# Patient Record
Sex: Female | Born: 1935
Health system: Southern US, Community
[De-identification: ages and names within clinical notes are randomized; demographics above are authoritative.]

## PROBLEM LIST (undated history)

## (undated) DIAGNOSIS — M353 Polymyalgia rheumatica: Secondary | ICD-10-CM

## (undated) DIAGNOSIS — IMO0001 Reserved for inherently not codable concepts without codable children: Secondary | ICD-10-CM

## (undated) DIAGNOSIS — I480 Paroxysmal atrial fibrillation: Secondary | ICD-10-CM

## (undated) DIAGNOSIS — I2510 Atherosclerotic heart disease of native coronary artery without angina pectoris: Secondary | ICD-10-CM

## (undated) DIAGNOSIS — D696 Thrombocytopenia, unspecified: Secondary | ICD-10-CM

## (undated) DIAGNOSIS — M4800 Spinal stenosis, site unspecified: Secondary | ICD-10-CM

## (undated) DIAGNOSIS — I10 Essential (primary) hypertension: Secondary | ICD-10-CM

## (undated) DIAGNOSIS — N952 Postmenopausal atrophic vaginitis: Secondary | ICD-10-CM

## (undated) DIAGNOSIS — M1990 Unspecified osteoarthritis, unspecified site: Secondary | ICD-10-CM

## (undated) DIAGNOSIS — I739 Peripheral vascular disease, unspecified: Secondary | ICD-10-CM

## (undated) DIAGNOSIS — K581 Irritable bowel syndrome with constipation: Secondary | ICD-10-CM

## (undated) DIAGNOSIS — F439 Reaction to severe stress, unspecified: Secondary | ICD-10-CM

## (undated) DIAGNOSIS — R413 Other amnesia: Secondary | ICD-10-CM

## (undated) DIAGNOSIS — E785 Hyperlipidemia, unspecified: Secondary | ICD-10-CM

## (undated) DIAGNOSIS — K219 Gastro-esophageal reflux disease without esophagitis: Secondary | ICD-10-CM

## (undated) DIAGNOSIS — M8580 Other specified disorders of bone density and structure, unspecified site: Secondary | ICD-10-CM

## (undated) DIAGNOSIS — I779 Disorder of arteries and arterioles, unspecified: Secondary | ICD-10-CM

## (undated) DIAGNOSIS — N840 Polyp of corpus uteri: Secondary | ICD-10-CM

## (undated) DIAGNOSIS — R002 Palpitations: Secondary | ICD-10-CM

## (undated) DIAGNOSIS — F419 Anxiety disorder, unspecified: Secondary | ICD-10-CM

## (undated) HISTORY — PX: BREAST SURGERY: SHX581

## (undated) HISTORY — DX: Polyp of corpus uteri: N84.0

## (undated) HISTORY — DX: Essential (primary) hypertension: I10

## (undated) HISTORY — DX: Unspecified osteoarthritis, unspecified site: M19.90

## (undated) HISTORY — DX: Postmenopausal atrophic vaginitis: N95.2

## (undated) HISTORY — PX: TONSILLECTOMY: SUR1361

## (undated) HISTORY — DX: Anxiety disorder, unspecified: F41.9

## (undated) HISTORY — DX: Palpitations: R00.2

## (undated) HISTORY — DX: Hyperlipidemia, unspecified: E78.5

## (undated) HISTORY — DX: Other specified disorders of bone density and structure, unspecified site: M85.80

## (undated) HISTORY — DX: Reserved for inherently not codable concepts without codable children: IMO0001

## (undated) HISTORY — DX: Gastro-esophageal reflux disease without esophagitis: K21.9

## (undated) HISTORY — DX: Atherosclerotic heart disease of native coronary artery without angina pectoris: I25.10

## (undated) HISTORY — DX: Spinal stenosis, site unspecified: M48.00

## (undated) HISTORY — PX: DILATION AND CURETTAGE OF UTERUS: SHX78

## (undated) HISTORY — DX: Thrombocytopenia, unspecified: D69.6

## (undated) HISTORY — DX: Polymyalgia rheumatica: M35.3

## (undated) HISTORY — DX: Paroxysmal atrial fibrillation: I48.0

## (undated) HISTORY — DX: Reaction to severe stress, unspecified: F43.9

## (undated) HISTORY — DX: Irritable bowel syndrome with constipation: K58.1

## (undated) HISTORY — DX: Peripheral vascular disease, unspecified: I73.9

## (undated) HISTORY — DX: Disorder of arteries and arterioles, unspecified: I77.9

---

## 1998-01-11 ENCOUNTER — Other Ambulatory Visit: Admission: RE | Admit: 1998-01-11 | Discharge: 1998-01-11 | Payer: Self-pay | Admitting: Obstetrics and Gynecology

## 1998-02-17 ENCOUNTER — Other Ambulatory Visit: Admission: RE | Admit: 1998-02-17 | Discharge: 1998-02-17 | Payer: Self-pay | Admitting: Obstetrics and Gynecology

## 1998-02-18 ENCOUNTER — Other Ambulatory Visit: Admission: RE | Admit: 1998-02-18 | Discharge: 1998-02-18 | Payer: Self-pay | Admitting: Obstetrics and Gynecology

## 1998-03-13 HISTORY — PX: EXCISION MORTON'S NEUROMA: SHX5013

## 1998-04-19 ENCOUNTER — Encounter: Admission: RE | Admit: 1998-04-19 | Discharge: 1998-04-19 | Payer: Self-pay | Admitting: Sports Medicine

## 1998-05-07 ENCOUNTER — Ambulatory Visit (HOSPITAL_COMMUNITY): Admission: RE | Admit: 1998-05-07 | Discharge: 1998-05-07 | Payer: Self-pay | Admitting: Obstetrics & Gynecology

## 1998-05-13 ENCOUNTER — Other Ambulatory Visit: Admission: RE | Admit: 1998-05-13 | Discharge: 1998-05-13 | Payer: Self-pay | Admitting: Obstetrics and Gynecology

## 1998-05-21 ENCOUNTER — Encounter: Admission: RE | Admit: 1998-05-21 | Discharge: 1998-05-21 | Payer: Self-pay | Admitting: Sports Medicine

## 1998-06-04 ENCOUNTER — Encounter: Admission: RE | Admit: 1998-06-04 | Discharge: 1998-06-04 | Payer: Self-pay | Admitting: Sports Medicine

## 1998-07-05 ENCOUNTER — Other Ambulatory Visit: Admission: RE | Admit: 1998-07-05 | Discharge: 1998-07-05 | Payer: Self-pay | Admitting: Orthopedic Surgery

## 1998-07-19 ENCOUNTER — Ambulatory Visit (HOSPITAL_COMMUNITY): Admission: RE | Admit: 1998-07-19 | Discharge: 1998-07-19 | Payer: Self-pay | Admitting: *Deleted

## 1998-08-03 ENCOUNTER — Encounter: Admission: RE | Admit: 1998-08-03 | Discharge: 1998-08-03 | Payer: Self-pay | Admitting: Sports Medicine

## 1998-09-29 ENCOUNTER — Ambulatory Visit (HOSPITAL_COMMUNITY): Admission: RE | Admit: 1998-09-29 | Discharge: 1998-09-29 | Payer: Self-pay | Admitting: Sports Medicine

## 1998-09-29 ENCOUNTER — Encounter: Payer: Self-pay | Admitting: Sports Medicine

## 1998-10-28 ENCOUNTER — Encounter: Payer: Self-pay | Admitting: Emergency Medicine

## 1998-10-28 ENCOUNTER — Emergency Department (HOSPITAL_COMMUNITY): Admission: EM | Admit: 1998-10-28 | Discharge: 1998-10-28 | Payer: Self-pay | Admitting: Emergency Medicine

## 1999-01-08 ENCOUNTER — Emergency Department (HOSPITAL_COMMUNITY): Admission: EM | Admit: 1999-01-08 | Discharge: 1999-01-08 | Payer: Self-pay | Admitting: Internal Medicine

## 1999-01-20 ENCOUNTER — Other Ambulatory Visit: Admission: RE | Admit: 1999-01-20 | Discharge: 1999-01-20 | Payer: Self-pay | Admitting: Obstetrics and Gynecology

## 1999-04-12 ENCOUNTER — Encounter: Admission: RE | Admit: 1999-04-12 | Discharge: 1999-04-12 | Payer: Self-pay | Admitting: Family Medicine

## 1999-09-20 ENCOUNTER — Ambulatory Visit (HOSPITAL_COMMUNITY): Admission: RE | Admit: 1999-09-20 | Discharge: 1999-09-20 | Payer: Self-pay | Admitting: Orthopedic Surgery

## 1999-09-20 ENCOUNTER — Encounter: Payer: Self-pay | Admitting: Orthopedic Surgery

## 1999-10-12 ENCOUNTER — Ambulatory Visit (HOSPITAL_COMMUNITY): Admission: RE | Admit: 1999-10-12 | Discharge: 1999-10-12 | Payer: Self-pay | Admitting: Orthopedic Surgery

## 1999-10-12 ENCOUNTER — Encounter: Payer: Self-pay | Admitting: Orthopedic Surgery

## 1999-10-26 ENCOUNTER — Encounter: Payer: Self-pay | Admitting: Orthopedic Surgery

## 1999-10-26 ENCOUNTER — Ambulatory Visit (HOSPITAL_COMMUNITY): Admission: RE | Admit: 1999-10-26 | Discharge: 1999-10-26 | Payer: Self-pay | Admitting: Orthopedic Surgery

## 2000-01-19 ENCOUNTER — Ambulatory Visit (HOSPITAL_COMMUNITY): Admission: RE | Admit: 2000-01-19 | Discharge: 2000-01-19 | Payer: Self-pay | Admitting: Neurosurgery

## 2000-01-26 ENCOUNTER — Other Ambulatory Visit: Admission: RE | Admit: 2000-01-26 | Discharge: 2000-01-26 | Payer: Self-pay | Admitting: Obstetrics and Gynecology

## 2000-04-13 ENCOUNTER — Encounter: Admission: RE | Admit: 2000-04-13 | Discharge: 2000-04-13 | Payer: Self-pay | Admitting: Family Medicine

## 2000-05-11 ENCOUNTER — Encounter: Admission: RE | Admit: 2000-05-11 | Discharge: 2000-05-11 | Payer: Self-pay | Admitting: Family Medicine

## 2000-05-11 ENCOUNTER — Encounter: Admission: RE | Admit: 2000-05-11 | Discharge: 2000-05-11 | Payer: Self-pay | Admitting: Sports Medicine

## 2000-06-29 ENCOUNTER — Encounter: Admission: RE | Admit: 2000-06-29 | Discharge: 2000-06-29 | Payer: Self-pay | Admitting: Sports Medicine

## 2000-08-10 ENCOUNTER — Encounter: Admission: RE | Admit: 2000-08-10 | Discharge: 2000-08-10 | Payer: Self-pay | Admitting: Sports Medicine

## 2000-10-09 ENCOUNTER — Encounter: Admission: RE | Admit: 2000-10-09 | Discharge: 2000-10-09 | Payer: Self-pay | Admitting: Family Medicine

## 2000-11-09 ENCOUNTER — Encounter: Admission: RE | Admit: 2000-11-09 | Discharge: 2000-11-09 | Payer: Self-pay | Admitting: Sports Medicine

## 2000-12-28 ENCOUNTER — Encounter: Admission: RE | Admit: 2000-12-28 | Discharge: 2000-12-28 | Payer: Self-pay | Admitting: Sports Medicine

## 2001-01-28 ENCOUNTER — Other Ambulatory Visit: Admission: RE | Admit: 2001-01-28 | Discharge: 2001-01-28 | Payer: Self-pay | Admitting: Obstetrics and Gynecology

## 2001-05-28 ENCOUNTER — Encounter: Admission: RE | Admit: 2001-05-28 | Discharge: 2001-05-28 | Payer: Self-pay | Admitting: Sports Medicine

## 2001-07-09 ENCOUNTER — Encounter: Admission: RE | Admit: 2001-07-09 | Discharge: 2001-07-09 | Payer: Self-pay | Admitting: Sports Medicine

## 2001-11-08 ENCOUNTER — Encounter: Admission: RE | Admit: 2001-11-08 | Discharge: 2001-11-08 | Payer: Self-pay | Admitting: Sports Medicine

## 2002-02-05 ENCOUNTER — Other Ambulatory Visit: Admission: RE | Admit: 2002-02-05 | Discharge: 2002-02-05 | Payer: Self-pay | Admitting: Obstetrics and Gynecology

## 2003-03-03 ENCOUNTER — Encounter: Admission: RE | Admit: 2003-03-03 | Discharge: 2003-03-03 | Payer: Self-pay | Admitting: Gastroenterology

## 2003-03-14 HISTORY — PX: HIP SURGERY: SHX245

## 2003-04-14 ENCOUNTER — Other Ambulatory Visit: Admission: RE | Admit: 2003-04-14 | Discharge: 2003-04-14 | Payer: Self-pay | Admitting: Obstetrics and Gynecology

## 2003-04-25 ENCOUNTER — Encounter: Admission: RE | Admit: 2003-04-25 | Discharge: 2003-04-25 | Payer: Self-pay | Admitting: Orthopedic Surgery

## 2003-06-17 ENCOUNTER — Observation Stay (HOSPITAL_COMMUNITY): Admission: RE | Admit: 2003-06-17 | Discharge: 2003-06-18 | Payer: Self-pay | Admitting: Orthopedic Surgery

## 2004-01-15 ENCOUNTER — Ambulatory Visit: Payer: Self-pay | Admitting: Cardiology

## 2004-01-26 ENCOUNTER — Ambulatory Visit: Payer: Self-pay | Admitting: Cardiology

## 2004-02-03 ENCOUNTER — Ambulatory Visit: Payer: Self-pay | Admitting: Cardiology

## 2004-02-12 ENCOUNTER — Ambulatory Visit: Payer: Self-pay | Admitting: Cardiology

## 2004-03-04 ENCOUNTER — Ambulatory Visit: Payer: Self-pay | Admitting: Cardiology

## 2004-03-08 ENCOUNTER — Ambulatory Visit: Payer: Self-pay | Admitting: Cardiology

## 2004-03-17 ENCOUNTER — Ambulatory Visit: Payer: Self-pay | Admitting: Internal Medicine

## 2004-03-28 ENCOUNTER — Ambulatory Visit: Payer: Self-pay | Admitting: *Deleted

## 2004-04-20 ENCOUNTER — Other Ambulatory Visit: Admission: RE | Admit: 2004-04-20 | Discharge: 2004-04-20 | Payer: Self-pay | Admitting: Obstetrics and Gynecology

## 2004-04-25 ENCOUNTER — Ambulatory Visit: Payer: Self-pay | Admitting: Cardiology

## 2004-05-05 ENCOUNTER — Ambulatory Visit: Payer: Self-pay | Admitting: Internal Medicine

## 2004-05-18 ENCOUNTER — Ambulatory Visit: Payer: Self-pay | Admitting: Cardiology

## 2004-06-01 ENCOUNTER — Ambulatory Visit: Payer: Self-pay | Admitting: Cardiology

## 2004-06-08 ENCOUNTER — Ambulatory Visit: Payer: Self-pay | Admitting: Cardiology

## 2004-06-09 ENCOUNTER — Ambulatory Visit: Payer: Self-pay | Admitting: Cardiology

## 2004-06-21 ENCOUNTER — Ambulatory Visit: Payer: Self-pay | Admitting: Cardiology

## 2004-06-29 ENCOUNTER — Ambulatory Visit: Payer: Self-pay | Admitting: Internal Medicine

## 2004-07-13 ENCOUNTER — Ambulatory Visit: Payer: Self-pay | Admitting: *Deleted

## 2004-07-28 ENCOUNTER — Ambulatory Visit: Payer: Self-pay | Admitting: Cardiology

## 2004-08-11 ENCOUNTER — Ambulatory Visit: Payer: Self-pay | Admitting: Internal Medicine

## 2004-08-24 ENCOUNTER — Ambulatory Visit: Payer: Self-pay | Admitting: *Deleted

## 2004-09-07 ENCOUNTER — Ambulatory Visit: Payer: Self-pay | Admitting: Cardiovascular Disease

## 2004-09-16 ENCOUNTER — Ambulatory Visit: Payer: Self-pay | Admitting: Cardiology

## 2004-10-03 ENCOUNTER — Ambulatory Visit: Payer: Self-pay | Admitting: Internal Medicine

## 2004-10-15 ENCOUNTER — Emergency Department (HOSPITAL_COMMUNITY): Admission: EM | Admit: 2004-10-15 | Discharge: 2004-10-15 | Payer: Self-pay | Admitting: Emergency Medicine

## 2004-10-24 ENCOUNTER — Ambulatory Visit: Payer: Self-pay | Admitting: Internal Medicine

## 2004-11-16 ENCOUNTER — Ambulatory Visit: Payer: Self-pay | Admitting: Cardiology

## 2004-11-29 ENCOUNTER — Encounter: Admission: RE | Admit: 2004-11-29 | Discharge: 2004-12-26 | Payer: Self-pay | Admitting: *Deleted

## 2004-11-30 ENCOUNTER — Ambulatory Visit: Payer: Self-pay | Admitting: Cardiology

## 2004-12-21 ENCOUNTER — Ambulatory Visit: Payer: Self-pay | Admitting: Cardiology

## 2004-12-28 ENCOUNTER — Ambulatory Visit: Payer: Self-pay | Admitting: Cardiovascular Disease

## 2005-01-12 ENCOUNTER — Ambulatory Visit: Payer: Self-pay | Admitting: Cardiology

## 2005-02-07 ENCOUNTER — Ambulatory Visit (HOSPITAL_COMMUNITY): Admission: RE | Admit: 2005-02-07 | Discharge: 2005-02-07 | Payer: Self-pay | Admitting: Obstetrics and Gynecology

## 2005-02-07 ENCOUNTER — Ambulatory Visit (HOSPITAL_BASED_OUTPATIENT_CLINIC_OR_DEPARTMENT_OTHER): Admission: RE | Admit: 2005-02-07 | Discharge: 2005-02-07 | Payer: Self-pay | Admitting: Obstetrics and Gynecology

## 2005-02-07 ENCOUNTER — Encounter (INDEPENDENT_AMBULATORY_CARE_PROVIDER_SITE_OTHER): Payer: Self-pay | Admitting: *Deleted

## 2005-02-07 HISTORY — PX: HYSTEROSCOPY: SHX211

## 2005-02-14 ENCOUNTER — Ambulatory Visit: Payer: Self-pay | Admitting: Cardiovascular Disease

## 2005-02-21 ENCOUNTER — Ambulatory Visit: Payer: Self-pay | Admitting: Cardiology

## 2005-03-07 ENCOUNTER — Ambulatory Visit: Payer: Self-pay | Admitting: Internal Medicine

## 2005-03-21 ENCOUNTER — Ambulatory Visit: Payer: Self-pay | Admitting: Cardiology

## 2005-04-04 ENCOUNTER — Ambulatory Visit: Payer: Self-pay | Admitting: Cardiology

## 2005-04-18 ENCOUNTER — Ambulatory Visit: Payer: Self-pay | Admitting: Internal Medicine

## 2005-05-02 ENCOUNTER — Ambulatory Visit: Payer: Self-pay | Admitting: Cardiology

## 2005-05-16 ENCOUNTER — Ambulatory Visit: Payer: Self-pay | Admitting: Cardiology

## 2005-06-06 ENCOUNTER — Ambulatory Visit: Payer: Self-pay | Admitting: Cardiology

## 2005-06-27 ENCOUNTER — Ambulatory Visit: Payer: Self-pay | Admitting: Cardiology

## 2005-07-18 ENCOUNTER — Ambulatory Visit: Payer: Self-pay | Admitting: Cardiology

## 2005-08-01 ENCOUNTER — Ambulatory Visit: Payer: Self-pay | Admitting: *Deleted

## 2005-08-22 ENCOUNTER — Ambulatory Visit: Payer: Self-pay | Admitting: *Deleted

## 2005-09-05 ENCOUNTER — Ambulatory Visit: Payer: Self-pay | Admitting: Cardiology

## 2005-09-22 ENCOUNTER — Ambulatory Visit: Payer: Self-pay | Admitting: Internal Medicine

## 2005-10-20 ENCOUNTER — Ambulatory Visit: Payer: Self-pay | Admitting: Internal Medicine

## 2005-11-17 ENCOUNTER — Ambulatory Visit: Payer: Self-pay | Admitting: Cardiology

## 2005-11-27 ENCOUNTER — Ambulatory Visit: Payer: Self-pay | Admitting: Cardiovascular Disease

## 2005-12-12 ENCOUNTER — Ambulatory Visit: Payer: Self-pay | Admitting: Cardiovascular Disease

## 2005-12-14 ENCOUNTER — Ambulatory Visit: Payer: Self-pay | Admitting: Cardiology

## 2005-12-22 ENCOUNTER — Ambulatory Visit: Payer: Self-pay | Admitting: Cardiology

## 2006-01-05 ENCOUNTER — Ambulatory Visit: Payer: Self-pay | Admitting: Internal Medicine

## 2006-01-19 ENCOUNTER — Ambulatory Visit: Payer: Self-pay | Admitting: Cardiology

## 2006-02-07 ENCOUNTER — Ambulatory Visit: Payer: Self-pay | Admitting: Cardiovascular Disease

## 2006-03-08 ENCOUNTER — Ambulatory Visit: Payer: Self-pay | Admitting: Internal Medicine

## 2006-03-22 ENCOUNTER — Ambulatory Visit: Payer: Self-pay | Admitting: Cardiology

## 2006-04-10 ENCOUNTER — Ambulatory Visit: Payer: Self-pay | Admitting: Cardiology

## 2006-04-30 ENCOUNTER — Other Ambulatory Visit: Admission: RE | Admit: 2006-04-30 | Discharge: 2006-04-30 | Payer: Self-pay | Admitting: Obstetrics and Gynecology

## 2006-05-08 ENCOUNTER — Ambulatory Visit: Payer: Self-pay | Admitting: Internal Medicine

## 2006-05-17 ENCOUNTER — Ambulatory Visit: Payer: Self-pay | Admitting: Cardiology

## 2006-05-31 ENCOUNTER — Ambulatory Visit: Payer: Self-pay | Admitting: Cardiology

## 2006-06-15 ENCOUNTER — Ambulatory Visit: Payer: Self-pay | Admitting: Cardiology

## 2006-07-12 ENCOUNTER — Ambulatory Visit: Payer: Self-pay | Admitting: Internal Medicine

## 2006-08-09 ENCOUNTER — Ambulatory Visit: Payer: Self-pay | Admitting: Cardiology

## 2006-08-22 ENCOUNTER — Ambulatory Visit: Payer: Self-pay | Admitting: Cardiology

## 2006-09-06 ENCOUNTER — Ambulatory Visit: Payer: Self-pay | Admitting: Cardiology

## 2006-09-20 ENCOUNTER — Ambulatory Visit: Payer: Self-pay | Admitting: Internal Medicine

## 2006-09-23 ENCOUNTER — Emergency Department (HOSPITAL_COMMUNITY): Admission: EM | Admit: 2006-09-23 | Discharge: 2006-09-23 | Payer: Self-pay | Admitting: Emergency Medicine

## 2006-10-02 ENCOUNTER — Telehealth: Payer: Self-pay | Admitting: *Deleted

## 2006-10-03 ENCOUNTER — Encounter: Payer: Self-pay | Admitting: *Deleted

## 2006-10-03 ENCOUNTER — Encounter (INDEPENDENT_AMBULATORY_CARE_PROVIDER_SITE_OTHER): Payer: Self-pay | Admitting: *Deleted

## 2006-10-16 ENCOUNTER — Ambulatory Visit: Payer: Self-pay | Admitting: Cardiology

## 2006-10-16 LAB — CONVERTED CEMR LAB
INR: 6.4 (ref 0.9–2.0)
Prothrombin Time: 33.1 s (ref 10.0–14.0)

## 2006-10-23 ENCOUNTER — Ambulatory Visit: Payer: Self-pay | Admitting: Cardiology

## 2006-11-05 ENCOUNTER — Ambulatory Visit: Payer: Self-pay | Admitting: Cardiology

## 2006-11-25 ENCOUNTER — Emergency Department (HOSPITAL_COMMUNITY): Admission: EM | Admit: 2006-11-25 | Discharge: 2006-11-25 | Payer: Self-pay | Admitting: Emergency Medicine

## 2006-11-26 ENCOUNTER — Ambulatory Visit: Payer: Self-pay | Admitting: Internal Medicine

## 2006-11-28 ENCOUNTER — Inpatient Hospital Stay (HOSPITAL_COMMUNITY): Admission: AD | Admit: 2006-11-28 | Discharge: 2006-12-01 | Payer: Self-pay | Admitting: Cardiology

## 2006-11-28 ENCOUNTER — Ambulatory Visit: Payer: Self-pay | Admitting: Internal Medicine

## 2006-11-28 ENCOUNTER — Ambulatory Visit: Payer: Self-pay | Admitting: Cardiology

## 2006-11-29 ENCOUNTER — Encounter: Payer: Self-pay | Admitting: Cardiology

## 2006-12-05 ENCOUNTER — Ambulatory Visit: Payer: Self-pay | Admitting: Internal Medicine

## 2006-12-19 ENCOUNTER — Ambulatory Visit: Payer: Self-pay | Admitting: Internal Medicine

## 2006-12-27 ENCOUNTER — Ambulatory Visit (HOSPITAL_COMMUNITY): Admission: RE | Admit: 2006-12-27 | Discharge: 2006-12-27 | Payer: Self-pay | Admitting: Neurosurgery

## 2007-01-03 ENCOUNTER — Ambulatory Visit: Payer: Self-pay | Admitting: Cardiovascular Disease

## 2007-01-11 ENCOUNTER — Ambulatory Visit: Payer: Self-pay | Admitting: Cardiology

## 2007-02-01 ENCOUNTER — Ambulatory Visit: Payer: Self-pay | Admitting: Internal Medicine

## 2007-03-01 ENCOUNTER — Ambulatory Visit: Payer: Self-pay | Admitting: Internal Medicine

## 2007-03-14 HISTORY — PX: OTHER SURGICAL HISTORY: SHX169

## 2007-03-22 ENCOUNTER — Ambulatory Visit: Payer: Self-pay | Admitting: Cardiology

## 2007-04-03 ENCOUNTER — Encounter: Admission: RE | Admit: 2007-04-03 | Discharge: 2007-07-02 | Payer: Self-pay | Admitting: Family Medicine

## 2007-04-19 ENCOUNTER — Ambulatory Visit: Payer: Self-pay | Admitting: Internal Medicine

## 2007-05-09 ENCOUNTER — Ambulatory Visit: Payer: Self-pay | Admitting: Cardiology

## 2007-05-12 HISTORY — PX: LAMINECTOMY: SHX219

## 2007-05-16 ENCOUNTER — Ambulatory Visit: Payer: Self-pay | Admitting: Cardiovascular Disease

## 2007-05-29 ENCOUNTER — Inpatient Hospital Stay (HOSPITAL_COMMUNITY): Admission: RE | Admit: 2007-05-29 | Discharge: 2007-05-30 | Payer: Self-pay | Admitting: Neurosurgery

## 2007-06-01 ENCOUNTER — Emergency Department (HOSPITAL_COMMUNITY): Admission: EM | Admit: 2007-06-01 | Discharge: 2007-06-01 | Payer: Self-pay | Admitting: Emergency Medicine

## 2007-06-17 ENCOUNTER — Ambulatory Visit: Payer: Self-pay | Admitting: Internal Medicine

## 2007-06-27 ENCOUNTER — Ambulatory Visit: Payer: Self-pay | Admitting: Cardiology

## 2007-07-16 ENCOUNTER — Ambulatory Visit: Payer: Self-pay | Admitting: Cardiovascular Disease

## 2007-07-30 ENCOUNTER — Ambulatory Visit: Payer: Self-pay | Admitting: Internal Medicine

## 2007-08-13 ENCOUNTER — Encounter (INDEPENDENT_AMBULATORY_CARE_PROVIDER_SITE_OTHER): Payer: Self-pay | Admitting: Gastroenterology

## 2007-08-13 ENCOUNTER — Ambulatory Visit (HOSPITAL_COMMUNITY): Admission: RE | Admit: 2007-08-13 | Discharge: 2007-08-13 | Payer: Self-pay | Admitting: Gastroenterology

## 2007-08-20 ENCOUNTER — Ambulatory Visit: Payer: Self-pay | Admitting: Cardiovascular Disease

## 2007-08-30 ENCOUNTER — Ambulatory Visit: Payer: Self-pay | Admitting: Internal Medicine

## 2007-09-20 ENCOUNTER — Ambulatory Visit: Payer: Self-pay | Admitting: Cardiology

## 2007-10-18 ENCOUNTER — Ambulatory Visit: Payer: Self-pay | Admitting: Cardiology

## 2007-11-15 ENCOUNTER — Ambulatory Visit: Payer: Self-pay | Admitting: Cardiovascular Disease

## 2007-12-03 ENCOUNTER — Encounter: Admission: RE | Admit: 2007-12-03 | Discharge: 2008-01-21 | Payer: Self-pay | Admitting: Neurosurgery

## 2007-12-06 ENCOUNTER — Ambulatory Visit: Payer: Self-pay | Admitting: Cardiology

## 2007-12-30 ENCOUNTER — Ambulatory Visit: Payer: Self-pay | Admitting: Cardiovascular Disease

## 2008-01-24 ENCOUNTER — Encounter: Admission: RE | Admit: 2008-01-24 | Discharge: 2008-01-24 | Payer: Self-pay | Admitting: Gastroenterology

## 2008-01-27 ENCOUNTER — Ambulatory Visit: Payer: Self-pay | Admitting: Cardiology

## 2008-02-24 ENCOUNTER — Ambulatory Visit: Payer: Self-pay | Admitting: Internal Medicine

## 2008-03-23 ENCOUNTER — Ambulatory Visit: Payer: Self-pay | Admitting: Internal Medicine

## 2008-04-02 ENCOUNTER — Ambulatory Visit: Payer: Self-pay | Admitting: Cardiology

## 2008-04-08 ENCOUNTER — Ambulatory Visit: Payer: Self-pay

## 2008-04-18 ENCOUNTER — Ambulatory Visit: Payer: Self-pay | Admitting: Cardiovascular Disease

## 2008-04-18 ENCOUNTER — Observation Stay (HOSPITAL_COMMUNITY): Admission: EM | Admit: 2008-04-18 | Discharge: 2008-04-19 | Payer: Self-pay | Admitting: Emergency Medicine

## 2008-04-27 ENCOUNTER — Ambulatory Visit: Payer: Self-pay | Admitting: Cardiology

## 2008-04-28 ENCOUNTER — Ambulatory Visit: Payer: Self-pay | Admitting: Cardiology

## 2008-05-19 ENCOUNTER — Other Ambulatory Visit: Admission: RE | Admit: 2008-05-19 | Discharge: 2008-05-19 | Payer: Self-pay | Admitting: Obstetrics and Gynecology

## 2008-05-19 ENCOUNTER — Encounter: Payer: Self-pay | Admitting: Obstetrics and Gynecology

## 2008-05-19 ENCOUNTER — Ambulatory Visit: Payer: Self-pay | Admitting: Obstetrics and Gynecology

## 2008-05-25 ENCOUNTER — Ambulatory Visit: Payer: Self-pay | Admitting: Cardiovascular Disease

## 2008-06-15 ENCOUNTER — Ambulatory Visit: Payer: Self-pay | Admitting: Cardiology

## 2008-06-23 ENCOUNTER — Encounter: Payer: Self-pay | Admitting: Cardiology

## 2008-06-23 ENCOUNTER — Ambulatory Visit: Payer: Self-pay | Admitting: Cardiology

## 2008-07-13 ENCOUNTER — Ambulatory Visit: Payer: Self-pay | Admitting: Internal Medicine

## 2008-08-02 IMAGING — CR DG CHEST 2V
2 series · 2 of 2 positions shown · non-contrast
Comparison: none

CLINICAL DATA: Unstable angina.  Palpitations.
 CHEST - 2 VIEW:

[w chest pa]
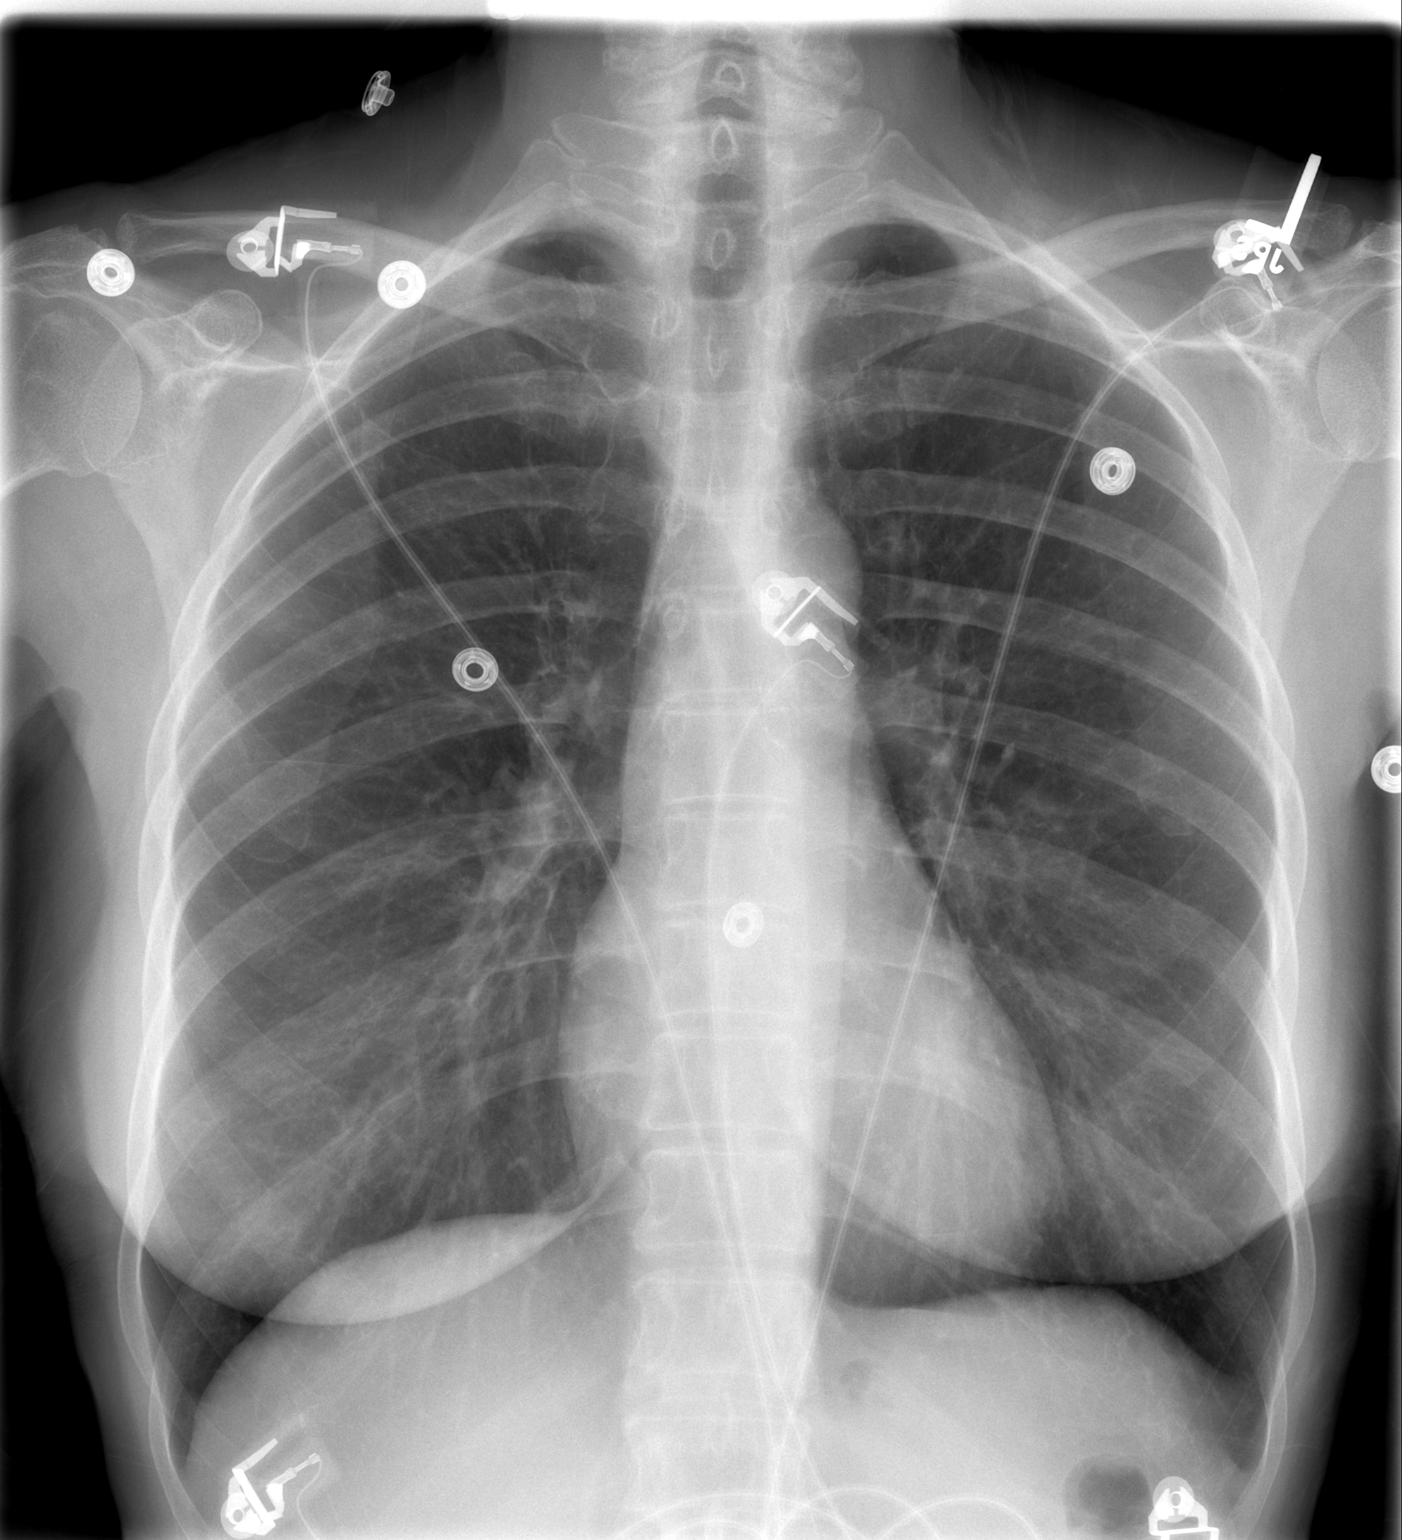

[w chest lat]
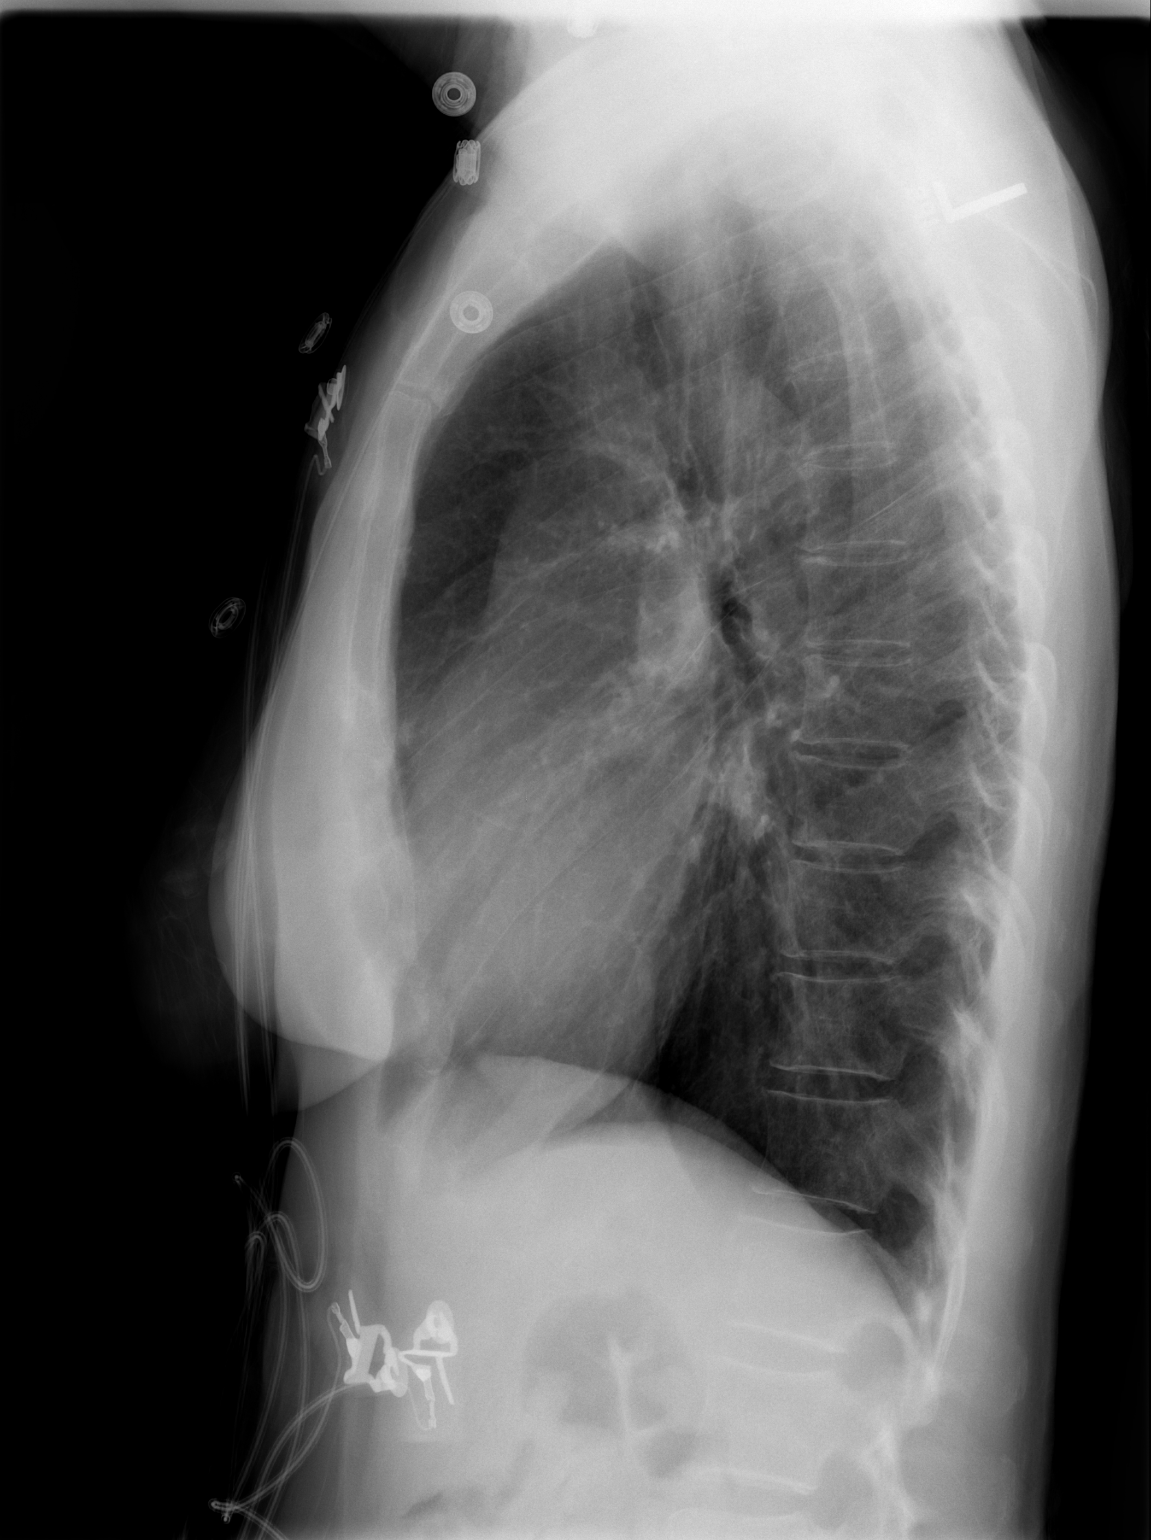

[2 of 2 positions shown; findings below may reference images not displayed]

FINDINGS: The heart size and mediastinal contours are within normal limits.  Both lungs are clear.  The visualized skeletal structures are unremarkable.
IMPRESSION: No active cardiopulmonary disease.

## 2008-08-11 ENCOUNTER — Encounter: Payer: Self-pay | Admitting: *Deleted

## 2008-08-11 ENCOUNTER — Ambulatory Visit: Payer: Self-pay | Admitting: Cardiovascular Disease

## 2008-08-11 LAB — CONVERTED CEMR LAB
POC INR: 1.5
Protime: 14.9

## 2008-08-19 ENCOUNTER — Ambulatory Visit: Payer: Self-pay | Admitting: Cardiology

## 2008-08-25 ENCOUNTER — Ambulatory Visit: Payer: Self-pay | Admitting: Internal Medicine

## 2008-08-25 ENCOUNTER — Encounter (INDEPENDENT_AMBULATORY_CARE_PROVIDER_SITE_OTHER): Payer: Self-pay | Admitting: Cardiology

## 2008-08-25 LAB — CONVERTED CEMR LAB
POC INR: 2.6
Protime: 19.5

## 2008-09-16 ENCOUNTER — Encounter: Payer: Self-pay | Admitting: *Deleted

## 2008-09-22 ENCOUNTER — Ambulatory Visit: Payer: Self-pay | Admitting: Cardiovascular Disease

## 2008-09-22 ENCOUNTER — Encounter (INDEPENDENT_AMBULATORY_CARE_PROVIDER_SITE_OTHER): Payer: Self-pay | Admitting: Cardiology

## 2008-09-22 LAB — CONVERTED CEMR LAB
POC INR: 1.2
Prothrombin Time: 13.6 s

## 2008-10-02 ENCOUNTER — Ambulatory Visit: Payer: Self-pay | Admitting: Internal Medicine

## 2008-10-02 LAB — CONVERTED CEMR LAB
POC INR: 3.4
Prothrombin Time: 22.3 s

## 2008-10-16 ENCOUNTER — Ambulatory Visit: Payer: Self-pay | Admitting: Cardiology

## 2008-10-16 LAB — CONVERTED CEMR LAB
POC INR: 2.4
Prothrombin Time: 19 s

## 2008-11-06 ENCOUNTER — Ambulatory Visit: Payer: Self-pay | Admitting: Internal Medicine

## 2008-11-06 LAB — CONVERTED CEMR LAB: POC INR: 2.5

## 2008-12-04 ENCOUNTER — Ambulatory Visit: Payer: Self-pay | Admitting: Internal Medicine

## 2008-12-04 LAB — CONVERTED CEMR LAB: POC INR: 2.8

## 2008-12-10 ENCOUNTER — Telehealth: Payer: Self-pay | Admitting: Internal Medicine

## 2009-01-04 ENCOUNTER — Ambulatory Visit: Payer: Self-pay | Admitting: Cardiology

## 2009-01-04 LAB — CONVERTED CEMR LAB: POC INR: 3.6

## 2009-01-25 ENCOUNTER — Ambulatory Visit: Payer: Self-pay | Admitting: Internal Medicine

## 2009-01-25 LAB — CONVERTED CEMR LAB: POC INR: 2.2

## 2009-02-17 ENCOUNTER — Ambulatory Visit: Payer: Self-pay | Admitting: Obstetrics and Gynecology

## 2009-02-22 ENCOUNTER — Ambulatory Visit: Payer: Self-pay | Admitting: Cardiology

## 2009-02-22 LAB — CONVERTED CEMR LAB: POC INR: 2

## 2009-03-22 ENCOUNTER — Ambulatory Visit: Payer: Self-pay | Admitting: Cardiology

## 2009-03-22 LAB — CONVERTED CEMR LAB: POC INR: 2.5

## 2009-04-19 ENCOUNTER — Ambulatory Visit: Payer: Self-pay | Admitting: Cardiology

## 2009-04-19 LAB — CONVERTED CEMR LAB: POC INR: 4.1

## 2009-05-03 ENCOUNTER — Ambulatory Visit: Payer: Self-pay | Admitting: Cardiovascular Disease

## 2009-05-03 ENCOUNTER — Encounter (INDEPENDENT_AMBULATORY_CARE_PROVIDER_SITE_OTHER): Payer: Self-pay | Admitting: Cardiology

## 2009-05-03 LAB — CONVERTED CEMR LAB: POC INR: 3.4

## 2009-05-20 ENCOUNTER — Ambulatory Visit: Payer: Self-pay | Admitting: Obstetrics and Gynecology

## 2009-05-31 ENCOUNTER — Ambulatory Visit: Payer: Self-pay | Admitting: Internal Medicine

## 2009-05-31 LAB — CONVERTED CEMR LAB: POC INR: 3.7

## 2009-06-21 ENCOUNTER — Ambulatory Visit: Payer: Self-pay | Admitting: Internal Medicine

## 2009-06-21 LAB — CONVERTED CEMR LAB: POC INR: 1.5

## 2009-07-05 ENCOUNTER — Ambulatory Visit: Payer: Self-pay | Admitting: Cardiology

## 2009-07-05 LAB — CONVERTED CEMR LAB: POC INR: 3

## 2009-07-26 ENCOUNTER — Ambulatory Visit: Payer: Self-pay | Admitting: Internal Medicine

## 2009-07-26 LAB — CONVERTED CEMR LAB: POC INR: 3.1

## 2009-08-17 ENCOUNTER — Ambulatory Visit: Payer: Self-pay | Admitting: Cardiology

## 2009-08-17 LAB — CONVERTED CEMR LAB: POC INR: 2

## 2009-08-25 ENCOUNTER — Encounter: Payer: Self-pay | Admitting: Cardiology

## 2009-08-26 ENCOUNTER — Ambulatory Visit: Payer: Self-pay | Admitting: Cardiology

## 2009-08-30 ENCOUNTER — Telehealth: Payer: Self-pay | Admitting: Cardiology

## 2009-08-30 LAB — CONVERTED CEMR LAB
Cholesterol: 212 mg/dL — ABNORMAL HIGH (ref 0–200)
Direct LDL: 119.7 mg/dL
HDL: 59 mg/dL (ref >39.00)
Total CHOL/HDL Ratio: 4
Triglycerides: 55 mg/dL (ref 0.0–149.0)
VLDL: 11 mg/dL (ref 0.0–40.0)

## 2009-08-31 ENCOUNTER — Ambulatory Visit: Payer: Self-pay | Admitting: Internal Medicine

## 2009-08-31 LAB — CONVERTED CEMR LAB: POC INR: 3.1

## 2009-09-17 ENCOUNTER — Encounter: Payer: Self-pay | Admitting: Cardiology

## 2009-09-17 ENCOUNTER — Ambulatory Visit: Payer: Self-pay

## 2009-09-21 ENCOUNTER — Ambulatory Visit: Payer: Self-pay | Admitting: Cardiology

## 2009-09-21 LAB — CONVERTED CEMR LAB: POC INR: 2.6

## 2009-10-19 ENCOUNTER — Ambulatory Visit: Payer: Self-pay | Admitting: Cardiology

## 2009-10-19 LAB — CONVERTED CEMR LAB: POC INR: 4.2

## 2009-10-29 ENCOUNTER — Ambulatory Visit: Payer: Self-pay | Admitting: Cardiology

## 2009-10-29 LAB — CONVERTED CEMR LAB: POC INR: 3.1

## 2009-11-16 ENCOUNTER — Ambulatory Visit: Payer: Self-pay | Admitting: Internal Medicine

## 2009-11-16 LAB — CONVERTED CEMR LAB: POC INR: 3.7

## 2009-11-23 ENCOUNTER — Ambulatory Visit: Payer: Self-pay | Admitting: Cardiology

## 2009-11-25 ENCOUNTER — Encounter: Payer: Self-pay | Admitting: Cardiology

## 2009-11-25 LAB — CONVERTED CEMR LAB
ALT: 17 units/L (ref 0–35)
AST: 22 units/L (ref 0–37)
Bilirubin, Direct: 0.2 mg/dL (ref 0.0–0.3)
LDL Cholesterol: 80 mg/dL (ref 0–99)
Total Bilirubin: 0.9 mg/dL (ref 0.3–1.2)
Total CHOL/HDL Ratio: 3
Triglycerides: 50 mg/dL (ref 0.0–149.0)

## 2009-12-15 ENCOUNTER — Ambulatory Visit: Payer: Self-pay | Admitting: Cardiovascular Disease

## 2010-01-04 ENCOUNTER — Ambulatory Visit: Payer: Self-pay | Admitting: Cardiovascular Disease

## 2010-01-04 LAB — CONVERTED CEMR LAB: POC INR: 3.3

## 2010-01-25 ENCOUNTER — Ambulatory Visit: Payer: Self-pay | Admitting: Cardiovascular Disease

## 2010-01-25 LAB — CONVERTED CEMR LAB: POC INR: 2.1

## 2010-02-21 ENCOUNTER — Ambulatory Visit: Payer: Self-pay | Admitting: Cardiology

## 2010-03-21 ENCOUNTER — Ambulatory Visit: Admission: RE | Admit: 2010-03-21 | Discharge: 2010-03-21 | Payer: Self-pay | Source: Home / Self Care

## 2010-03-21 LAB — CONVERTED CEMR LAB: POC INR: 2.7

## 2010-04-03 ENCOUNTER — Encounter: Payer: Self-pay | Admitting: Gastroenterology

## 2010-04-12 NOTE — Progress Notes (Signed)
Summary: returning call  Phone Note Call from Patient Call back at Home Phone [phone]   Caller: Patient Reason for Call: Talk to Nurse Summary of Call: returning call Initial call taken by: Migdalia Dk,  August 30, 2009 3:01 PM  Follow-up for Phone Call        pt aware of results and new rx for simvastatin called in, will recheck in 8 weeks, when pt receives reminder letter she will call to sch labs Meredith Staggers, RN  August 30, 2009 4:56 PM     New/Updated Medications: SIMVASTATIN 20 MG TABS (SIMVASTATIN) Take one tablet by mouth daily at bedtime Prescriptions: SIMVASTATIN 20 MG TABS (SIMVASTATIN) Take one tablet by mouth daily at bedtime  #30 x 6   Entered by:   Meredith Staggers, RN   Authorized by:   Talitha Givens, MD, [facility]   Signed by:   Meredith Staggers, RN on 08/30/2009   Method used:   Electronically to        [facility]. [phone]* (retail)       [address]       [place], [place]  [address]       Ph: [phone]       Fax: [phone]   RxID:   [id number]

## 2010-04-12 NOTE — Medication Information (Signed)
Summary: [name]  Anticoagulant Therapy  Managed by: Weston Brass, PharmD Referring MD: Willa Rough MD PCP: DR Salome Holmes MD: Shirlee Latch MD, Freida Busman Indication 1: Atrial Fibrillation (ICD-427.31) Lab Used: [facility] Site: [facility] INR POC 3.1 INR RANGE 2 - 3  Dietary changes: yes       Details: Pt has been eating more cabbage and spinach this week   Health status changes: no    Bleeding/hemorrhagic complications: no    Recent/future hospitalizations: no    Any changes in medication regimen? no    Recent/future dental: no  Any missed doses?: no       Is patient compliant with meds? yes       Allergies: No Known Drug Allergies  Anticoagulation Management History:      The patient is taking warfarin and comes in today for a routine follow up visit.  Positive risk factors for bleeding include an age of 75 years or older.  The bleeding index is 'intermediate risk'.  Positive CHADS2 values include History of HTN.  Negative CHADS2 values include Age > 75 years old.  The start date was 01/23/2003.  Her last INR was 6.4 RATIO.  Anticoagulation responsible provider: Shirlee Latch MD, [place].  INR POC: 3.1.  Cuvette Lot#: [id number].  Exp: 12/2010.    Anticoagulation Management Assessment/Plan:      The patient's current anticoagulation dose is Warfarin sodium 5 mg tabs: Take as directed by coumadin clinic..  The target INR is 2 - 3.  The next INR is due 11/16/2009.  Anticoagulation instructions were given to patient.  Results were reviewed/authorized by Weston Brass, PharmD.  She was notified by Gweneth Fritter, PharmD Candidate.         Prior Anticoagulation Instructions: INR 4.2  Skip today's dose.  Tomorrow, take 1 tablet (5mg ).  Then resume normal schedule of taking 1.5 tablets (7.5mg ) daily except take 1 tablet (5mg ) on Monday.  Recheck in 2 weeks.     Current Anticoagulation Instructions: INR 3.1  Today, August 19th, take 1 tablet (5mg ).  Then resume normal schedule of taking  1.5 tablets (7.5mg ) every day except take 1 tablet (5mg ) on Mondays. Recheck in 2 weeks.

## 2010-04-12 NOTE — Medication Information (Signed)
Summary: rov/ewj  Anticoagulant Therapy  Managed by: Weston Brass, PharmD Referring MD: Willa Rough MD PCP: Carlota Raspberry MD Supervising MD: Jens Som MD, Arlys John Indication 1: Atrial Fibrillation (ICD-427.31) Lab Used: [facility] Site: [facility] INR POC 3.0 INR RANGE 2 - 3  Dietary changes: yes       Details: did not eat as many greens  Health status changes: no    Bleeding/hemorrhagic complications: no    Recent/future hospitalizations: no    Any changes in medication regimen? no    Recent/future dental: no  Any missed doses?: no       Is patient compliant with meds? yes       Allergies: No Known Drug Allergies  Anticoagulation Management History:      Positive risk factors for bleeding include an age of 14 years or older.  The bleeding index is 'intermediate risk'.  Positive CHADS2 values include History of HTN.  Negative CHADS2 values include Age > 50 years old.  The start date was 01/23/2003.  Her last INR was 6.4 RATIO.  Anticoagulation responsible provider: Jens Som MD, Arlys John.  INR POC: 3.0.  Exp: 07/2010.    Anticoagulation Management Assessment/Plan:      The patient's current anticoagulation dose is Warfarin sodium 5 mg tabs: Take as directed by coumadin clinic..  The target INR is 2 - 3.  The next INR is due 07/26/2009.  Anticoagulation instructions were given to patient.  Results were reviewed/authorized by Weston Brass, PharmD.  She was notified by Weston Brass PharmD.         Prior Anticoagulation Instructions: INR 1.5  Start taking 1.5 tablets daily except 1 tablet on Thursdays.  Recheck in 2 weeks.    Current Anticoagulation Instructions: INR 3.0  Continue same dose of 1 1/2 tablets every day except 1 tablet on Thursday.  Try to eat 2-3 servings of green vegetables a week.

## 2010-04-12 NOTE — Letter (Signed)
Summary: Lipid Reminder  [facility], Main Office  [address]   [place], [place] [address]   Phone: [phone]  Fax: [phone]        October 19, 2009 MRN: [id number]    [name] [address] [place], [place]  [address]    Dear [name],  Our records indicate it is time to check your cholesterol, due to your recent medication change.  Please call our office to schedule an appt for labwork.  Please remember it is a fasting lab.     Sincerely,  Meredith Staggers, RN Willa Rough, MD This letter has been electronically signed by your physician.

## 2010-04-12 NOTE — Medication Information (Signed)
Summary: rov/tm  Anticoagulant Therapy  Managed by: Weston Brass, PharmD Referring MD: Willa Rough MD PCP: DR Salome Holmes MD: Clifton James MD, Cristal Deer Indication 1: Atrial Fibrillation (ICD-427.31) Lab Used: [facility] Site: [facility] INR POC 1.5 INR RANGE 2 - 3  Dietary changes: yes       Details: on vacation for past week; diet slightly different  Health status changes: no    Bleeding/hemorrhagic complications: no    Recent/future hospitalizations: no    Any changes in medication regimen? no    Recent/future dental: no  Any missed doses?: no       Is patient compliant with meds? yes      Comments: Requested pt return in 2 weeks.  Will be out of town again until the 25th.   Allergies: No Known Drug Allergies  Anticoagulation Management History:      The patient is taking warfarin and comes in today for a routine follow up visit.  Positive risk factors for bleeding include an age of 75 years or older.  The bleeding index is 'intermediate risk'.  Positive CHADS2 values include History of HTN.  Negative CHADS2 values include Age > 56 years old.  The start date was 01/23/2003.  Her last INR was 6.4 RATIO.  Anticoagulation responsible provider: Clifton James MD, Cristal Deer.  INR POC: 1.5.  Exp: 01/2011.    Anticoagulation Management Assessment/Plan:      The patient's current anticoagulation dose is Warfarin sodium 5 mg tabs: Take as directed by coumadin clinic..  The target INR is 2 - 3.  The next INR is due 01/04/2010.  Anticoagulation instructions were given to patient.  Results were reviewed/authorized by Weston Brass, PharmD.  She was notified by Weston Brass PharmD.         Prior Anticoagulation Instructions: INR 2.6 Continue 7.5mg s daily except 5mg s on Mondays and Wednesdays.  Recheck in 2 weeks.   Current Anticoagulation Instructions: INR 1.5  Take 2 tablets today then increase dose to 1 1/2 tablets every day except 1 tablet on Monday.  Recheck INR in 2  weeks.

## 2010-04-12 NOTE — Medication Information (Signed)
Summary: rov/sp  Anticoagulant Therapy  Managed by: Bethena Midget, RN, BSN Referring MD: Willa Rough MD PCP: Carlota Raspberry MD Supervising MD: Eden Emms MD, Theron Arista Indication 1: Atrial Fibrillation (ICD-427.31) Lab Used: [facility] Site: [facility] INR POC 2.0 INR RANGE 2 - 3  Dietary changes: no    Health status changes: no    Bleeding/hemorrhagic complications: no    Recent/future hospitalizations: no    Any changes in medication regimen? no    Recent/future dental: no  Any missed doses?: no       Is patient compliant with meds? yes       Allergies: No Known Drug Allergies  Anticoagulation Management History:      The patient is taking warfarin and comes in today for a routine follow up visit.  Positive risk factors for bleeding include an age of 75 years or older.  The bleeding index is 'intermediate risk'.  Positive CHADS2 values include History of HTN.  Negative CHADS2 values include Age > 65 years old.  The start date was 01/23/2003.  Her last INR was 6.4 RATIO.  Anticoagulation responsible provider: Eden Emms MD, Theron Arista.  INR POC: 2.0.  Cuvette Lot#: [id number].  Exp: 10/2010.    Anticoagulation Management Assessment/Plan:      The patient's current anticoagulation dose is Warfarin sodium 5 mg tabs: Take as directed by coumadin clinic..  The target INR is 2 - 3.  The next INR is due 08/31/2009.  Anticoagulation instructions were given to patient.  Results were reviewed/authorized by Bethena Midget, RN, BSN.  She was notified by Bethena Midget, RN, BSN.         Prior Anticoagulation Instructions: INR 3.1  Decrease dose to 1 1/2 tablets everyday except 1 tablet on Monday and Thursday.   Current Anticoagulation Instructions: INR 2.0 Change dose to 7.5mg s daily except 5mg s on Mondays. Recheck in 2-3 weeks.

## 2010-04-12 NOTE — Medication Information (Signed)
Summary: rov/sp  Anticoagulant Therapy  Managed by: Cloyde Reams, RN, BSN Referring MD: Willa Rough MD PCP: DR Salome Holmes MD: Eden Emms MD, Theron Arista Indication 1: Atrial Fibrillation (ICD-427.31) Lab Used: [facility] Site: [facility] INR POC 3.3 INR RANGE 2 - 3  Dietary changes: yes       Details: Diet has varied, while pt has been on vacation.   Health status changes: no    Bleeding/hemorrhagic complications: no    Recent/future hospitalizations: no    Any changes in medication regimen? no    Recent/future dental: no  Any missed doses?: no       Is patient compliant with meds? yes       Allergies: No Known Drug Allergies  Anticoagulation Management History:      The patient is taking warfarin and comes in today for a routine follow up visit.  Positive risk factors for bleeding include an age of 75 years or older.  The bleeding index is 'intermediate risk'.  Positive CHADS2 values include History of HTN.  Negative CHADS2 values include Age > 58 years old.  The start date was 01/23/2003.  Her last INR was 6.4 RATIO.  Anticoagulation responsible provider: Eden Emms MD, Theron Arista.  INR POC: 3.3.  Cuvette Lot#: [id number].  Exp: 02/2011.    Anticoagulation Management Assessment/Plan:      The patient's current anticoagulation dose is Warfarin sodium 5 mg tabs: Take as directed by coumadin clinic..  The target INR is 2 - 3.  The next INR is due 01/25/2010.  Anticoagulation instructions were given to patient.  Results were reviewed/authorized by Cloyde Reams, RN, BSN.  She was notified by Cloyde Reams RN.         Prior Anticoagulation Instructions: INR 1.5  Take 2 tablets today then increase dose to 1 1/2 tablets every day except 1 tablet on Monday.  Recheck INR in 2 weeks.   Current Anticoagulation Instructions: INR 3.3  Take 1/2 tablet today, then start taking 1.5 tablets daily except 1 tablet on Mondays and Fridays.  Recheck in 3 weeks.

## 2010-04-12 NOTE — Medication Information (Signed)
Summary: rov/jk  Anticoagulant Therapy  Managed by: Weston Brass, PharmD Referring MD: Willa Rough MD PCP: DR Salome Holmes MD: Gala Romney MD,Daniel Indication 1: Atrial Fibrillation (ICD-427.31) Lab Used: [facility] Site: [facility] INR POC 3.7 INR RANGE 2 - 3  Dietary changes: yes       Details: added more greens  Health status changes: no    Bleeding/hemorrhagic complications: no    Recent/future hospitalizations: no    Any changes in medication regimen? no    Recent/future dental: no  Any missed doses?: no       Is patient compliant with meds? yes       Allergies: No Known Drug Allergies  Anticoagulation Management History:      Positive risk factors for bleeding include an age of 81 years or older.  The bleeding index is 'intermediate risk'.  Positive CHADS2 values include History of HTN.  Negative CHADS2 values include Age > 58 years old.  The start date was 01/23/2003.  Her last INR was 6.4 RATIO.  Anticoagulation responsible provider: Bensimhon MD,Daniel.  INR POC: 3.7.  Cuvette Lot#: [id number].  Exp: 12/2010.    Anticoagulation Management Assessment/Plan:      The patient's current anticoagulation dose is Warfarin sodium 5 mg tabs: Take as directed by coumadin clinic..  The target INR is 2 - 3.  The next INR is due 11/23/2009.  Anticoagulation instructions were given to patient.  Results were reviewed/authorized by Weston Brass, PharmD.  She was notified by Lyna Poser PharmD.         Prior Anticoagulation Instructions: INR 3.1  Today, August 19th, take 1 tablet (5mg ).  Then resume normal schedule of taking 1.5 tablets (7.5mg ) every day except take 1 tablet (5mg ) on Mondays. Recheck in 2 weeks.    Current Anticoagulation Instructions: INR 3.7 Hold dose today. Take 5 mg on wednesdays and mondays. Take 1.5  tablets (7.5 mg) all other days. Come see [place] next week.

## 2010-04-12 NOTE — Medication Information (Signed)
Summary: rov/sp  Anticoagulant Therapy  Managed by: Weston Brass, PharmD Referring MD: Willa Rough MD PCP: DR Salome Holmes MD: Myrtis Ser MD, Tinnie Gens Indication 1: Atrial Fibrillation (ICD-427.31) Lab Used: [facility] Site: [facility] INR POC 4.2 INR RANGE 2 - 3  Dietary changes: no    Health status changes: no    Bleeding/hemorrhagic complications: no    Recent/future hospitalizations: no    Any changes in medication regimen? no    Recent/future dental: no  Any missed doses?: no       Is patient compliant with meds? yes       Allergies: No Known Drug Allergies  Anticoagulation Management History:      The patient is taking warfarin and comes in today for a routine follow up visit.  Positive risk factors for bleeding include an age of 75 years or older.  The bleeding index is 'intermediate risk'.  Positive CHADS2 values include History of HTN.  Negative CHADS2 values include Age > 32 years old.  The start date was 01/23/2003.  Her last INR was 6.4 RATIO.  Anticoagulation responsible provider: Myrtis Ser MD, Tinnie Gens.  INR POC: 4.2.  Cuvette Lot#: [id number].  Exp: 12/2010.    Anticoagulation Management Assessment/Plan:      The patient's current anticoagulation dose is Warfarin sodium 5 mg tabs: Take as directed by coumadin clinic..  The target INR is 2 - 3.  The next INR is due 10/29/2009.  Anticoagulation instructions were given to patient.  Results were reviewed/authorized by Weston Brass, PharmD.  She was notified by Gweneth Fritter, PharmD Candidate.         Prior Anticoagulation Instructions: INR 2.6  Continue same dose of 1 1/2 tablets every day except 1 tablet on Monday.  Recheck in  4 weeks.   Current Anticoagulation Instructions: INR 4.2  Skip today's dose.  Tomorrow, take 1 tablet (5mg ).  Then resume normal schedule of taking 1.5 tablets (7.5mg ) daily except take 1 tablet (5mg ) on Monday.  Recheck in 2 weeks.

## 2010-04-12 NOTE — Medication Information (Signed)
Summary: rov/ewj  Anticoagulant Therapy  Managed by: Cloyde Reams, RN, BSN Referring MD: Willa Rough MD PCP: Carlota Raspberry MD Supervising MD: Tenny Craw MD, Gunnar Fusi Indication 1: Atrial Fibrillation (ICD-427.31) Lab Used: [facility] Site: [facility] INR POC 1.5 INR RANGE 2 - 3  Dietary changes: no    Health status changes: no    Bleeding/hemorrhagic complications: no    Recent/future hospitalizations: no    Any changes in medication regimen? no    Recent/future dental: no  Any missed doses?: no       Is patient compliant with meds? yes       Allergies (verified): No Known Drug Allergies  Anticoagulation Management History:      The patient is taking warfarin and comes in today for a routine follow up visit.  Positive risk factors for bleeding include an age of 75 years or older.  The bleeding index is 'intermediate risk'.  Positive CHADS2 values include History of HTN.  Negative CHADS2 values include Age > 75 years old.  The start date was 01/23/2003.  Her last INR was 6.4 RATIO.  Anticoagulation responsible provider: Tenny Craw MD, Gunnar Fusi.  INR POC: 1.5.  Cuvette Lot#: [id number].  Exp: 07/2010.    Anticoagulation Management Assessment/Plan:      The patient's current anticoagulation dose is Warfarin sodium 5 mg tabs: Take as directed by coumadin clinic..  The target INR is 2 - 3.  The next INR is due 07/05/2009.  Anticoagulation instructions were given to patient.  Results were reviewed/authorized by Cloyde Reams, RN, BSN.  She was notified by Cloyde Reams RN.         Prior Anticoagulation Instructions: INR 3.7  Skip today's dosage of coumadin then start taking 1.5 tablets daily except 1 tablet on Mondays, Thursdays and Saturdays.  Recheck in 3 weeks.    Current Anticoagulation Instructions: INR 1.5  Start taking 1.5 tablets daily except 1 tablet on Thursdays.  Recheck in 2 weeks.

## 2010-04-12 NOTE — Medication Information (Signed)
Summary: rov/ewj  Anticoagulant Therapy  Managed by: Weston Brass, PharmD Referring MD: Willa Rough MD PCP: DR Pete Glatter Supervising MD: Eden Emms MD, Theron Arista Indication 1: Atrial Fibrillation (ICD-427.31) Lab Used: [facility] Site: [facility] INR POC 2.1 INR RANGE 2 - 3  Dietary changes: no    Health status changes: no    Bleeding/hemorrhagic complications: no    Recent/future hospitalizations: no    Any changes in medication regimen? no    Recent/future dental: no  Any missed doses?: no       Is patient compliant with meds? yes       Allergies: No Known Drug Allergies  Anticoagulation Management History:      Positive risk factors for bleeding include an age of 75 years or older.  The bleeding index is 'intermediate risk'.  Positive CHADS2 values include History of HTN.  Negative CHADS2 values include Age > 47 years old.  The start date was 01/23/2003.  Her last INR was 6.4 RATIO.  Anticoagulation responsible provider: Eden Emms MD, Theron Arista.  INR POC: 2.1.  Exp: 02/2011.    Anticoagulation Management Assessment/Plan:      The patient's current anticoagulation dose is Warfarin sodium 5 mg tabs: Take as directed by coumadin clinic..  The target INR is 2 - 3.  The next INR is due 02/14/2010.  Anticoagulation instructions were given to patient.  Results were reviewed/authorized by Weston Brass, PharmD.  She was notified by Hoy Register, PharmD Candidate.         Prior Anticoagulation Instructions: INR 3.3  Take 1/2 tablet today, then start taking 1.5 tablets daily except 1 tablet on Mondays and Fridays.  Recheck in 3 weeks.    Current Anticoagulation Instructions: INR 2.1 Continue previous dose of 1.5 tablet everyday except 1 tablet on Monday and Friday Recheck INR in 3 weeks

## 2010-04-12 NOTE — Medication Information (Signed)
Summary: rov/mw  Anticoagulant Therapy  Managed by: Bethena Midget, RN, BSN Referring MD: Willa Rough MD PCP: DR Salome Holmes MD: Shirlee Latch MD, Janavia Rottman Indication 1: Atrial Fibrillation (ICD-427.31) Lab Used: [facility] Site: [facility] INR POC 2.6 INR RANGE 2 - 3  Dietary changes: no    Health status changes: no    Bleeding/hemorrhagic complications: no    Recent/future hospitalizations: no    Any changes in medication regimen? no    Recent/future dental: no  Any missed doses?: no       Is patient compliant with meds? yes      Comments: Pt doesn't want to check INR before she leaves time on 12/06/09, offeredd 12/03/09 but wants to wait until she returns.   Allergies: No Known Drug Allergies  Anticoagulation Management History:      The patient is taking warfarin and comes in today for a routine follow up visit.  Positive risk factors for bleeding include an age of 75 years or older.  The bleeding index is 'intermediate risk'.  Positive CHADS2 values include History of HTN.  Negative CHADS2 values include Age > 46 years old.  The start date was 01/23/2003.  Her last INR was 6.4 RATIO.  Anticoagulation responsible provider: Shirlee Latch MD, Desteni Piscopo.  INR POC: 2.6.  Cuvette Lot#: [id number].  Exp: 01/2011.    Anticoagulation Management Assessment/Plan:      The patient's current anticoagulation dose is Warfarin sodium 5 mg tabs: Take as directed by coumadin clinic..  The target INR is 2 - 3.  The next INR is due 12/15/2009.  Anticoagulation instructions were given to patient.  Results were reviewed/authorized by Bethena Midget, RN, BSN.  She was notified by Bethena Midget, RN, BSN.         Prior Anticoagulation Instructions: INR 3.7 Hold dose today. Take 5 mg on wednesdays and mondays. Take 1.5  tablets (7.5 mg) all other days. Come see [place] next week.  Current Anticoagulation Instructions: INR 2.6 Continue 7.5mg s daily except 5mg s on Mondays and Wednesdays.  Recheck in 2 weeks.

## 2010-04-12 NOTE — Assessment & Plan Note (Signed)
Summary: f1y   Visit Type:  Follow-up Primary Provider:  DR Pete Glatter  CC:  atrial fibrillation.  History of Present Illness: The patient has not had any chest pain.  She had an episode of atrial fibrillation in the past.  She is on Coumadin.  She has today if there was any possibility this could be stopped.  With careful discussion she tells me that she does have rare palpitations from time to time.  She may in fact have bursts of atrial fibrillation.  She also tells me that there is a very strong family history of strokes in many family members.  Current Medications (verified): 1)  Warfarin Sodium 5 Mg Tabs (Warfarin Sodium) .... Take As Directed By Coumadin Clinic. 2)  Metoprolol Tartrate 25 Mg Tabs (Metoprolol Tartrate) .... Take One Half  By Mouth Twice A Day 3)  Fish Oil   Oil (Fish Oil) .[name].. 1 Tab Twice Daily 4)  Ocuvite Preservision  Tabs (Multiple Vitamins-Minerals) .... Once Daily 5)  Vitamin D 2 125mg  .... 1 Tab Q Weekly 6)  Calcium Antacid 500 Mg Chew (Calcium Carbonate Antacid) .... Take 2 Tabs Daily 7)  Prilosec Otc 20 Mg Tbec (Omeprazole Magnesium) .[name].. 1 Tab Bid . 8)  Atelvia 35 Mg Tbec (Risedronate Sodium) .[name].. 1 Tab Q Weekly 9)  Estrace 1 Mg Tabs (Estradiol) .... As Needed  Allergies (verified): No Known Drug Allergies  Past History:  Past Medical History: Last updated: 08/25/2009 SPINAL STENOSIS (ICD-724.00) ANXIETY (ICD-300.00) COUMADIN THERAPY (ICD-V58.61) GERD (ICD-530.81) OSTEOPOROSIS (ICD-733.00) ATRIAL FIBRILLATION (ICD-427.31) HYPERTENSION, UNSPECIFIED (ICD-401.9) HYPERLIPIDEMIA-MIXED (ICD-272.4) CAD  cath.. 2008...Marland KitchenMarland Kitchen 40% first diagonal. /  nuclear...03/2008...normal EF 65%...ECHO..11/2006..flat closure MV, trivial MR    Review of Systems       Patient denies fever, chills, headache, sweats, rash, change in vision, change in hearing, chest pain, cough, nausea vomiting, urinary symptoms.  She does mention that she has significant GERD.All of the  systems are reviewed and are negative.  Vital Signs:  Patient profile:   77 year old female Height:      62 inches Weight:      104 pounds BMI:     19.09 Pulse rate:   49 / minute BP sitting:   153 / 68  (left arm) Cuff size:   regular  Vitals Entered By: Burnett Kanaris, CNA (August 26, 2009 9:41 AM)  Physical Exam  General:  patient is stable in general. Head:  head is atraumatic. Eyes:  no xanthelasma. Neck:  question of a soft right carotid bruit. Chest Wall:  no chest wall tenderness. Lungs:  lungs are clear.  Respiratory effort is nonlabored. Heart:  cardiac exam reveals S1 and S2.  No clicks or significant murmurs. Abdomen:  abdomen is soft. Msk:  no musculoskeletal deformities. Extremities:  no peripheral edema. Skin:  no skin rashes. Psych:  patient is oriented to person time and place.  Affect is normal.   Impression & Recommendations:  Problem # 1:  COUMADIN THERAPY (ICD-V58.61) After careful discussion the patient and I both agree that we should continue Coumadin.  If we were to consider stopping it I would at least want a 24-hour Holter.  However because she had some mild palpitations and because she continues on Coumadin we do not need a Holter at this time.  Problem # 2:  GERD (ICD-530.81) I explained to the patient that I did not think that her Coumadin or metoprolol was affecting her GERD.  Problem # 3:  ATRIAL FIBRILLATION (ICD-427.31) EKG is  done today and reviewed by me.  There is sinus rhythm.  No change in therapy.  Problem # 4:  HYPERTENSION, UNSPECIFIED (ICD-401.9) Systolic blood pressure is mildly elevated today.  I am not inclined to change her meds as she does seem somewhat anxious this morning.  Her blood pressure can be followed up with her primary doctor.  Problem # 5:  HYPERLIPIDEMIA-MIXED (ICD-272.4)  Fasting lipid profile will be done today.  She's not on medications. With mild coronary disease in the past, we should strongly consider putting  her on a statin.  I will await her labs first.  Orders: TLB-Lipid Panel (80061-LIPID)  Problem # 6:  CAD, UNSPECIFIED SITE (ICD-414.00)  Coronary disease is stable.  EKG done today and reviewed by me.  No significant change.  No further workup needed.  Orders: EKG w/ Interpretation (93000) TLB-Lipid Panel ([id number]-LIPID)  Problem # 7:  * CAROTID BRUIT There is a soft right carotid bruit today.  Patient has strong family history of strokes.  Carotid Doppler will be obtained.  Other Orders: Carotid Duplex (Carotid Duplex)  Patient Instructions: 1)  Lab today 2)  Your physician has requested that you have a carotid duplex. This test is an ultrasound of the carotid arteries in your neck. It looks at blood flow through these arteries that supply the brain with blood. Allow one hour for this exam. There are no restrictions or special instructions. 3)  Your physician wants you to follow-up in:  1 year.  You will receive a reminder letter in the mail two months in advance. If you don't receive a letter, please call our office to schedule the follow-up appointment.

## 2010-04-12 NOTE — Medication Information (Signed)
Summary: rov/ewj  Anticoagulant Therapy  Managed by: Shelby Dubin, PharmD, BCPS, CPP Referring MD: Willa Rough MD PCP: Carlota Raspberry MD Supervising MD: Jens Som MD, Arlys John Indication 1: Atrial Fibrillation (ICD-427.31) Lab Used: [facility] Site: [facility] INR POC 3.4 INR RANGE 2 - 3  Dietary changes: no    Health status changes: no    Bleeding/hemorrhagic complications: no    Recent/future hospitalizations: no    Any changes in medication regimen? no    Recent/future dental: no  Any missed doses?: no       Is patient compliant with meds? yes       Allergies (verified): No Known Drug Allergies  Anticoagulation Management History:      The patient is taking warfarin and comes in today for a routine follow up visit.  Positive risk factors for bleeding include an age of 75 years or older.  The bleeding index is 'intermediate risk'.  Positive CHADS2 values include History of HTN.  Negative CHADS2 values include Age > 75 years old.  The start date was 01/23/2003.  Her last INR was 6.4 RATIO.  Anticoagulation responsible provider: Jens Som MD, Arlys John.  INR POC: 3.4.  Cuvette Lot#: [id number].  Exp: 06/2010.    Anticoagulation Management Assessment/Plan:      The patient's current anticoagulation dose is Warfarin sodium 5 mg tabs: Take as directed by coumadin clinic..  The target INR is 2 - 3.  The next INR is due 05/31/2009.  Anticoagulation instructions were given to patient.  Results were reviewed/authorized by Shelby Dubin, PharmD, BCPS, CPP.  She was notified by Shelby Dubin PharmD, BCPS, CPP.         Prior Anticoagulation Instructions: INR 4.1  Skip today's dosage of coumadin, then resume same dosage 1.5 tablets daily except 1 tablet on Thursdays.  Recheck in 2 weeks.    Current Anticoagulation Instructions: INR 3.4  Skip 1 day then change to 1 tab each Monday and Thursday and 1.5 tabs on all other days.  Recheck in 4 weeks.

## 2010-04-12 NOTE — Medication Information (Signed)
Summary: rov/sp  Anticoagulant Therapy  Managed by: Weston Brass, PharmD Referring MD: Willa Rough MD PCP: Carlota Raspberry MD Supervising MD: Tenny Craw MD, Gunnar Fusi Indication 1: Atrial Fibrillation (ICD-427.31) Lab Used: [facility]  Site: [facility] INR POC 3.1 INR RANGE 2 - 3  Dietary changes: no    Health status changes: no    Bleeding/hemorrhagic complications: no    Recent/future hospitalizations: no    Any changes in medication regimen? no    Recent/future dental: no  Any missed doses?: no       Is patient compliant with meds? yes       Allergies: No Known Drug Allergies  Anticoagulation Management History:      The patient is taking warfarin and comes in today for a routine follow up visit.  Positive risk factors for bleeding include an age of 75 years or older.  The bleeding index is 'intermediate risk'.  Positive CHADS2 values include History of HTN.  Negative CHADS2 values include Age > 24 years old.  The start date was 01/23/2003.  Her last INR was 6.4 RATIO.  Anticoagulation responsible provider: Tenny Craw MD, Gunnar Fusi.  INR POC: 3.1.  Cuvette Lot#: [id number].  Exp: 10/2010.    Anticoagulation Management Assessment/Plan:      The patient's current anticoagulation dose is Warfarin sodium 5 mg tabs: Take as directed by coumadin clinic..  The target INR is 2 - 3.  The next INR is due 08/16/2009.  Anticoagulation instructions were given to patient.  Results were reviewed/authorized by Weston Brass, PharmD.  She was notified by Weston Brass PharmD.         Prior Anticoagulation Instructions: INR 3.0  Continue same dose of 1 1/2 tablets every day except 1 tablet on Thursday.  Try to eat 2-3 servings of green vegetables a week.   Current Anticoagulation Instructions: INR 3.1  Decrease dose to 1 1/2 tablets everyday except 1 tablet on Monday and Thursday.

## 2010-04-12 NOTE — Miscellaneous (Signed)
  Clinical Lists Changes  Observations: Added new observation of PAST MED HX: SPINAL STENOSIS (ICD-724.00) ANXIETY (ICD-300.00) COUMADIN THERAPY (ICD-V58.61) GERD (ICD-530.81) OSTEOPOROSIS (ICD-733.00) ATRIAL FIBRILLATION (ICD-427.31) HYPERTENSION, UNSPECIFIED (ICD-401.9) HYPERLIPIDEMIA-MIXED (ICD-272.4) CAD  cath.. 2008...Marland KitchenMarland Kitchen 40% first diagonal. /  nuclear...03/2008...normal EF 65%...ECHO..11/2006..flat closure MV, trivial MR   (08/25/2009 7:04) Added new observation of PRIMARY MD: Carlota Raspberry MD (08/25/2009 7:04)       Past History:  Past Medical History: SPINAL STENOSIS (ICD-724.00) ANXIETY (ICD-300.00) COUMADIN THERAPY (ICD-V58.61) GERD (ICD-530.81) OSTEOPOROSIS (ICD-733.00) ATRIAL FIBRILLATION (ICD-427.31) HYPERTENSION, UNSPECIFIED (ICD-401.9) HYPERLIPIDEMIA-MIXED (ICD-272.4) CAD  cath.. 2008...Marland KitchenMarland Kitchen 40% first diagonal. /  nuclear...03/2008...normal EF 65%...ECHO..11/2006..flat closure MV, trivial MR

## 2010-04-12 NOTE — Medication Information (Signed)
Summary: rov/tm  Anticoagulant Therapy  Managed by: Bethena Midget, RN, BSN Referring MD: Willa Rough MD PCP: Carlota Raspberry MD Supervising MD: Jens Som MD, Arlys John Indication 1: Atrial Fibrillation (ICD-427.31) Lab Used: [facility] Site: [facility] INR POC 2.5 INR RANGE 2 - 3  Dietary changes: no    Health status changes: no    Bleeding/hemorrhagic complications: no    Recent/future hospitalizations: no    Any changes in medication regimen? no    Recent/future dental: no  Any missed doses?: no       Is patient compliant with meds? yes       Allergies: No Known Drug Allergies  Anticoagulation Management History:      The patient is taking warfarin and comes in today for a routine follow up visit.  Positive risk factors for bleeding include an age of 75 years or older.  The bleeding index is 'intermediate risk'.  Positive CHADS2 values include History of HTN.  Negative CHADS2 values include Age > 87 years old.  The start date was 01/23/2003.  Her last INR was 6.4 RATIO.  Anticoagulation responsible provider: Jens Som MD, Arlys John.  INR POC: 2.5.  Cuvette Lot#: [id number].  Exp: 04/2010.    Anticoagulation Management Assessment/Plan:      The patient's current anticoagulation dose is Warfarin sodium 5 mg tabs: Take as directed by coumadin clinic..  The target INR is 2 - 3.  The next INR is due 04/19/2009.  Anticoagulation instructions were given to patient.  Results were reviewed/authorized by Bethena Midget, RN, BSN.  She was notified by Bethena Midget, RN, BSN.         Prior Anticoagulation Instructions: INR 2.0 Continue 7.5mg s daily except 5mg s on Thursdays. Recheck in 4 weeks.   Current Anticoagulation Instructions: INR 2.5 Continue 7.5mg s daily except 5mg s on Thursdays. Recheck in 4 weeks.

## 2010-04-12 NOTE — Medication Information (Signed)
Summary: rov/sp  Anticoagulant Therapy  Managed by: Weston Brass, PharmD Referring MD: Willa Rough MD PCP: DR Salome Holmes MD: Myrtis Ser MD, Tinnie Gens Indication 1: Atrial Fibrillation (ICD-427.31) Lab Used: [facility] Site: [facility] INR POC 2.6 INR RANGE 2 - 3  Dietary changes: no    Health status changes: no    Bleeding/hemorrhagic complications: no    Recent/future hospitalizations: no    Any changes in medication regimen? yes       Details: started zocor 20mg  about 3 days ago  Recent/future dental: no  Any missed doses?: no       Is patient compliant with meds? yes       Allergies: No Known Drug Allergies  Anticoagulation Management History:      The patient is taking warfarin and comes in today for a routine follow up visit.  Positive risk factors for bleeding include an age of 75 years or older.  The bleeding index is 'intermediate risk'.  Positive CHADS2 values include History of HTN.  Negative CHADS2 values include Age > 75 years old.  The start date was 01/23/2003.  Her last INR was 6.4 RATIO.  Anticoagulation responsible provider: Myrtis Ser MD, Tinnie Gens.  INR POC: 2.6.  Cuvette Lot#: [id number].  Exp: 11/2010.    Anticoagulation Management Assessment/Plan:      The patient's current anticoagulation dose is Warfarin sodium 5 mg tabs: Take as directed by coumadin clinic..  The target INR is 2 - 3.  The next INR is due 10/19/2009.  Anticoagulation instructions were given to patient.  Results were reviewed/authorized by Weston Brass, PharmD.  She was notified by Weston Brass PharmD.         Prior Anticoagulation Instructions: INR 3.1  Take 5mg  today then resume same dose of 7.5mg  every day except 5mg  on Monday.   Current Anticoagulation Instructions: INR 2.6  Continue same dose of 1 1/2 tablets every day except 1 tablet on Monday.  Recheck in  4 weeks.

## 2010-04-12 NOTE — Medication Information (Signed)
Summary: rov/tm  Anticoagulant Therapy  Managed by: Cloyde Reams, RN, BSN Referring MD: Willa Rough MD PCP: Carlota Raspberry MD Supervising MD: Jens Som MD, Arlys John Indication 1: Atrial Fibrillation (ICD-427.31) Lab Used: [facility] Site: [facility] INR POC 4.1 INR RANGE 2 - 3  Dietary changes: no    Health status changes: no    Bleeding/hemorrhagic complications: no    Recent/future hospitalizations: no    Any changes in medication regimen? no    Recent/future dental: no  Any missed doses?: no       Is patient compliant with meds? yes      Comments: Pt has been out of town, diet varied.    Allergies (verified): No Known Drug Allergies  Anticoagulation Management History:      The patient is taking warfarin and comes in today for a routine follow up visit.  Positive risk factors for bleeding include an age of 75 years or older.  The bleeding index is 'intermediate risk'.  Positive CHADS2 values include History of HTN.  Negative CHADS2 values include Age > 32 years old.  The start date was 01/23/2003.  Her last INR was 6.4 RATIO.  Anticoagulation responsible provider: Jens Som MD, Arlys John.  INR POC: 4.1.  Cuvette Lot#: [id number].  Exp: 06/2010.    Anticoagulation Management Assessment/Plan:      The patient's current anticoagulation dose is Warfarin sodium 5 mg tabs: Take as directed by coumadin clinic..  The target INR is 2 - 3.  The next INR is due 05/03/2009.  Anticoagulation instructions were given to patient.  Results were reviewed/authorized by Cloyde Reams, RN, BSN.  She was notified by Cloyde Reams RN.         Prior Anticoagulation Instructions: INR 2.5 Continue 7.5mg s daily except 5mg s on Thursdays. Recheck in 4 weeks.  Current Anticoagulation Instructions: INR 4.1  Skip today's dosage of coumadin, then resume same dosage 1.5 tablets daily except 1 tablet on Thursdays.  Recheck in 2 weeks.

## 2010-04-12 NOTE — Letter (Signed)
Summary: [facility] - Lipid  [facility], Main Office  [address]   [place], [place] [address]   Phone: [phone]  Fax: [phone]     November 25, 2009 MRN: [id number]   LAUREEN FREDERIC [address] [place], [place]  [address]   Dear Ms. MCNICHOLAS,  We have reviewed your cholesterol results.  They are as follows:     Total Cholesterol:    139 (Desirable: less than 200)       HDL  Cholesterol:     49.30  (Desirable: greater than 40 for men and 50 for women)       LDL Cholesterol:       80  (Desirable: less than 100 for low risk and less than 70 for moderate to high risk)       Triglycerides:       50.0  (Desirable: less than 150)  Our recommendations include:  Looks good, continue current medications   Call our office at the number listed above if you have any questions.  Lowering your LDL cholesterol is important, but it is only one of a large number of "risk factors" that may indicate that you are at risk for heart disease, stroke or other complications of hardening of the arteries.  Other risk factors include:   A.  Cigarette Smoking* B.  High Blood Pressure* C.  Obesity* D.   Low HDL Cholesterol (see yours above)* E.   Diabetes Mellitus (higher risk if your is uncontrolled) F.  Family history of premature heart disease G.  Previous history of stroke or cardiovascular disease    *These are risk factors YOU HAVE CONTROL OVER.  For more information, visit .  There is now evidence that lowering the TOTAL CHOLESTEROL AND LDL CHOLESTEROL can reduce the risk of heart disease.  The American Heart Association recommends the following guidelines for the treatment of elevated cholesterol:  1.  If there is now current heart disease and less than two risk factors, TOTAL CHOLESTEROL should be less than 200 and LDL CHOLESTEROL should be less than 100. 2.  If there is current heart disease or two or more risk factors, TOTAL CHOLESTEROL should be less than  200 and LDL CHOLESTEROL should be less than 70.  A diet low in cholesterol, saturated fat, and calories is the cornerstone of treatment for elevated cholesterol.  Cessation of smoking and exercise are also important in the management of elevated cholesterol and preventing vascular disease.  Studies have shown that 30 to 60 minutes of physical activity most days can help lower blood pressure, lower cholesterol, and keep your weight at a healthy level.  Drug therapy is used when cholesterol levels do not respond to therapeutic lifestyle changes (smoking cessation, diet, and exercise) and remains unacceptably high.  If medication is started, it is important to have you levels checked periodically to evaluate the need for further treatment options.  Thank you,    [employer] Team

## 2010-04-12 NOTE — Letter (Signed)
Summary: Handout Printed  Printed Handout:  - Coumadin Instructions-w/out Meds 

## 2010-04-12 NOTE — Medication Information (Signed)
Summary: rov/tm  Anticoagulant Therapy  Managed by: Weston Brass, PharmD Referring MD: Willa Rough MD PCP: DR Pete Glatter Supervising MD: Gala Romney MD, Reuel Boom Indication 1: Atrial Fibrillation (ICD-427.31) Lab Used: [facility] Site: [facility] INR POC 3.1 INR RANGE 2 - 3  Dietary changes: no    Health status changes: no    Bleeding/hemorrhagic complications: no    Recent/future hospitalizations: no    Any changes in medication regimen? no    Recent/future dental: no  Any missed doses?: no       Is patient compliant with meds? yes       Allergies: No Known Drug Allergies  Anticoagulation Management History:      The patient is taking warfarin and comes in today for a routine follow up visit.  Positive risk factors for bleeding include an age of 75 years or older.  The bleeding index is 'intermediate risk'.  Positive CHADS2 values include History of HTN.  Negative CHADS2 values include Age > 75 years old.  The start date was 01/23/2003.  Her last INR was 6.4 RATIO.  Anticoagulation responsible provider: Bensimhon MD, Reuel Boom.  INR POC: 3.1.  Cuvette Lot#: [id number].  Exp: 11/2010.    Anticoagulation Management Assessment/Plan:      The patient's current anticoagulation dose is Warfarin sodium 5 mg tabs: Take as directed by coumadin clinic..  The target INR is 2 - 3.  The next INR is due 09/21/2009.  Anticoagulation instructions were given to patient.  Results were reviewed/authorized by Weston Brass, PharmD.  She was notified by Weston Brass PharmD.         Prior Anticoagulation Instructions: INR 2.0 Change dose to 7.5mg s daily except 5mg s on Mondays. Recheck in 2-3 weeks.   Current Anticoagulation Instructions: INR 3.1  Take 5mg  today then resume same dose of 7.5mg  every day except 5mg  on Monday.

## 2010-04-12 NOTE — Medication Information (Signed)
Summary: [name]  Anticoagulant Therapy  Managed by: Cloyde Reams, RN, BSN Referring MD: Willa Rough MD PCP: Carlota Raspberry MD Supervising MD: Tenny Craw MD, Gunnar Fusi Indication 1: Atrial Fibrillation (ICD-427.31) Lab Used: [facility] Site: [facility] INR POC 3.7 INR RANGE 2 - 3  Dietary changes: no    Health status changes: no    Bleeding/hemorrhagic complications: no    Recent/future hospitalizations: no    Any changes in medication regimen? yes       Details: Resumed Actonel once weekly.  Recent/future dental: no  Any missed doses?: no       Is patient compliant with meds? yes       Current Medications (verified): 1)  Warfarin Sodium 5 Mg Tabs (Warfarin Sodium) .... Take As Directed By Coumadin Clinic. 2)  Metoprolol Tartrate 25 Mg Tabs (Metoprolol Tartrate) .... Take One Half  By Mouth Twice A Day 3)  Fish Oil   Oil (Fish Oil) .[name].. 1 Tab Twice Daily 4)  Ocuvite Preservision  Tabs (Multiple Vitamins-Minerals) .... Once Daily 5)  Vitamin D (Ergocalciferol) 50000 Unit Caps (Ergocalciferol) .... 2 Times Per Month 6)  Calcium Antacid 500 Mg Chew (Calcium Carbonate Antacid) .... Take 2 Tabs Daily 7)  Actonel 150 Mg Tabs (Risedronate Sodium) .[name].. 1 Per Month 8)  Prilosec Otc 20 Mg Tbec (Omeprazole Magnesium) .... Take One Tablet By Mouth Once Daily. 9)  Actonel 35 Mg Tabs (Risedronate Sodium) .... Once Weekly  Allergies (verified): No Known Drug Allergies  Anticoagulation Management History:      The patient is taking warfarin and comes in today for a routine follow up visit.  Positive risk factors for bleeding include an age of 75 years or older.  The bleeding index is 'intermediate risk'.  Positive CHADS2 values include History of HTN.  Negative CHADS2 values include Age > 63 years old.  The start date was 01/23/2003.  Her last INR was 6.4 RATIO.  Anticoagulation responsible provider: Tenny Craw MD, Gunnar Fusi.  INR POC: 3.7.  Cuvette Lot#: [id number].  Exp: 07/2010.    Anticoagulation  Management Assessment/Plan:      The patient's current anticoagulation dose is Warfarin sodium 5 mg tabs: Take as directed by coumadin clinic..  The target INR is 2 - 3.  The next INR is due 06/21/2009.  Anticoagulation instructions were given to patient.  Results were reviewed/authorized by Cloyde Reams, RN, BSN.  She was notified by Cloyde Reams RN.         Prior Anticoagulation Instructions: INR 3.4  Skip 1 day then change to 1 tab each Monday and Thursday and 1.5 tabs on all other days.  Recheck in 4 weeks.   Current Anticoagulation Instructions: INR 3.7  Skip today's dosage of coumadin then start taking 1.5 tablets daily except 1 tablet on Mondays, Thursdays and Saturdays.  Recheck in 3 weeks.

## 2010-04-14 NOTE — Medication Information (Signed)
Summary: rov/nb  Anticoagulant Therapy  Managed by: Weston Brass, PharmD Referring MD: Willa Rough MD PCP: DR Pete Glatter Supervising MD: Jens Som MD, Arlys John Indication 1: Atrial Fibrillation (ICD-427.31) Lab Used: [facility] Site: [facility] INR POC 2.3 INR RANGE 2 - 3  Dietary changes: no    Health status changes: no    Bleeding/hemorrhagic complications: no    Recent/future hospitalizations: no    Any changes in medication regimen? no    Recent/future dental: no  Any missed doses?: no       Is patient compliant with meds? yes       Allergies: No Known Drug Allergies  Anticoagulation Management History:      The patient is taking warfarin and comes in today for a routine follow up visit.  Positive risk factors for bleeding include an age of 75 years or older.  The bleeding index is 'intermediate risk'.  Positive CHADS2 values include History of HTN.  Negative CHADS2 values include Age > 55 years old.  The start date was 01/23/2003.  Her last INR was 6.4 RATIO.  Anticoagulation responsible Latavious Bitter: Jens Som MD, Arlys John.  INR POC: 2.3.  Cuvette Lot#: [id number].  Exp: 04/2011.    Anticoagulation Management Assessment/Plan:      The patient's current anticoagulation dose is Warfarin sodium 5 mg tabs: Take as directed by coumadin clinic..  The target INR is 2 - 3.  The next INR is due 03/21/2010.  Anticoagulation instructions were given to patient.  Results were reviewed/authorized by Weston Brass, PharmD.  She was notified by Weston Brass PharmD.         Prior Anticoagulation Instructions: INR 2.1 Continue previous dose of 1.5 tablet everyday except 1 tablet on Monday and Friday Recheck INR in 3 weeks  Current Anticoagulation Instructions: INR 2.3  Continue same dose of 1 1/2 tablets every day except 1 tablet on Monday and Friday.  Recheck INR in 4 weeks.

## 2010-04-14 NOTE — Medication Information (Signed)
Summary: rov/sp  Anticoagulant Therapy  Managed by: Weston Brass, PharmD Referring MD: Willa Rough MD PCP: DR Pete Glatter Supervising MD: Jens Som MD, Arlys John Indication 1: Atrial Fibrillation (ICD-427.31) Lab Used: [facility] Site: [facility] INR POC 2.7 INR RANGE 2 - 3  Dietary changes: no    Health status changes: no    Bleeding/hemorrhagic complications: no    Recent/future hospitalizations: no    Any changes in medication regimen? no    Recent/future dental: no  Any missed doses?: no       Is patient compliant with meds? yes       Allergies: No Known Drug Allergies  Anticoagulation Management History:      The patient is taking warfarin and comes in today for a routine follow up visit.  Positive risk factors for bleeding include an age of 75 years or older.  The bleeding index is 'intermediate risk'.  Positive CHADS2 values include History of HTN.  Negative CHADS2 values include Age > 48 years old.  The start date was 01/23/2003.  Her last INR was 6.4 RATIO.  Anticoagulation responsible provider: Jens Som MD, Arlys John.  INR POC: 2.7.  Cuvette Lot#: [id number].  Exp: 04/2011.    Anticoagulation Management Assessment/Plan:      The patient's current anticoagulation dose is Warfarin sodium 5 mg tabs: Take as directed by coumadin clinic..  The target INR is 2 - 3.  The next INR is due 04/18/2010.  Anticoagulation instructions were given to patient.  Results were reviewed/authorized by Weston Brass, PharmD.  She was notified by Weston Brass PharmD.         Prior Anticoagulation Instructions: INR 2.3  Continue same dose of 1 1/2 tablets every day except 1 tablet on Monday and Friday.  Recheck INR in 4 weeks.   Current Anticoagulation Instructions: INR 2.7  Continue same dose of 1 1/2 tablets every day except 1 tablet on Monday and Friday.  Recheck INR in 4 weeks.

## 2010-04-18 ENCOUNTER — Encounter: Payer: Self-pay | Admitting: Cardiology

## 2010-04-18 ENCOUNTER — Encounter (INDEPENDENT_AMBULATORY_CARE_PROVIDER_SITE_OTHER): Payer: Medicare Other

## 2010-04-18 DIAGNOSIS — Z7901 Long term (current) use of anticoagulants: Secondary | ICD-10-CM

## 2010-04-18 DIAGNOSIS — I4891 Unspecified atrial fibrillation: Secondary | ICD-10-CM

## 2010-04-28 NOTE — Medication Information (Signed)
Summary: Coumadin Clinic  Anticoagulant Therapy  Managed by: Bethena Midget, RN, BSN Referring MD: Willa Rough MD PCP: DR Salome Holmes MD: Myrtis Ser MD, Tinnie Gens Indication 1: Atrial Fibrillation (ICD-427.31) Lab Used: [facility] Site: [facility] INR POC 3.4 INR RANGE 2 - 3  Dietary changes: yes       Details: Has had less green leafy  Health status changes: no    Bleeding/hemorrhagic complications: no    Recent/future hospitalizations: no    Any changes in medication regimen? yes       Details: Taking DGL Licorice Root Extract for past 4 days.   Recent/future dental: no  Any missed doses?: no       Is patient compliant with meds? yes       Allergies: No Known Drug Allergies  Anticoagulation Management History:      The patient is taking warfarin and comes in today for a routine follow up visit.  Positive risk factors for bleeding include an age of 75 years or older.  The bleeding index is 'intermediate risk'.  Positive CHADS2 values include History of HTN.  Negative CHADS2 values include Age > 56 years old.  The start date was 01/23/2003.  Her last INR was 6.4 RATIO.  Anticoagulation responsible provider: Myrtis Ser MD, Tinnie Gens.  INR POC: 3.4.  Cuvette Lot#: [id number].  Exp: 03/2011.    Anticoagulation Management Assessment/Plan:      The patient's current anticoagulation dose is Warfarin sodium 5 mg tabs: Take as directed by coumadin clinic..  The target INR is 2 - 3.  The next INR is due 05/02/2010.  Anticoagulation instructions were given to patient.  Results were reviewed/authorized by Bethena Midget, RN, BSN.  She was notified by Bethena Midget, RN, BSN.         Prior Anticoagulation Instructions: INR 2.7  Continue same dose of 1 1/2 tablets every day except 1 tablet on Monday and Friday.  Recheck INR in 4 weeks.   Current Anticoagulation Instructions: INR 3.4 Skip today's dose then resume 7.5mg s everyday except 5mg s on Mondays and Fridays. Recheck in 2 weeks.

## 2010-05-02 ENCOUNTER — Encounter: Payer: Self-pay | Admitting: Internal Medicine

## 2010-05-02 ENCOUNTER — Encounter (INDEPENDENT_AMBULATORY_CARE_PROVIDER_SITE_OTHER): Payer: Medicare Other

## 2010-05-02 DIAGNOSIS — Z7901 Long term (current) use of anticoagulants: Secondary | ICD-10-CM

## 2010-05-02 DIAGNOSIS — I4891 Unspecified atrial fibrillation: Secondary | ICD-10-CM

## 2010-05-10 NOTE — Medication Information (Signed)
Summary: rov/tm  Anticoagulant Therapy  Managed by: Weston Brass, PharmD Referring MD: Willa Rough MD PCP: DR Pete Glatter Supervising MD: Tenny Craw MD, Gunnar Fusi Indication 1: Atrial Fibrillation (ICD-427.31) Lab Used: [facility] Site: [facility] INR POC 1.6 INR RANGE 2 - 3  Dietary changes: no    Health status changes: no    Bleeding/hemorrhagic complications: no    Recent/future hospitalizations: no    Any changes in medication regimen? yes       Details: Stopped prilosec and started several supplements including ginger and DGL, a probiotic, and B-12 vitamin  Recent/future dental: no  Any missed doses?: no       Is patient compliant with meds? yes       Allergies: No Known Drug Allergies  Anticoagulation Management History:      The patient is taking warfarin and comes in today for a routine follow up visit.  Positive risk factors for bleeding include an age of 31 years or older.  The bleeding index is 'intermediate risk'.  Positive CHADS2 values include History of HTN.  Negative CHADS2 values include Age > 30 years old.  The start date was 01/23/2003.  Her last INR was 6.4 RATIO.  Anticoagulation responsible provider: Tenny Craw MD, Gunnar Fusi.  INR POC: 1.6.  Cuvette Lot#: [id number].  Exp: 03/2011.    Anticoagulation Management Assessment/Plan:      The patient's current anticoagulation dose is Warfarin sodium 5 mg tabs: Take as directed by coumadin clinic..  The target INR is 2 - 3.  The next INR is due 05/16/2010.  Anticoagulation instructions were given to patient.  Results were reviewed/authorized by Weston Brass, PharmD.  She was notified by Margot Chimes PharmD Candidate.         Prior Anticoagulation Instructions: INR 3.4 Skip today's dose then resume 7.5mg s everyday except 5mg s on Mondays and Fridays. Recheck in 2 weeks.   Current Anticoagulation Instructions: INR 1.6  Take 2 tablets today and then resume your normal dose of 1 and 1/2 tablets everyday except on Mondays and  Fridays when you only take 1 tablet.  Recheck INR in 2 weeks.

## 2010-05-13 ENCOUNTER — Encounter: Payer: Self-pay | Admitting: Cardiology

## 2010-05-13 DIAGNOSIS — I4891 Unspecified atrial fibrillation: Secondary | ICD-10-CM

## 2010-05-16 ENCOUNTER — Encounter: Payer: Self-pay | Admitting: Internal Medicine

## 2010-05-16 ENCOUNTER — Encounter (INDEPENDENT_AMBULATORY_CARE_PROVIDER_SITE_OTHER): Payer: Medicare Other

## 2010-05-16 DIAGNOSIS — Z7901 Long term (current) use of anticoagulants: Secondary | ICD-10-CM

## 2010-05-16 DIAGNOSIS — I4891 Unspecified atrial fibrillation: Secondary | ICD-10-CM

## 2010-05-16 LAB — CONVERTED CEMR LAB: POC INR: 2

## 2010-05-19 ENCOUNTER — Encounter: Payer: Self-pay | Admitting: Cardiology

## 2010-05-24 NOTE — Letter (Signed)
Summary: Lipid reminder  [facility], Main Office  [address]   [place], [place] [address]   Phone: [phone]  Fax: [phone]        May 19, 2010 MRN: [id number]    [name] [address] [place], [place]  [address]    Dear [name],  Our records indicate it is time to check your cholesterol.  Please call our office to schedule an appt for labwork.  Please remember it is a fasting lab.     Sincerely,  Meredith Staggers, RN Willa Rough, MD  This letter has been electronically signed by your physician.

## 2010-05-24 NOTE — Medication Information (Signed)
Summary: rov/cb  Anticoagulant Therapy  Managed by: Weston Brass, PharmD Referring MD: Willa Rough MD PCP: DR Pete Glatter Supervising MD: Tenny Craw MD, Gunnar Fusi Indication 1: Atrial Fibrillation (ICD-427.31) Lab Used: [facility] Site: [facility] INR POC 2.0 INR RANGE 2 - 3  Dietary changes: no    Health status changes: no    Bleeding/hemorrhagic complications: no    Recent/future hospitalizations: no    Any changes in medication regimen? yes       Details: on supplement  Recent/future dental: no  Any missed doses?: no       Is patient compliant with meds? yes       Allergies: No Known Drug Allergies  Anticoagulation Management History:      The patient is taking warfarin and comes in today for a routine follow up visit.  Positive risk factors for bleeding include an age of 75 years or older.  The bleeding index is 'intermediate risk'.  Positive CHADS2 values include History of HTN.  Negative CHADS2 values include Age > 17 years old.  The start date was 01/23/2003.  Her last INR was 6.4 RATIO.  Anticoagulation responsible provider: Tenny Craw MD, Gunnar Fusi.  INR POC: 2.0.  Cuvette Lot#: [id number].  Exp: 03/2011.    Anticoagulation Management Assessment/Plan:      The patient's current anticoagulation dose is Warfarin sodium 5 mg tabs: Take as directed by coumadin clinic..  The target INR is 2 - 3.  The next INR is due 06/14/2010.  Anticoagulation instructions were given to patient.  Results were reviewed/authorized by Weston Brass, PharmD.  She was notified by Weston Brass PharmD.         Prior Anticoagulation Instructions: INR 1.6  Take 2 tablets today and then resume your normal dose of 1 and 1/2 tablets everyday except on Mondays and Fridays when you only take 1 tablet.  Recheck INR in 2 weeks.    Current Anticoagulation Instructions: INR 2.0  Continue same dose of 1 1/2 tablets every day except 1 tablet on Monday and Friday.  Recheck INR in 4 weeks.

## 2010-06-07 ENCOUNTER — Other Ambulatory Visit (HOSPITAL_COMMUNITY)
Admission: RE | Admit: 2010-06-07 | Discharge: 2010-06-07 | Disposition: A | Discharge status: Home / Self Care | Payer: Medicare Other | Source: Ambulatory Visit | Attending: Obstetrics and Gynecology | Admitting: Obstetrics and Gynecology

## 2010-06-07 ENCOUNTER — Encounter (INDEPENDENT_AMBULATORY_CARE_PROVIDER_SITE_OTHER): Payer: Medicare Other | Admitting: Obstetrics and Gynecology

## 2010-06-07 ENCOUNTER — Other Ambulatory Visit: Payer: Self-pay | Admitting: Obstetrics and Gynecology

## 2010-06-07 DIAGNOSIS — Z124 Encounter for screening for malignant neoplasm of cervix: Secondary | ICD-10-CM | POA: Insufficient documentation

## 2010-06-07 DIAGNOSIS — E559 Vitamin D deficiency, unspecified: Secondary | ICD-10-CM

## 2010-06-07 DIAGNOSIS — R823 Hemoglobinuria: Secondary | ICD-10-CM

## 2010-06-07 DIAGNOSIS — M899 Disorder of bone, unspecified: Secondary | ICD-10-CM

## 2010-06-07 DIAGNOSIS — N952 Postmenopausal atrophic vaginitis: Secondary | ICD-10-CM

## 2010-06-07 DIAGNOSIS — N951 Menopausal and female climacteric states: Secondary | ICD-10-CM

## 2010-06-14 ENCOUNTER — Other Ambulatory Visit (INDEPENDENT_AMBULATORY_CARE_PROVIDER_SITE_OTHER): Payer: Medicare Other | Admitting: *Deleted

## 2010-06-14 ENCOUNTER — Ambulatory Visit (INDEPENDENT_AMBULATORY_CARE_PROVIDER_SITE_OTHER): Payer: Medicare Other | Admitting: *Deleted

## 2010-06-14 DIAGNOSIS — E7800 Pure hypercholesterolemia, unspecified: Secondary | ICD-10-CM

## 2010-06-14 DIAGNOSIS — I4891 Unspecified atrial fibrillation: Secondary | ICD-10-CM

## 2010-06-14 DIAGNOSIS — Z7901 Long term (current) use of anticoagulants: Secondary | ICD-10-CM

## 2010-06-14 NOTE — Patient Instructions (Signed)
Continue on same dosage 1.5 tablets daily except 1 tablet on Mondays and Fridays.  Recheck in 4 weeks.   

## 2010-06-15 LAB — LIPID PANEL
HDL: 60.3 mg/dL (ref >39.00)
LDL Cholesterol: 74 mg/dL (ref 0–99)
Total CHOL/HDL Ratio: 2
VLDL: 10.4 mg/dL (ref 0.0–40.0)

## 2010-06-16 ENCOUNTER — Encounter: Payer: Self-pay | Admitting: *Deleted

## 2010-06-28 LAB — DIFFERENTIAL
Basophils Relative: 1 % (ref 0–1)
Lymphs Abs: 1.5 10*3/uL (ref 0.7–4.0)
Monocytes Relative: 9 % (ref 3–12)
Neutro Abs: 4.9 10*3/uL (ref 1.7–7.7)
Neutrophils Relative %: 69 % (ref 43–77)

## 2010-06-28 LAB — CBC
HCT: 38.2 % (ref 36.0–46.0)
HCT: 42.5 % (ref 36.0–46.0)
Hemoglobin: 13 g/dL (ref 12.0–15.0)
Hemoglobin: 14.8 g/dL (ref 12.0–15.0)
MCHC: 34.1 g/dL (ref 30.0–36.0)
MCHC: 34.9 g/dL (ref 30.0–36.0)
MCV: 94.5 fL (ref 78.0–100.0)
MCV: 96.2 fL (ref 78.0–100.0)
RBC: 4.49 MIL/uL (ref 3.87–5.11)
RDW: 13 % (ref 11.5–15.5)
WBC: 5.4 10*3/uL (ref 4.0–10.5)

## 2010-06-28 LAB — BASIC METABOLIC PANEL
BUN: 14 mg/dL (ref 6–23)
CO2: 25 mEq/L (ref 19–32)
Calcium: 9.4 mg/dL (ref 8.4–10.5)
Chloride: 109 mEq/L (ref 96–112)
Creatinine, Ser: 0.54 mg/dL (ref 0.4–1.2)
GFR calc Af Amer: >60 mL/min (ref >60)

## 2010-06-28 LAB — CARDIAC PANEL(CRET KIN+CKTOT+MB+TROPI)
Relative Index: INVALID (ref 0.0–2.5)
Relative Index: INVALID (ref 0.0–2.5)
Troponin I: <0.01 ng/mL (ref 0.00–0.06)

## 2010-06-28 LAB — POCT I-STAT, CHEM 8
Calcium, Ion: 1.11 mmol/L — ABNORMAL LOW (ref 1.12–1.32)
Glucose, Bld: 135 mg/dL — ABNORMAL HIGH (ref 70–99)
HCT: 41 % (ref 36.0–46.0)
Hemoglobin: 13.9 g/dL (ref 12.0–15.0)
Potassium: 4.3 mEq/L (ref 3.5–5.1)
TCO2: 26 mmol/L (ref 0–100)

## 2010-06-28 LAB — COMPREHENSIVE METABOLIC PANEL
BUN: 16 mg/dL (ref 6–23)
CO2: 27 mEq/L (ref 19–32)
Calcium: 9 mg/dL (ref 8.4–10.5)
GFR calc non Af Amer: >60 mL/min (ref >60)
Glucose, Bld: 129 mg/dL — ABNORMAL HIGH (ref 70–99)
Total Protein: 6.2 g/dL (ref 6.0–8.3)

## 2010-06-28 LAB — TSH: TSH: 2.883 u[IU]/mL (ref 0.350–4.500)

## 2010-06-28 LAB — PROTIME-INR
Prothrombin Time: 31.3 seconds — ABNORMAL HIGH (ref 11.6–15.2)
Prothrombin Time: 35.1 seconds — ABNORMAL HIGH (ref 11.6–15.2)

## 2010-07-10 ENCOUNTER — Other Ambulatory Visit: Payer: Self-pay | Admitting: Cardiology

## 2010-07-12 ENCOUNTER — Ambulatory Visit (INDEPENDENT_AMBULATORY_CARE_PROVIDER_SITE_OTHER): Payer: Medicare Other | Admitting: *Deleted

## 2010-07-12 DIAGNOSIS — I4891 Unspecified atrial fibrillation: Secondary | ICD-10-CM

## 2010-07-26 NOTE — Assessment & Plan Note (Signed)
[facility]                            CARDIOLOGY OFFICE NOTE   NAME:Langbehn, TAFFY DELCONTE                     MRN:          [id number]  DATE:08/22/2006                            DOB:          [birth year]    PRIMARY CARE PHYSICIAN:  Brett Canales A. Cleta Alberts, M.D.   HISTORY OF PRESENT ILLNESS:  Ms. Theissen is a 77 year old lady with  paroxysmal atrial fibrillation which we manage with rate control and  anticoagulation. She tends to have approximately 1-minute episodes of  palpitations when she is tired or stressed. She always feels very  slightly light-headed with these. She has never lost consciousness or  fallen.   Over the past several months, she has noted some palpitations that are  distinct from these. These are quite worrisome to her. They do sound to  be isolated premature ventricular contractions based on history. She  tells me she is afraid there is something much worse.   CURRENT MEDICATIONS:  1. Coumadin followed by our Coumadin clinic.  2. Prilosec 20 mg daily.  3. Diltiazem CD 240 mg daily.  4. Calcium +D.  5. Fish oil.  6. Multivitamin.   PHYSICAL EXAMINATION:  GENERAL:  She is generally well-appearing in no  distress.  VITAL SIGNS:  Heart rate 70, blood pressure 146/64 and weight of 109  pounds. Blood pressure general runs in the 110 to 130 range at home.  NECK:  She has no jugular venous distention, thyromegaly, or  lymphadenopathy.  LUNGS:  Respiratory effort is normal. Clear to auscultation.  CARDIAC:  She has a nondisplaced point of maximal cardiac impulse. There  is a regular rate and rhythm without murmur, rub or gallop.  ABDOMEN:  The abdomen is soft, nondistended, nontender. There is  hepatosplenomegaly. Bowel sounds are normal.  EXTREMITIES:  Warm and without edema.   Electrocardiogram demonstrates normal sinus rhythm with nonspecific ST-T  abnormalities.   IMPRESSION/RECOMMENDATIONS:  1. Paroxysmal atrial fibrillation.  Continue rate control and      anticoagulation with current medications.  2. Hypertension. Nicely controlled based on home blood pressure      measurements.  3. Palpitations. Will check 48-hour Holter to provide her some      reassurance.     Salvadore Farber, MD  Electronically Signed    WED/MedQ  DD: 08/22/2006  DT: 08/22/2006  Job #: [id number]

## 2010-07-26 NOTE — Discharge Summary (Signed)
[name], [name]              ACCOUNT NO.:  [id number]   MEDICAL RECORD NO.:  [id number]          PATIENT TYPE:  OBV   LOCATION:  2036                         FACILITY:  [facility]   PHYSICIAN:  Jesse Sans. Wall, MD, FACCDATE OF BIRTH:  02/11/1934   DATE OF ADMISSION:  04/18/2008  DATE OF DISCHARGE:  04/19/2008                               DISCHARGE SUMMARY   PRIMARY CARE CARDIOLOGIST:  Luis Abed, MD, [facility]   PRINCIPAL DIAGNOSES:  1. Atypical chest pain.      a.     Negative serial cardiac markers.      b.     History of nonobstructive coronary artery disease, September       2008.   SECONDARY DIAGNOSES:  1. Paroxysmal atrial fibrillation.      a.     On chronic Coumadin.  2. Hypertension.  3. Normal left ventricular function.  4. Gastroesophageal reflux disease.   REASON FOR ADMISSION:  [name] is a 77 year old female, with  history of nonobstructive CAD with previous catheterization in September  2008, who presented to the emergency room with complaints chest pain and  palpitations.   HOSPITAL COURSE:  The patient was admitted for overnight observation,  and her symptoms were felt to be quite atypical.  Serial cardiac markers  were all within normal limits.  Her symptoms appeared to be most  suggestive of refractory reflux disease, for she is followed by Dr.  Danise Edge.  Of note, she is on twice daily proton pump inhibitor.   With respect to her history of PAF, there was no evidence of dysrhythmia  during this brief stay.  She is on chronic Coumadin, which was  therapeutic.   Medications were adjusted with initial addition of HCTZ 25 mg daily,  given presentation to the ER with a blood pressure of 196/80.  However,  her subsequent blood pressures were approximately 110 systolic.  She  also reports no significant history of hypertension.  A decision was  made to discontinue hydrochlorothiazide, and to continue monitoring this  closely as an outpatient.   Additionally, Dr. Daleen Squibb discontinued diltiazem, because of concern that  this could be exacerbating a probable esophageal spasm.  In its place,  she was started on low-dose metoprolol 12.5 mg b.i.d., felt to be more  helpful in this particular setting.   No further cardiac workup was recommended.  The patient was instructed  to follow up with Dr. Myrtis Ser in the next few weeks, as previously  scheduled.   DISCHARGE DISPOSITION:  Stable.   NOTABLE LABORATORY DATA:  Normal CBC, electrolytes, renal function, and  serial cardiac markers.  INR 3.2 on admission.  TSH 2.9.   DISCHARGE MEDICATIONS:  1. Lopressor 12.5 mg b.i.d. (new).  2. Coumadin, as previously directed.  3. Prilosec 40 mg b.i.d.  4. Fish oil, as previously directed.   INSTRUCTIONS:  1. The patient is to stop taking diltiazem.  2. Follow up with Dr. Willa Rough in the next few weeks, as      previously scheduled.   DISCHARGE ENCOUNTER:  Greater than 30 minutes.  Gene Serpe, PA-C      Thomas C. Daleen Squibb, MD, [facility]  Electronically Signed    GS/MEDQ  D:  04/19/2008  T:  04/20/2008  Job:  [id number]   cc:   Chales Salmon. Abigail Miyamoto, M.D.  Danise Edge, M.D.

## 2010-07-26 NOTE — Assessment & Plan Note (Signed)
[facility]                            CARDIOLOGY OFFICE NOTE   NAME:[name], [name]                     MRN:          [id number]  DATE:12/19/2006                            DOB:          Feb 24, 1934    This is a patient of Willa Rough, MD, [facility].   PRIMARY CARE:  Chales Salmon. Abigail Miyamoto, M.D.   This is a posthospital visit.   This is a very pleasant 77 year old white female patient who was  admitted to the hospital with chest pain into her jaw and underwent  cardiac catheterization.  This revealed nonobstructive coronary artery  disease with 40% first diagonal, otherwise no significant disease,  normal LV function.  Medical therapy recommended.  She did develop  bradycardia and hypotension with Imdur and Lopressor in the hospital,  and both were discontinued.  She has had a history of paroxysmal atrial  fibrillation and remains on Coumadin therapy.  She is due to have a  myelogram next week and was asked to be off Coumadin, which she has  arranged with Coumadin clinic.  She also asked about her LDL in the  hospital as 121, but her cholesterol was 189, triglycerides 48, HDL 57,  LDL 121.  At this time, she was referred to try diet alone and not start  another medication.  She is not having anymore chest pain.  She states  she thinks this all came from the Actonel she was on, and she has  stopped now.   CURRENT MEDICATIONS:  1. Warfarin as directed.  2. Prilosec 20 mg daily.  3. Diltiazem CD 240 mg daily.  4. Calcium plus D daily.  5. Fish oil daily.  6. PreserVision vitamin daily.  7. Extra vitamin D 2000 mg daily.   PHYSICAL EXAM:  This is a pleasant 77 year old white female in no acute  distress.  Blood pressure 130/54, pulse 70, weight 106.  NECK:  Without JVH, JR, bruit, or thyroid enlargement.  LUNGS:  Clear posterolateral.  HEART:  Regular rate and rhythm at 70 beats per minute.  Normal S1 and  S2.  No significant murmur, rub, bruit,  thrill, or heave noted.  ABDOMEN:  Soft without organomegaly, masses, lesions, or abnormal  tenderness.  Right __________ without hematoma or hemorrhage.  EXTREMITIES:  Without cyanosis, clubbing, or edema, and she has good  distal pulses.   IMPRESSION:  1. Nonobstructive coronary artery disease with normal left ventricular      function on cardiac catheterization.  908.  2. Paroxysmal atrial fibrillation.  3. Coumadin therapy.  4. Hypertension.  5. LDL of 122, other lipids normal.  We will try diet at this time.  6. Osteoporosis.  7. Guillain-Barre disease.  8. Anxiety.  9. Gastroesophageal reflux disease.   PLAN:  The patient would really like to try diet prior to starting any  lipid-lowering agents, which I think is reasonable given her  nonobstructive coronary artery disease and minimal risk factors.  She is  followed closely by the Coumadin clinic, and she will see Dr. Myrtis Ser back  in 6 months' time.  Jacolyn Reedy, PA-C  Electronically Signed      Doylene Canning. Ladona Ridgel, MD  Electronically Signed   ML/MedQ  DD: 12/19/2006  DT: 12/19/2006  Job #: [id number]   cc:   Chales Salmon. Abigail Miyamoto, M.D.

## 2010-07-26 NOTE — Assessment & Plan Note (Signed)
[facility]                            CARDIOLOGY OFFICE NOTE   NAME:[name], [name]                     MRN:          [id number]  DATE:                                      DOB:          1933/06/03    Ms. Tallent is seen today with the palpitations and chest discomfort.  I have followed her over time.  Catheterization in 2008 revealed a 40%  diagonal lesion.  I would like to ultimately have her on a statin.  Her  LDL has been of 124.  Her HDL is good and her triglycerides are good.   The patient has marked reflux.  Recently, she has had ongoing discomfort  that she feels may be from her reflux, but she is concerned that it  might be cardiac in origin.  Also, this morning, she had a brief burst  of palpitations.  She had a history of atrial fibrillation in the past.  This resolved.  She was quite concerned about all these findings and we  added her on the schedule to be seen.   ALLERGIES:  No known drug allergies.[name]   MEDICATIONS:  1. Coumadin.  2. Prilosec.  3. Diltiazem 240.  4. Calcium.  5. Fish oil.  6. Vitamins.   OTHER MEDICAL PROBLEMS:  See the list below.   REVIEW OF SYSTEMS:  The patient is not having any fevers or chills.  She  has no headaches.  She is not having any rashes.  Her review of systems  is negative.   PHYSICAL EXAMINATION:  VITAL SIGNS:  Her blood pressure is elevated  today 179/84 with a pulse of 92, and she is anxious as she came into the  office today.  GENERAL:  The patient is oriented to person, time, and place.  Affect is  normal.  HEENT:  No xanthelasma.  She has normal extraocular motion.  NECK:  There are no carotid bruits.  There is no jugular venous  distention.  LUNGS:  Clear.  Respiratory effort is not labored.  CARDIAC:  S1 and S2.  There are no clicks or significant murmurs.  ABDOMEN:  Soft.  She has no peripheral edema.   EKG reveals sinus rhythm.  She does have diffuse nonspecific ST-T wave  changes.  These have not changed over time.   PROBLEMS:  1. History of very mild nonobstructive coronary artery disease by      catheterization in September 2008 with a 40% first diagonal.  She      is having chest pain now.  I am not convinced that it is cardiac,      but we do need to be sure.  She will have an adenosine Myoview      scan.  She does have an abnormal EKG.  2. LDL of 124.  With mild coronary artery disease, she needs to be a      statin.  I have recommended this in the past and we will discuss      this with her again in her next visit.  3.  Paroxysmal atrial fibrillation.  She probably had a burst of atrial      fibrillation today.  I have chosen not to change her medicines.  4. Coumadin therapy for her atrial fibrillation.  5. Hypertension.  She needs followup blood pressure checks.  6. Osteoporosis.  7. History of some mild anxiety over time.  8. Gastroesophageal reflux disease.  Her gastroesophageal reflux      disease appears to be quite significant.  This may be her major      symptom.  9. History of spinal stenosis with surgery sometime I believe in early      2009.   I believe she is stable.  We do need an adenosine Myoview scan.  I have  encouraged her to go about usual activities, however.  I will see her  back for followup.     Luis Abed, MD, [facility]  Electronically Signed    JDK/MedQ  DD: 04/02/2008  DT: 04/03/2008  Job #: [id number]

## 2010-07-26 NOTE — Assessment & Plan Note (Signed)
[facility]                            CARDIOLOGY OFFICE NOTE   NAME:[name], [name]                     MRN:          [id number]  DATE:04/28/2008                            DOB:          1933/10/18    [name] is seen for cardiology followup.  I had seen her in the  office on April 02, 2008.  At that time, we decided to proceed with an  adenosine Myoview scan.  This was done on April 08, 2008.  This  revealed no significant ischemia.  On the evening of April 18, 2008,  the patient had palpitations and came to the hospital.  There was no MI.  She described 3 episodes of her heart rate up to 120 with a blood  pressure up to 200.  She is under significant stress.  In the hospital,  she had no MI.  Decision was made to let her go home.  She does have  significant GERD.  On this basis, her diltiazem was stopped and a beta  blocker was added.  The first day, she felt poorly, but now is  tolerating 12.5 mg of Lopressor b.i.d.   PAST MEDICAL HISTORY:   ALLERGIES:  No known drug allergies.   MEDICATIONS:  Lopressor 12.5 b.i.d., Coumadin, Prilosec, fish oil, and  eye drops.   OTHER MEDICAL PROBLEMS:  See the complete list on the note of April 02, 2008.   REVIEW OF SYSTEMS:  She is feeling well today.  She has no significant  problems.  She has no fevers or chills.  There is no headache.  She has  no skin rashes.  There is no GI or GU symptoms.  Her current may be  slightly better.  Otherwise, review of systems is negative.   PHYSICAL EXAMINATION:  VITAL SIGNS:  Blood pressure is 110/70.  Pulse is  64.  GENERAL:  The patient is oriented to person, time, and place.  Affect is  normal.  HEENT:  No xanthelasma.  She has normal extraocular motion.  NECK:  There are no carotid bruits.  There is no jugular venous  distention.  LUNGS:  Clear.  Respiratory effort is not labored.  CARDIAC:  An S1 with an S2.  There are no clicks or significant  murmurs.  ABDOMEN:  Soft.  There are no masses or bruits.  EXTREMITIES:  She has no peripheral edema.   Problems are listed on the note of April 02, 2008.  1. History of mild coronary disease.  Recent adenosine study revealed      no ischemia.  2. LDL of 124.  She needs followup and consideration of a statin.  3. Paroxysmal atrial fibrillation.  She had some symptoms at home, but      we did not document atrial fibrillation.  She is on Coumadin.  4. Coumadin.  5. Gastroesophageal reflux disease.   The patient's diltiazem has been stopped.  She is on low-dose beta-  blocker.  She is stable.  I will see her back in 8 weeks to be sure that  all of her symptoms are  stable.     Luis Abed, MD, [facility]  Electronically Signed    JDK/MedQ  DD: 04/28/2008  DT: 04/29/2008  Job #: [id number]   cc:   Chales Salmon. Abigail Miyamoto, M.D.

## 2010-07-26 NOTE — H&P (Signed)
NAMEHANAA, PAYES NO.:  [id number]   MEDICAL RECORD NO.:  [id number]          PATIENT TYPE:  OBV   LOCATION:  2036                         FACILITY:  [facility]   PHYSICIAN:  Christell Faith, MD   DATE OF BIRTH:  1933/11/10   DATE OF ADMISSION:  04/18/2008  DATE OF DISCHARGE:  04/19/2008                              HISTORY & PHYSICAL   PRIMARY CARDIOLOGIST:  Luis Abed, MD, [facility]   PRIMARY CARE PHYSICIAN:  Chales Salmon. Abigail Miyamoto, M.D   CHIEF COMPLAINT:  Chest pain and palpitations.   HISTORY OF PRESENT ILLNESS:  This is a 77 year old female with past  medical history of paroxysmal atrial fibrillation, hypertension, mild  coronary artery disease, GERD, and anxiety, who presents with chest pain  and palpitations.  Chest pain has been ongoing for several weeks and is  mentioned in the [facility] office note of April 02, 2008.  Pain discribed as a retrosternal pressure.  There were no precipitating  factors.  Nothing exacerbates the pain.  Pain is relieved by distraction  for example, if she thinks about or does something else for a while, the  pain will usually go away.  Pain is not associated with exertion,  nausea, shortness of breath, or diaphoresis.  She has had 3 episodes  today of palpitations during which she felt her heart pounding and found  her pulse to be above 120 beats per minute and her systolic blood  pressure to be 200.  She has also had a hiccup-like spasm which occurs  approximately every 10 seconds for 3-4 minutes at a time for the past 3  months which has been bothering her which she associates with an  increase in her dose of  Prilosec.  She endorses significant life stress  and feels that this too may be contributing to her symptoms.   PAST MEDICAL HISTORY:  1. Coronary artery disease, which is mild nonobstructive.  Per cath of      September 2008, there was a 40%  first diagonal lesion.  No other      occlusion.  She has also had  a recent stress test, which per the      patient was normal.  2. Hyperlipidemia.  LDL is 124, not on statins.  3. Paroxysmal atrial fibrillation, she is on Coumadin.  4. Hypertension.  5. Osteoporosis.  6. Anxiety.  7. GERD.  8. Spinal stenosis, status post decompressive laminectomy of the L4      through L5 in March 2009.   HOME MEDICATIONS:  1. Coumadin.  2. Prilosec 40 mg b.i.d.  3. Diltiazem 240 mg daily.  4. Calcium and vitamin D.  5. Fish oil.  6. Multivitamin.  7. PreserVision.   SOCIAL HISTORY:  Ms. Hankinson lives in [place] with her husband.  She  is a retired Visual merchandiser.  She is a never smoker.  She drinks  1-3 alcoholic beverages per month.  She uses no illicit drugs.  She  reports occasional exercise on the treadmill at approximately 1 mile per  hour.  She reports a healthy diet.  She takes no over-the-counter or  herbal medications other than those already  mentioned.   FAMILY MEDICAL HISTORY:  Noncontributory.   REVIEW OF SYSTEMS:  CONSTITUTIONAL:  Positive for a weight loss of 5-10  pounds over the last 3-4 months, which is unintentional.  HEENT:  There  is no headache.  No change in vision or hearing.  CARDIOPULMONARY:  There is no shortness of breath.  No dyspnea on exertion.  No paroxysmal  nocturnal dyspnea.  No edema.  No syncope or presyncope.  Positive only  for chest pain.  NEUROPSYCH:  Positive for anxiety.  GI:  Positive for  occasional nausea and hiccups.  All other systems negative.   PHYSICAL EXAMINATION:  VITAL SIGNS:  Temperature 97.8, pulse 90,  respiratory rate 18, blood pressure 196/80, and oxygen saturation 97% on  2 L nasal cannula.  GENERAL:  The patient is in no acute distress.  HEENT:  Head is normocephalic and atraumatic.  Pupils equal, round, and  reactive.  Extraocular motion intact.  Sclerae are clear.  Mucous  membranes are moist.  NECK:  Supple.  There is no bruit.  No JVD.  No lymphadenopathy.  CARDIOVASCULAR:   At this time, heart rate is regular with S1 and S2.  There are no murmurs, rubs, or gallops.  LUNGS:  Clear to auscultation bilaterally.  ABDOMEN:  Soft and nontender with normal bowel sounds.  EXTREMITIES:  There is no cyanosis, clubbing, or edema.  NEURO:  The patient is alert and oriented x4.  Cranial nerves are  intact.  Strength is 5/5 in all extremities.  Sensation is normal and  intact throughout.   EKG shows rate of 79 with a regular rhythm.  There are very small ST  depressions in V5, V6, and aVF.   LABORATORY DATA:  Initial point-of-care markers are negative.  INR is  3.2.   ASSESSMENT AND PLAN:  This is a 77 year old female with past medical  history of paroxysmal atrial fibrillation, hypertension, and mild  coronary artery disease, who presents with chest pain and palpitations.  1. Atrial fibrillation.  She is currently in sinus rhythm at a rate of      80-70.  We will continue with diltiazem and Coumadin.  2. Hypertension.  Systolic blood pressure currently 196.  We will      start hydrochlorothiazide 25 mg.  If this does not work well to      control her blood pressure, we will also start lisinopril.  3. Chest pain.  Suspect this is related to gastroesophageal reflux      disease/anxiety especially in light of her negative catheterization      in 2008 and recent stress test, which was reportedly negative.  At      this time, we will rule out for acute coronary syndrome with      cardiac enzymes and EKG.  We will repeat EKG in the morning      hopefully with decreased blood pressure, ST depression noted above      will resolve as this may actually be the result of coronary artery      vasospasm versus the effect of hypertension.  4. Gastroesophageal reflux disease.  We will continue proton pump      inhibitor.  5. Anxiety.  We will continue p.r.n. Xanax, which she takes at home.  6. Dictated by K. Clent Ridges, resident, attending will be Lovena Neighbours MD      Elby Showers,  MD  Electronically Signed  Christell Faith, MD  Electronically Signed    CW/MEDQ  D:  04/18/2008  T:  04/19/2008  Job:  [id number]

## 2010-07-26 NOTE — Op Note (Signed)
NAMEERNEST, [name]              ACCOUNT NO.:  [id number]   MEDICAL RECORD NO.:  [id number]          PATIENT TYPE:  OIB   LOCATION:  3535                         FACILITY:  [facility]   PHYSICIAN:  Cristi Loron, M.D.DATE OF BIRTH:  [birth year]   DATE OF PROCEDURE:  05/29/2007  DATE OF DISCHARGE:                               OPERATIVE REPORT   BRIEF HISTORY:  The patient is a 77 year old white female who has  suffered from hip and leg pain consistent with a neurogenic  claudication.  She failed medical management, was worked up with lumbar  MRI which demonstrates  spinal stenosis at L4-5.  I discussed the  various treatment options with the patient including surgery.  She has  weighed the risks, benefits, and alternatives of the surgery and would  like to proceed with a decompressive laminectomy.   PREOPERATIVE DIAGNOSES:  L4-5 disk degeneration, spinal stenosis, lumbar  radiculopathy/myelopathy, lumbago.   POSTOPERATIVE DIAGNOSES:  L4-5 disk degeneration, spinal stenosis,  lumbar radiculopathy/myelopathy, lumbago.   PROCEDURE:  L4 laminectomy with decompression of the bilateral L4 and L5  nerve roots using microdissection.   SURGEON:  Cristi Loron, M.D.   ASSISTANT:  Hewitt Shorts, M.D.   ANESTHESIA:  General endotracheal.   ESTIMATED BLOOD LOSS:  50 cc.   SPECIMENS:  None.   DRAINS:  None.   COMPLICATIONS:  None.   DESCRIPTION OF PROCEDURE:  The patient was brought to the operative room  by the anesthesia team.  General endotracheal anesthesia was induced.  The patient was turned to the prone position on the Wilson frame.  The  lumbosacral region was then prepared with Betadine scrub and Betadine  solution.  Sterile drapes were applied.  I then injected the area to be  incised with Marcaine with epinephrine solution.  I used a scalpel to  make a linear, midline incision over the L4-5 interspace.  I used  electrocautery to perform a bilateral  subperiosteal dissection exposing  spinous process lamina of L4.  We obtained intraoperative radiograph to  confirm our location and then inserted the [facility] retractor for  exposure.   We then brought the operating microscope into the field and under its  magnification, illumination, we completed the  microdissection/decompression.  We incised the L3-4, and L4-5 interspace  ligament with a scalpel.  Then used Leksell rongeur to remove the L4  lamina.  I then used high-speed drill to perform bilateral L4  laminotomies.  We completed the L4 laminectomy with a Kerrison punch and  removed L4-5 and L3-4 ligamentum flavum.  We then removed the excess of  ligament from the lateral recesses and performed foraminotomies about  the bilateral L4 and L5 nerve roots.  We then palpated along the ventral  surface, thecal sac, and inspected the L3-4 and L4-5 intervertebral  disk.  There were no significant herniations.  We palpated along the  exit routes of the L4-L5 nerve roots, and noted they were well  decompressed.  We then obtained hemostasis using bipolar electrocautery.  We irrigated the wound out with bacitracin solution.  We removed the  retractor and  then reapproximated the patient's thoracolumbar fascia  with interrupted #1 Vicryl suture and subcutaneous tissue with  interrupted 2-0 Vicryl suture and the skin with Steri-Strips and  Benzoin.  The wound was then coated with bacitracin ointment.  Sterile  dressings applied.  The drapes were removed.  The patient was  subsequently returned to supine position where she was extubated by the  anesthesia team and transported to postanesthesia care unit in stable  condition.  All sponge, instrument, and needle counts were correct in  this case.      Cristi Loron, M.D.  Electronically Signed     JDJ/MEDQ  D:  05/29/2007  T:  05/29/2007  Job:  [id number]

## 2010-07-26 NOTE — Discharge Summary (Signed)
[name], [name]              ACCOUNT NO.:  [id number]   MEDICAL RECORD NO.:  [id number]          PATIENT TYPE:  INP   LOCATION:  3731                         FACILITY:  [facility]   PHYSICIAN:  Gerrit Friends. Dietrich Pates, MD, FACCDATE OF BIRTH:  1933/05/16   DATE OF ADMISSION:  11/28/2006  DATE OF DISCHARGE:  12/01/2006                               DISCHARGE SUMMARY   CARDIOLOGIST:  Dr. Willa Rough.   PRIMARY CARE PHYSICIAN:  Chales Salmon. Abigail Miyamoto, M.D.   REASON FOR ADMISSION:  Palpitations and jaw pain.   DISCHARGE DIAGNOSES:  1. Nonobstructive coronary artery disease by catheterization.  2. Good left ventricular function.  3. Paroxysmal atrial fibrillation.  On Coumadin therapy.  4. CHADS-11 score is one.  5. Hypertension.  6. Minimally elevated lipids.  No medical therapy at this time other      than fish oil.  7. Osteoporosis.  8. Guillan-Barre disease.  9. Anxiety.  10.Gastroesophageal reflux disease.[name]   PROCEDURES PERFORMED THIS ADMISSION:  Cardiac catheterization by Dr.  Tonny Bollman on November 30, 2006.  Please see his dictated note for  complete details.  Briefly, the patient had a 40% lesion in the mid-LAD.  Otherwise her circumflex and RCA were both normal.  EF was normal.   ADMISSION HISTORY:  Please see the H&P for complete details.  Briefly,  Ms. Sibert is a 77 year old female patient with a history of  paroxysmal atrial fibrillation on chronic Coumadin.  She presented to  the office on the date of admission with complaints of squeezing throat  and jaw discomfort, followed by profound nausea.  This happened on a  couple of occasions and again in the office when she was seen.  Therefore she was admitted to [facility] for further evaluation  and treatment.   HOSPITAL COURSE:  The patient's Coumadin was held in anticipation of  cardiac catheterization.  She ruled out for myocardial infarction by  enzymes.  She did develop bradycardia and hypotension with  Imdur and  Lopressor, and these were both discontinued.  Her hypotension and  bradycardia improved off of these medications.   She did have an echocardiogram performed.  This revealed an EF of 65%.  There was flat closure of the mitral valve but no significant mitral  valve prolapse.  The patient's INR drifted down to 1.4 by November 29, 2006.  She was referred for cardiac catheterization by Dr. Tonny Bollman.  As noted above, she had nonobstructive coronary disease.  Therefore she was treated medically.  Her Coumadin was reinitiated.  She  was evaluated by Dr. Dietrich Pates on December 01, 2078, who felt she was  stable enough for discharge to home.  He did provide her with a  prescription for nitroglycerin p.r.n. chest pain.   LABORATORY DATA:  INR at discharge 1.2, sodium 140, potassium 3.5,  glucose 103, BUN 14, creatinine 0.63, total cholesterol 189,  triglycerides 48, HDL 57, LDL 122.  TSH 1.77.   CHEST X-RAY:  On admission --  No active cardiopulmonary disease   DISCHARGE MEDICATIONS:  1. Cartia XT 240 mg daily.  2. Prilosec  20 mg daily.  3. Calcium plus vitamin D daily.  4. Fish oil daily.  5. Multivitamin daily.  6. Coumadin as directed.  7. Cymbalta 20 mg daily.  8. Aspirin 81 mg daily.  9. Nitroglycerin p.r.n. chest pain.   DIET:  Low-fat, low-sodium diet.   ACTIVITY:  She is to increase activity slowly.  No heavy lifting or  sexual activity for one week.  No driving for 2 days.  She may shower.  She may walk up steps.   WOUND CARE:  She is to call our office for any groin pain, bleeding,  bruising or fever.   FOLLOW-UP:  Will be with Dr. Myrtis Ser in the next 2-4 weeks; our office will  contact an appointment.  Will need to decide on treatment of her lipids  when she is seen back in follow-up with Dr. Myrtis Ser.  At this point and  time she is only fish oil.  She does have nonobstructive coronary artery  disease as well as an LDL over 70.  We will make a decision  regarding  treatment of her lipids once she is seen back in follow-up with Dr.  Myrtis Ser.   She will follow up at the [facility] the next week, and the office  will contact for the appointment.   She should follow up with Dr. Abigail Miyamoto as directed.      Tereso Newcomer, PA-C      Gerrit Friends. Dietrich Pates, MD, [facility]  Electronically Signed    SW/MEDQ  D:  12/01/2006  T:  12/02/2006  Job:  [id number]   cc:   Chales Salmon. Abigail Miyamoto, M.D.

## 2010-07-26 NOTE — Assessment & Plan Note (Signed)
[facility]                            CARDIOLOGY OFFICE NOTE   NAME:MCKENZIEIfeoma, Vallin                     MRN:          [id number]  DATE:06/27/2007                            DOB:          Aug 20, 1933    Ms. Hayashi is doing well.  She is approximately 4 weeks after having  spinal stenosis surgery by Dr. Tressie Stalker.  She is recovering  slowly.  She did have some palpitations compatible with her atrial fib  at home approximately a week after her surgery.  She has been stable  since then.  She is on Coumadin for her atrial fib.   She is not having any chest pain, syncope or presyncope.   PAST MEDICAL HISTORY:   ALLERGIES:  No known drug allergies.   MEDICATIONS:  Coumadin, Prilosec, Diltiazem 240, calcium, fish oil,  vitamins.   OTHER MEDICAL PROBLEMS:  See the list below.   REVIEW OF SYSTEMS:  She is doing well.  Her review of systems is  negative.   PHYSICAL EXAM:  Blood pressure is 130/80 with a pulse of 62.  The patient is oriented to person, time and place.  Affect is normal.  HEENT:  Reveals no xanthelasma.  She has normal extraocular motion.  There are no carotid bruits.  There is no jugular venous distention.  Lungs are clear.  Respiratory effort is not labored.  CARDIAC:  Exam reveals S1-S2.  There are no clicks or significant  murmurs.  The abdomen is soft.  She has no peripheral edema.   EKG reveals sinus rhythm with mild decreased R waves in the anterior  precordial leads.   PROBLEMS:  1. Nonobstructive coronary disease by cath in September 2008 with a      40% first diagonal.  She is not on a statin yet and I will discuss      this with her.  2. Paroxysmal atrial fibrillation.  This is infrequent.  She is on      Coumadin.  3. Coumadin therapy.  4. Hypertension treated.  5. Low density lipids.  She prefers not to use but lipid lowering      meds, but a statin would be recommended and will remind her of      this.  6.  Osteoporosis.  7. History of some mild anxiety, that is stable.  8. Gastroesophageal reflux disease.  9. It is of note that on 1 of the notes, there is question in a      problem list of Guillain-Barre in the past.  There is no history of      Guillain-Barre.  10.History of spinal stenosis treated with surgery recently.   I will see her back in 1 year.     Luis Abed, MD, [facility]  Electronically Signed    JDK/MedQ  DD: 06/27/2007  DT: 06/27/2007  Job #: [id number]   cc:   [employer]

## 2010-07-26 NOTE — Cardiovascular Report (Signed)
[name], [name]              ACCOUNT NO.:  [id number]   MEDICAL RECORD NO.:  [id number]          PATIENT TYPE:  INP   LOCATION:  3731                         FACILITY:  [facility]   PHYSICIAN:  Veverly Fells. Excell Seltzer, MD  DATE OF BIRTH:  11-Oct-1933   DATE OF PROCEDURE:  DATE OF DISCHARGE:                            CARDIAC CATHETERIZATION   PROCEDURE:  Left heart catheterization, selective coronary angiography,  left ventricular angiography.   INDICATIONS:  Ms. Gethers is a 77 year old woman with paroxysmal atrial  fib.  She is anticoagulated with Coumadin.  She has had jaw pain and  chest discomfort and was referred for cardiac catheterization.   Risks and indications of the procedure were reviewed with the patient.  Informed consent was obtained.  The right groin was prepped, draped,  anesthetized with 1% lidocaine.  A 5-French sheath was placed in the  right femoral artery, using the modified Seldinger technique.  Coronary  angiography was performed using standard 5-French Judkins catheters.  Following selective coronary angiography, an angled pigtail catheter was  inserted in the left ventricle where pressures were recorded.  Left  ventriculogram was performed.  Pullback across the aortic valve was  done.  The patient tolerated the procedure well and had no immediate  complications.   FINDINGS:  Aortic pressure 144/56 with a mean of 92, left ventricular  pressure 144 over 11.   CORONARY ANGIOGRAPHY:  The left mainstem is angiographically normal.  It  bifurcates into the LAD and left circumflex.   The LAD is large-caliber vessel that courses down the anterior wall and  wraps around the LV apex.  There is a large first diagonal branch that  is nearly as big as the LAD.  Following the first diagonal, there is a  area of smooth non-obstructive stenosis in the range of 40%.  The area  is mildly hypodense, secondary to calcium.  The remaining portions of  the mid and distal LAD are  free of any significant angiographic  stenosis.  There is a small second and third diagonal branch that are  present and have no significant stenoses.   The left circumflex is medium sized.  It is widely patent.  There are no  luminal irregularities.  It courses down and supplies a left  posterolateral branch.   The right coronary artery is dominant.  It is a smooth vessel with no  angiographic stenosis.  There is a small PDA branch and two small  posterolateral branches present.   Left ventricular function is normal.  The LVEF is 65%.   ASSESSMENT:  1. Nonobstructive left anterior descending stenosis.  2. Normal left circumflex and right coronary artery.  3. Normal left ventricular function.   PLAN:  Recommend medical therapy for non-obstructive CAD.      Veverly Fells. Excell Seltzer, MD  Electronically Signed     MDC/MEDQ  D:  11/30/2006  T:  12/01/2006  Job:  [id number]   cc:   Luis Abed, MD, [facility]

## 2010-07-26 NOTE — H&P (Signed)
NAMEVANNIE, [name]              ACCOUNT NO.:  [id number]   MEDICAL RECORD NO.:  [id number]          PATIENT TYPE:  INP   LOCATION:  3731                         FACILITY:  [facility]   PHYSICIAN:  Doylene Canning. Ladona Ridgel, MD    DATE OF BIRTH:  [birth year]   DATE OF ADMISSION:  11/28/2006  DATE OF DISCHARGE:                              HISTORY & PHYSICAL   PRIMARY CARE PHYSICIAN:  Dr. Abigail Miyamoto.   CARDIOLOGIST:  She was previously followed by Dr. Randa Evens; she  has to be reestablished with Dr. Willa Rough.   CHIEF COMPLAINT:  Palpitations and jaw pain.   HISTORY OF PRESENT ILLNESS:  [name] is a 77 year old female  patient with a history of paroxysmal atrial fibrillation, on chronic  Coumadin, as well as a history of palpitations that has been thought to  be secondary to symptomatic PVCs in the past, who was previously  followed by Dr. Samule Ohm.  She recently has develop more palpitations.  This usually occurs with stress.  She had more back pain over the past  several months secondary to her lumbar degenerative disk disease.  She  had worsening palpitations over this past weekend and while at a church  picnic, developed a squeezing and throat and jaw that was followed by  profound nausea; this lasted about 10 seconds and she felt fatigued  afterwards.  She notes that it occurred after eating on Sunday.  She has  had several other episodes.  Her last known episode was this morning,  shortly after breakfast.  She called and was added onto the schedule  today.  Of note, she is still having some throat tightness and nausea in  the office.  She denies any associated chest pain, diaphoresis, dyspnea.  There is no radiation to her arms.  She does note that she feels tired  and also notes tachy-palpitations associated with this.  She denies  syncope, near-syncope, orthopnea, PND or pedal edema.  Of note, she  recently started on Actonel.  She does note a history of dysphagia and  odynophagia.  She was evaluated with a barium swallow that was  apparently positive for reflux earlier this summer.  She was evaluated  by Dr. Abigail Miyamoto for this and was placed on proton pump inhibitor.  She  does have a gastroenterologist who is Dr. Kinnie Scales and last had an  endoscopy about 2-3 years ago.   PAST MEDICAL HISTORY:  She denies any history of diabetes mellitus,  coronary artery disease or stroke.  She does have a history of:  1. Hypertension.  2. Minimally elevated lipids -- no medical therapy at this time other      than fish oil.  3. Osteoporosis.  4. Degenerative disk disease.  5. Anxiety.  6. Hypertension  7. Paroxysmal atrial fibrillation.      a.     Coumadin therapy.      b.     CHADS2 score is 1.   MEDICATIONS:  1. Warfarin as directed.  2. Prilosec 20 mg a day.  3. Diltiazem CD 240 mg daily.  4. Calcium plus vitamin D.  5. Fish oil.  6. Multivitamin.  7. Vitamin D 2 g daily.  8. Actonel 35 mg a week.  9. Cymbalta 20 mg daily.   ALLERGIES:  NO KNOWN DRUG ALLERGIES.   SOCIAL HISTORY:  She denies any tobacco abuse.  She is married.   FAMILY HISTORY:  Insignificant for premature coronary disease.  She does  have a brother who is deceased as well as sister who still alive, but  both had diabetes and also both had strokes.  Her mother is deceased and  passed away in her 29s, who also had a stroke.   REVIEW OF SYSTEMS:  Please see HPI.  Denies any fevers, chills, cough,  melena, hematochezia, hematuria, dysuria, hematemesis or hemoptysis.  Rest of the review of systems are negative.   PHYSICAL EXAM:  GENERAL:  She is a well-nourished, well-developed female  in no acute distress.  VITAL SIGNS:  Blood pressure is 150/89, pulse 79, and weight 107 pounds.  HEENT:  Normal.  NECK:  Without JVD.  LYMPH:  Without lymphadenopathy.  ENDOCRINE:  Without thyromegaly.  CARDIAC:  Normal S1 and S2.  Regular rate and rhythm.  LUNGS:  Clear to auscultation bilaterally.   ABDOMEN:  Soft and nontender.  Normoactive bowel sounds.  No  organomegaly.  EXTREMITIES:  Without edema.  Calves soft and nontender.  SKIN:  Warm and dry.  NEUROLOGIC:  She is alert and oriented x3.  Cranial nerves II-XII are grossly intact.  VASCULAR:  Carotids without bruits bilaterally.   ELECTROCARDIOGRAM:  Sinus rhythm with a heart rate of 79, normal axis.  She does have some ST-segment depression in leads II, III and aVF that  is less than a millimeter, as well as V5 and V6.  This is similar to  previous tracings, the last of which was dated November 25, 2006.  There has been no significant change in the recent past.   ASSESSMENT:  1. Jaw pain.      a.     Rule out unstable angina.  2. Paroxysmal atrial fibrillation.      a.     Coumadin therapy.      b.     CHADS2 score is 1.  3. Good left ventricular function by echocardiogram in the past.  4. History of symptomatic premature ventricular contractions and      palpitations.  5. Gastroesophageal reflux disease.      a.     She does have a history of dysphagia and odynophagia.      b.     Recently started on a bisphosphonate.  6. Hypertension.  7. Anxiety.  8. Degenerative disk disease.   PLAN:  The patient was also interviewed and examined by Dr. Ladona Ridgel  today.  She presents with symptoms that are somewhat atypical for  ischemia and her symptoms likely could be secondary to a  gastrointestinal etiology, especially given her history of acid reflux  disease as well as dysphagia, odynophagia and recent initiation of  bisphosphonates.  However, with ongoing symptoms in the office today in  a 77 year old female with a history of hypertension, we think it is best  to admit her to the hospital.  At this point in time, we plan to:  1. Hold her Coumadin for now and let her INR trend downward.  2. Check serial cardiac markers to rule out myocardial infarction.  3. Proceed with cardiac catheterization once her INR is low enough.   4. We will continue her home medications and  add low-dose beta      blocker.  Of note, she had worsening fatigue in the past with beta      blockers, so we will have to use some caution.  5. We will increase her PPI to b.i.d. dosing.  6. Consider GI evaluation after cardiac evaluation is completed if      necessary.  7. Her lab work will include a CMET, PT, PTT, CBC and TSH, as well as      fasting lipids and a magnesium level.  8. INR will be checked daily until her INR is less than 1.6 and she      will start on heparin per pharmacy, once her INR is less than 2.      Tereso Newcomer, PA-C      Doylene Canning. Ladona Ridgel, MD  Electronically Signed    SW/MEDQ  D:  11/28/2006  T:  11/29/2006  Job:  [id number]   cc:   Chales Salmon. Abigail Miyamoto, M.D.

## 2010-07-26 NOTE — Op Note (Signed)
NAMEDANIELYS, MADRY NO.:  [id number]   MEDICAL RECORD NO.:  [id number]          PATIENT TYPE:  AMB   LOCATION:  ENDO                         FACILITY:  [facility]   PHYSICIAN:  Danise Edge, M.D.   DATE OF BIRTH:  June 30, 1933   DATE OF PROCEDURE:  08/13/2007  DATE OF DISCHARGE:                               OPERATIVE REPORT   REFERRING PHYSICIAN:  Juluis Rainier, MD   INDICATIONS:  Ms. Rodina Pinales is a 77 year old female born on  06-Mar-1934.  Ms. Riecke has undergone 2 previous colonoscopic  exams performed by Dr. Ritta Slot to remove neoplastic but noncancerous  colon polyps.  Ms. Vandemark is due for a surveillance colonoscopy with  polypectomy to prevent colon cancer.   ENDOSCOPIST:  Danise Edge, MD   PREMEDICATION:  Fentanyl 75 mcg, Versed 7.5 mg.   PROCEDURE:  After obtaining informed consent, Ms. Cancelliere was placed in  the left lateral decubitus position.  I administered intravenous  fentanyl and intravenous Versed to achieve conscious sedation for the  procedure.  The patient's blood pressure, oxygen saturation, and cardiac  rhythm were monitored throughout the procedure and documented in the  medical record.   Anal inspection and digital rectal exam were normal.  The Pentax  pediatric colonoscope was introduced into the rectum and advanced to the  cecum.  Advancement of the colonoscope was extremely difficult due to  colonic loop formation.  Endoscopic advancement was best when Ms.  January was placed in the supine position and external abdominal  pressure applied to the left lower quadrant of her abdomen.  Colonic  preparation for the exam today was good.   Rectum normal.  Retroflexed view of the distal rectum normal.  Sigmoid colon and descending colon normal.  Splenic flexure normal.  Transverse colon normal.  Hepatic flexure normal.  Ascending colon.  From the distal ascending colon, a 3-mm sessile polyp  was removed with  the cold snare.  Cecum and ileocecal valve normal.   ASSESSMENT:  1. A small polyp was removed from the distal ascending colon.  2. Extremely difficult colonoscopy due to colonic loop formation.   RECOMMENDATIONS:  Ms. Rosenfield should undergo repeat colorectal polyp  surveillance in approximately 5 years.  It might be safest to perform a  virtual colonoscopy in 5 years.           ______________________________  Danise Edge, M.D.     MJ/MEDQ  D:  08/13/2007  T:  08/14/2007  Job:  [id number]   cc:   Juluis Rainier, M.D.

## 2010-07-29 NOTE — Op Note (Signed)
[name], GROUNDS              ACCOUNT NO.:  [id number]   MEDICAL RECORD NO.:  [id number]          PATIENT TYPE:  AMB   LOCATION:  NESC                         FACILITY:  [facility]   PHYSICIAN:  Daniel L. [name], M.D.DATE OF BIRTH:  1933-11-10   DATE OF PROCEDURE:  02/07/2005  DATE OF DISCHARGE:                                 OPERATIVE REPORT   PREOPERATIVE DIAGNOSIS:  Postmenopausal bleeding with small endometrial  polyp.   POSTOPERATIVE DIAGNOSIS:  Postmenopausal bleeding with small endometrial  polyp.   OPERATIONS:  Hysteroscopy D&C with curetting of endometrial polyp.   SURGEON:  Daniel L. Eda Paschal, M.D.   ANESTHESIA:  General.   INDICATIONS:  The patient is a 77 year old female who had presented to the  office with a 1 month history of persistent spotting. She has a very  stenotic cervix and no endometrial sampling could be done in the office.  Ultrasound was done which revealed a significant amount of endometrial fluid  plus a strong suggestion of a small endometrial polyp. She now enters the  hospital for hysteroscopy, endometrial sampling and excision of any  pathology.   FINDINGS:  External is normal, BUS is normal. Vaginal shows significant  decreased estrogen effect, cervix is very stenotic. The uterus is normal  size and shape, adnexa are not palpably enlarged. At the time of  hysteroscopy, the patient had a very atrophic endometrial lining near the  tubal ostia on the left. There was a very small what appeared to be fragment  of an endometrial polyp which was probably less than 1 cm. This was the only  pathology seen.   DESCRIPTION OF PROCEDURE:  After adequate general anesthesia, the patient  was placed in the dorsal supine position, prepped and draped in the usual  sterile manner. A single-tooth tenaculum was placed in the anterior lip of  the cervix. The cervix was gently dilated with Hanks dilator starting with a  very small Hanks dilator and working up  to a #23. Following this, the  diagnostic hysteroscope could be introduced using 3% sorbitol to expand the  intrauterine cavity and a camera for magnification. Findings were as noted  above. Pictures were taken for documentation. Using a small Haney curette,  the endometrium was sampled including the area where there was a suggestion  of a polyp. This was not a large enough polyp to require resection. She was  rehysteroscoped after the sampling and the area in question above was  clearly sampled and almost completely removed. There was no active bleeding.  Fluid deficit was 70 mL. The patient tolerated the procedure well and left  the operating room in satisfactory condition.      Daniel L. Eda Paschal, M.D.  Electronically Signed    DLG/MEDQ  D:  02/07/2005  T:  02/07/2005  Job:  [id number]

## 2010-07-29 NOTE — Assessment & Plan Note (Signed)
[facility]                              CARDIOLOGY OFFICE NOTE   NAME:Lipsett, LYNNELLE MESMER                     MRN:          [id number]  DATE:12/14/2005                            DOB:          [birth year]    PRIMARY CARE PHYSICIAN:  Brett Canales A. Cleta Alberts, M.D., [facility].   HISTORY OF PRESENT ILLNESS:  Ms. Bina is a 77 year old lady with  paroxysmal atrial fibrillation which we treat with rate control and  anticoagulation. She tends to have approximately 1 minute episodes of  palpitations when she is tired or stressed. She feels very slightly light-  headed with these. She has never lost consciousness or fallen. No associated  chest pain or dyspnea. Pattern is the same as it has been for a number of  years.   CURRENT MEDICATIONS:  1. Coumadin followed by our Coumadin clinic.  2. Prilosec 20 mg per day.  3. Diltiazem CD 140 mg per day.  4. Calcium plus D.  5. Fish oil.  6. Multivitamin.   PHYSICAL EXAMINATION:  GENERAL:  She is generally well-appearing in no  distress.  VITAL SIGNS:  Heart rate 68, blood pressure 126/76, weight of 111 pounds.  Weight is stable. She has no jugular venous distention and no thyromegaly.  LUNGS:  Clear to auscultation. She has a regular rate and rhythm without  murmurs, rubs or gallops.  ABDOMEN:  Soft, nondistended, nontender. There is no hepatosplenomegaly.  Bowel sounds are normal.  EXTREMITIES:  Warm without edema.   Electrocardiogram demonstrates normal sinus rhythm with minor nonspecific ST-  T abnormalities diffusely.   IMPRESSION/RECOMMENDATIONS:  1. Atrial fibrillation:  Continue rate control and anticoagulation with      current medications.  2. Hypertension:  Nicely controlled.   I will plan on seeing her back in 18 months' time. She will followup with  the Coumadin clinic in the interim.      Salvadore Farber, MD    WED/MedQ  DD:  12/14/2005  DT:  12/15/2005 Job #:   [id number]

## 2010-07-29 NOTE — Op Note (Signed)
NAME:  [name], [name]                        ACCOUNT NO.:  [id number]   MEDICAL RECORD NO.:  [id number]                   PATIENT TYPE:  AMB   LOCATION:  DAY                                  FACILITY:  [facility]   PHYSICIAN:  Ollen Gross, M.D.                 DATE OF BIRTH:  01/23/1934   DATE OF PROCEDURE:  06/17/2003  DATE OF DISCHARGE:                                 OPERATIVE REPORT   PREOPERATIVE DIAGNOSES:  Right hip intractable bursitis.   POSTOPERATIVE DIAGNOSES:  Right hip intractable bursitis.   PROCEDURE:  Right hip bursectomy iliotibial band resection.   SURGEON:  Ollen Gross, M.D.   ANESTHESIA:  Spinal.   ESTIMATED BLOOD LOSS:  Minimal.   DRAINS:  Hemovac x1.   COMPLICATIONS:  None.   CONDITION:  Stable to recovery.   BRIEF CLINICAL NOTE:  [name] is a 77 year old female with a 4-5 year  history of significant right lateral hip pain with intractable bursitis. She  does have calcium deposits present in the bursa. She presents now for  bursectomy, iliotibial band resection.   DESCRIPTION OF PROCEDURE:  After  successful administration of spinal  anesthetic, the patient was placed in the left lateral decubitus position  with the right side up and held with the hip positioner.  The hip was  prepped and draped in the usual sterile fashion. A longitudinal incision was  made from the tip of the trochanter coursing distally about 4 inches.  The  skin was cut with a 10 blade through the subcutaneous tissue to the level of  the iliotibial band.  An incision was made.  There was very thickened bursa  present.  This bursa was excised. At the distal aspect of the incision,  there were three discreet calcium deposits. These were all removed.  I  palpated the rest of the trochanter and there was no evidence of any further  deposits or any bursal tissue remaining.  I inspected the insertion of the  gluteus medius onto the greater trochanter and there was no evidence of  any  detachment. We then copiously irrigated the wound and closed the distal  aspect of the iliotibial band over a Hemovac drain. Proximally I removed a  small triangular portion of tissue to avoid rubbing of the tissue over the  tip of the trochanter. We then closed the subcutaneous tissue with #1 and  then 2-0 Vicryl, subcuticular with running 4-0 Monocryl. The incision was  cleaned and dried and Steri-Strips and a bulky sterile dressing applied. She  was then awakened and transported to recovery in stable condition.                                              Ollen Gross, M.D.   FA/MEDQ  D:  06/17/2003  T:  06/17/2003  Job:  [id number]

## 2010-08-02 ENCOUNTER — Ambulatory Visit (INDEPENDENT_AMBULATORY_CARE_PROVIDER_SITE_OTHER): Payer: Medicare Other | Admitting: *Deleted

## 2010-08-02 DIAGNOSIS — I4891 Unspecified atrial fibrillation: Secondary | ICD-10-CM

## 2010-08-02 LAB — POCT INR: INR: 2.6

## 2010-08-05 ENCOUNTER — Encounter: Payer: Self-pay | Admitting: Cardiology

## 2010-08-11 ENCOUNTER — Encounter: Payer: Self-pay | Admitting: Cardiology

## 2010-08-11 DIAGNOSIS — K219 Gastro-esophageal reflux disease without esophagitis: Secondary | ICD-10-CM | POA: Insufficient documentation

## 2010-08-11 DIAGNOSIS — I739 Peripheral vascular disease, unspecified: Secondary | ICD-10-CM

## 2010-08-11 DIAGNOSIS — I779 Disorder of arteries and arterioles, unspecified: Secondary | ICD-10-CM | POA: Insufficient documentation

## 2010-08-11 DIAGNOSIS — M810 Age-related osteoporosis without current pathological fracture: Secondary | ICD-10-CM | POA: Insufficient documentation

## 2010-08-11 DIAGNOSIS — I10 Essential (primary) hypertension: Secondary | ICD-10-CM | POA: Insufficient documentation

## 2010-08-11 DIAGNOSIS — M4800 Spinal stenosis, site unspecified: Secondary | ICD-10-CM | POA: Insufficient documentation

## 2010-08-11 DIAGNOSIS — F419 Anxiety disorder, unspecified: Secondary | ICD-10-CM | POA: Insufficient documentation

## 2010-08-11 DIAGNOSIS — E785 Hyperlipidemia, unspecified: Secondary | ICD-10-CM | POA: Insufficient documentation

## 2010-08-11 DIAGNOSIS — I480 Paroxysmal atrial fibrillation: Secondary | ICD-10-CM | POA: Insufficient documentation

## 2010-08-11 DIAGNOSIS — I2510 Atherosclerotic heart disease of native coronary artery without angina pectoris: Secondary | ICD-10-CM | POA: Insufficient documentation

## 2010-08-11 DIAGNOSIS — R9430 Abnormal result of cardiovascular function study, unspecified: Secondary | ICD-10-CM | POA: Insufficient documentation

## 2010-08-11 DIAGNOSIS — Z7901 Long term (current) use of anticoagulants: Secondary | ICD-10-CM | POA: Insufficient documentation

## 2010-08-15 ENCOUNTER — Encounter: Payer: Self-pay | Admitting: Cardiology

## 2010-08-15 ENCOUNTER — Ambulatory Visit (INDEPENDENT_AMBULATORY_CARE_PROVIDER_SITE_OTHER): Payer: Medicare Other | Admitting: Cardiology

## 2010-08-15 DIAGNOSIS — I4891 Unspecified atrial fibrillation: Secondary | ICD-10-CM

## 2010-08-15 DIAGNOSIS — I10 Essential (primary) hypertension: Secondary | ICD-10-CM

## 2010-08-15 DIAGNOSIS — I480 Paroxysmal atrial fibrillation: Secondary | ICD-10-CM

## 2010-08-15 DIAGNOSIS — I779 Disorder of arteries and arterioles, unspecified: Secondary | ICD-10-CM

## 2010-08-15 DIAGNOSIS — I2510 Atherosclerotic heart disease of native coronary artery without angina pectoris: Secondary | ICD-10-CM

## 2010-08-15 NOTE — Assessment & Plan Note (Signed)
Carotid Doppler was done last year after I saw her.  She has very minimal disease.  She does not need a followup Doppler at this time.

## 2010-08-15 NOTE — Assessment & Plan Note (Signed)
The patient will check her blood pressure at home.  If the systolic continues to be over 469 she will be in touch with me or her primary physician.

## 2010-08-15 NOTE — Assessment & Plan Note (Signed)
The patient has mild coronary disease.  This is stable.  No further workup is needed.

## 2010-08-15 NOTE — Assessment & Plan Note (Signed)
Coumadin will be continued.  No further workup.

## 2010-08-15 NOTE — Progress Notes (Signed)
HPI Patient is seen for cardiology followup.  She is doing very well.  I saw her in June, 2011.  There is a history of paroxysmal atrial fibrillation and she is on Coumadin.  She does have palpitations from time to time.  Because of this her Coumadin is continued.[name]  Her blood pressure is mildly elevated today.   Not on File  Current Outpatient Prescriptions  Medication Sig Dispense Refill  . calcium carbonate (CALCIUM ANTACID) 500 MG chewable tablet Chew 2 tablets by mouth daily.        . Ergocalciferol (VITAMIN D2 PO) Take 125 mg by mouth once a week.        . estradiol (ESTRACE) 1 MG tablet Take 1 mg by mouth as needed.        . Fish Oil OIL 1 tablet 2 (two) times daily.        . metoprolol tartrate (LOPRESSOR) 25 mg/10 mL SUSP Take 0.5 mg by mouth 2 (two) times daily.        . Multiple Vitamins-Minerals (OCUVITE PRESERVISION) TABS Take 1 each by mouth daily.        . Risedronate Sodium (ATELVIA) 35 MG TBEC Take by mouth once a week.        . simvastatin (ZOCOR) 20 MG tablet Take 20 mg by mouth at bedtime.        [name] warfarin (COUMADIN) 5 MG tablet TAKE TABLETS BY MOUTH AS DIRECTED BY YOUR DOCTOR  55 tablet  2  . DISCONTD: omeprazole (PRILOSEC OTC) 20 MG tablet Take 20 mg by mouth daily.        [name] DISCONTD: omeprazole (PRILOSEC OTC) 20 MG tablet Take 20 mg by mouth 2 (two) times daily.          History   Social History  . Marital Status: Married    Spouse Name: N/A    Number of Children: N/A  . Years of Education: N/A   Occupational History  . Retired    Social History Main Topics  . Smoking status: Never Smoker   . Smokeless tobacco: Never Used  . Alcohol Use: No  . Drug Use: No  . Sexually Active: Not on file   Other Topics Concern  . Not on file   Social History Narrative   RetiredMarried    Family History  Problem Relation Age of Onset  . Stroke Other     or CVA  . Diabetes Other     Past Medical History  Diagnosis Date  . Spinal stenosis   . Anxiety   .  Warfarin anticoagulation     Coumadin therapy  . GERD (gastroesophageal reflux disease)   . Osteoporosis   . Paroxysmal atrial fibrillation     Coumadin therapy  . Hypertension   . Hyperlipidemia   . CAD (coronary artery disease) 2008/2010    Catheterization 2008, 40% first diagonal  /  nuclear January, 2010 normal  . Carotid artery disease     Doppler, July, 2011, 0-39% bilateral  . Ejection fraction     Ejection fraction 65%, echo, September, 2008, flat closure of the mitral valve, trivial mitral regurgitation    Past Surgical History  Procedure Date  . Laminectomy March 2009    L4 through L5    ROS  Patient denies fever, chills, headache, sweats, rash, change in vision, change in hearing, chest pain, cough, nausea vomiting, urinary symptoms.  All of the systems are reviewed and are negative.  PHYSICAL EXAM Patient is stable.  She is oriented to person time and place.  Affect is normal.  Head is atraumatic.  There is no xanthelasma.  Lungs are clear.  Respiratory effort is not labored.  Cardiac exam reveals S1 and S2.  The rhythm is regular.  There is no significant murmur heard.  The abdomen is soft.  No peripheral edema. Filed Vitals:   08/15/10 1456  BP: 158/60  Pulse: 60  Height: 5\' 2"  (1.575 m)  Weight: 103 lb (46.72 kg)    EKG is Done today and reviewed by me.  There is mild sinus bradycardia.  ASSESSMENT & PLAN

## 2010-08-15 NOTE — Patient Instructions (Signed)
Your physician wants you to follow-up in: 1 year. You will receive a reminder letter in the mail two months in advance. If you don't receive a letter, please call our office to schedule the follow-up appointment.  

## 2010-08-16 ENCOUNTER — Ambulatory Visit (INDEPENDENT_AMBULATORY_CARE_PROVIDER_SITE_OTHER): Payer: Medicare Other | Admitting: Gynecology

## 2010-08-16 DIAGNOSIS — N952 Postmenopausal atrophic vaginitis: Secondary | ICD-10-CM

## 2010-08-16 DIAGNOSIS — N898 Other specified noninflammatory disorders of vagina: Secondary | ICD-10-CM

## 2010-08-16 DIAGNOSIS — B373 Candidiasis of vulva and vagina: Secondary | ICD-10-CM

## 2010-08-16 DIAGNOSIS — B3731 Acute candidiasis of vulva and vagina: Secondary | ICD-10-CM

## 2010-08-30 ENCOUNTER — Ambulatory Visit (INDEPENDENT_AMBULATORY_CARE_PROVIDER_SITE_OTHER): Payer: Medicare Other | Admitting: *Deleted

## 2010-08-30 DIAGNOSIS — I4891 Unspecified atrial fibrillation: Secondary | ICD-10-CM

## 2010-09-13 ENCOUNTER — Ambulatory Visit (INDEPENDENT_AMBULATORY_CARE_PROVIDER_SITE_OTHER): Payer: Medicare Other | Admitting: *Deleted

## 2010-09-13 DIAGNOSIS — I4891 Unspecified atrial fibrillation: Secondary | ICD-10-CM

## 2010-09-13 LAB — POCT INR: INR: 1.9

## 2010-09-26 ENCOUNTER — Other Ambulatory Visit: Payer: Self-pay | Admitting: Cardiology

## 2010-10-03 ENCOUNTER — Ambulatory Visit (INDEPENDENT_AMBULATORY_CARE_PROVIDER_SITE_OTHER): Payer: Medicare Other | Admitting: *Deleted

## 2010-10-03 DIAGNOSIS — I4891 Unspecified atrial fibrillation: Secondary | ICD-10-CM

## 2010-10-24 ENCOUNTER — Ambulatory Visit (INDEPENDENT_AMBULATORY_CARE_PROVIDER_SITE_OTHER): Payer: Medicare Other | Admitting: *Deleted

## 2010-10-24 DIAGNOSIS — I4891 Unspecified atrial fibrillation: Secondary | ICD-10-CM

## 2010-11-07 ENCOUNTER — Ambulatory Visit (INDEPENDENT_AMBULATORY_CARE_PROVIDER_SITE_OTHER): Payer: Medicare Other | Admitting: *Deleted

## 2010-11-07 DIAGNOSIS — I4891 Unspecified atrial fibrillation: Secondary | ICD-10-CM

## 2010-11-21 ENCOUNTER — Ambulatory Visit (INDEPENDENT_AMBULATORY_CARE_PROVIDER_SITE_OTHER): Payer: Medicare Other | Admitting: *Deleted

## 2010-11-21 DIAGNOSIS — I4891 Unspecified atrial fibrillation: Secondary | ICD-10-CM

## 2010-11-21 LAB — POCT INR: INR: 2.6

## 2010-11-26 ENCOUNTER — Other Ambulatory Visit: Payer: Self-pay | Admitting: Cardiology

## 2010-11-28 ENCOUNTER — Other Ambulatory Visit: Payer: Self-pay | Admitting: *Deleted

## 2010-12-05 LAB — CBC
HCT: 38.8
MCV: 92.8
RBC: 4.18
WBC: 7.3

## 2010-12-05 LAB — APTT: aPTT: 30

## 2010-12-05 LAB — URINALYSIS, ROUTINE W REFLEX MICROSCOPIC
Bilirubin Urine: NEGATIVE
Hgb urine dipstick: NEGATIVE
Ketones, ur: NEGATIVE
Nitrite: NEGATIVE
Specific Gravity, Urine: 1.006
pH: 7.5

## 2010-12-05 LAB — BASIC METABOLIC PANEL
Chloride: 107
GFR calc Af Amer: >60
Potassium: 4.2
Sodium: 141

## 2010-12-19 ENCOUNTER — Ambulatory Visit (INDEPENDENT_AMBULATORY_CARE_PROVIDER_SITE_OTHER): Payer: Medicare Other | Admitting: *Deleted

## 2010-12-19 DIAGNOSIS — I4891 Unspecified atrial fibrillation: Secondary | ICD-10-CM

## 2010-12-21 LAB — PROTIME-INR
INR: 0.9
Prothrombin Time: 12.8

## 2010-12-22 LAB — CARDIAC PANEL(CRET KIN+CKTOT+MB+TROPI)
CK, MB: 1.1
Relative Index: INVALID
Relative Index: INVALID
Relative Index: INVALID
Total CK: 50
Total CK: 64
Troponin I: 0.01
Troponin I: 0.02
Troponin I: 0.02
Troponin I: <0.01

## 2010-12-22 LAB — COMPREHENSIVE METABOLIC PANEL
ALT: 17
AST: 23
Albumin: 4.2
Calcium: 9.3
Creatinine, Ser: 0.63
GFR calc Af Amer: >60
GFR calc non Af Amer: >60
Sodium: 140
Total Protein: 7.1

## 2010-12-22 LAB — CBC
MCHC: 34.5
MCV: 92.3
Platelets: 215
RBC: 4.45
RDW: 13.1

## 2010-12-22 LAB — LIPID PANEL
Cholesterol: 189
LDL Cholesterol: 122 — ABNORMAL HIGH
Triglycerides: 48
VLDL: 10

## 2010-12-22 LAB — TSH: TSH: 1.727

## 2010-12-22 LAB — PROTIME-INR
INR: 1.2
INR: 1.4
INR: 2.7 — ABNORMAL HIGH
Prothrombin Time: 17.4 — ABNORMAL HIGH

## 2010-12-22 LAB — APTT: aPTT: 45 — ABNORMAL HIGH

## 2010-12-23 LAB — I-STAT 8, (EC8 V) (CONVERTED LAB)
Acid-Base Excess: 2
Bicarbonate: 29.4 — ABNORMAL HIGH
HCT: 44
Hemoglobin: 15
Operator id: 151321
Sodium: 142
TCO2: 31

## 2010-12-23 LAB — POCT I-STAT CREATININE: Creatinine, Ser: 0.9

## 2010-12-23 LAB — DIFFERENTIAL
Eosinophils Absolute: 0
Eosinophils Relative: 0
Lymphocytes Relative: 17
Lymphs Abs: 1
Monocytes Absolute: 0.5

## 2010-12-23 LAB — POCT CARDIAC MARKERS
CKMB, poc: <1 — ABNORMAL LOW
Myoglobin, poc: 34.2
Troponin i, poc: <0.05

## 2010-12-23 LAB — CBC
HCT: 41.9
Hemoglobin: 14.4
MCV: 93.6
WBC: 6.1

## 2010-12-27 LAB — COMPREHENSIVE METABOLIC PANEL
ALT: 20
Calcium: 9.6
Creatinine, Ser: 0.68
GFR calc non Af Amer: >60
Glucose, Bld: 111 — ABNORMAL HIGH
Sodium: 145
Total Protein: 7

## 2010-12-27 LAB — CBC
Hemoglobin: 14.1
MCHC: 34.7
MCV: 91.6
RDW: 12.8

## 2010-12-27 LAB — DIFFERENTIAL
Eosinophils Absolute: 0
Lymphocytes Relative: 22
Lymphs Abs: 1.3
Monocytes Relative: 8
Neutro Abs: 4.3
Neutrophils Relative %: 70

## 2010-12-27 LAB — LIPASE, BLOOD: Lipase: 25

## 2011-01-16 ENCOUNTER — Ambulatory Visit (INDEPENDENT_AMBULATORY_CARE_PROVIDER_SITE_OTHER): Payer: Medicare Other | Admitting: *Deleted

## 2011-01-16 DIAGNOSIS — I4891 Unspecified atrial fibrillation: Secondary | ICD-10-CM

## 2011-01-16 DIAGNOSIS — Z7901 Long term (current) use of anticoagulants: Secondary | ICD-10-CM

## 2011-01-16 DIAGNOSIS — I480 Paroxysmal atrial fibrillation: Secondary | ICD-10-CM

## 2011-01-16 LAB — POCT INR: INR: 1.6

## 2011-02-06 ENCOUNTER — Ambulatory Visit (INDEPENDENT_AMBULATORY_CARE_PROVIDER_SITE_OTHER): Payer: Medicare Other | Admitting: *Deleted

## 2011-02-06 DIAGNOSIS — Z7901 Long term (current) use of anticoagulants: Secondary | ICD-10-CM

## 2011-02-06 DIAGNOSIS — I480 Paroxysmal atrial fibrillation: Secondary | ICD-10-CM

## 2011-02-06 DIAGNOSIS — I4891 Unspecified atrial fibrillation: Secondary | ICD-10-CM

## 2011-02-06 LAB — POCT INR: INR: 2.4

## 2011-03-02 ENCOUNTER — Ambulatory Visit (INDEPENDENT_AMBULATORY_CARE_PROVIDER_SITE_OTHER): Payer: Medicare Other | Admitting: *Deleted

## 2011-03-02 DIAGNOSIS — I480 Paroxysmal atrial fibrillation: Secondary | ICD-10-CM

## 2011-03-02 DIAGNOSIS — I4891 Unspecified atrial fibrillation: Secondary | ICD-10-CM

## 2011-03-02 DIAGNOSIS — Z7901 Long term (current) use of anticoagulants: Secondary | ICD-10-CM

## 2011-03-02 LAB — POCT INR: INR: 2.2

## 2011-03-12 ENCOUNTER — Ambulatory Visit (INDEPENDENT_AMBULATORY_CARE_PROVIDER_SITE_OTHER): Payer: Medicare Other

## 2011-03-12 DIAGNOSIS — I10 Essential (primary) hypertension: Secondary | ICD-10-CM

## 2011-03-12 DIAGNOSIS — R42 Dizziness and giddiness: Secondary | ICD-10-CM

## 2011-03-13 ENCOUNTER — Telehealth: Payer: Self-pay | Admitting: Cardiology

## 2011-03-13 NOTE — Telephone Encounter (Signed)
Pt to keep checking BP and writing it down and to see a PA or NP later this week per Dr Johney Frame.   Appt scheduled with Norma Fredrickson Thursday at 8:45am.   Pt was notified and agrees.

## 2011-03-13 NOTE — Telephone Encounter (Signed)
Pt is complaining of two dizzy spells which lasted 4-5 minutes accompanied by a pounding heart.  One was on Sat and one on Sun.  Her BP at the time of the dizzy spells was 225/90 and 190/?[name]  Sh states she is under a lot of stress at home currently.  She is also complaining of a skipped heart beat q20-30 minutes the past two days.  She states she was having chest tightness last night but not today.  She does not have ntg.  She has been taking Metoprolol 12.5mg  bid.  She went to urgent care last night and had a normal ekg.  They added HCTZ 12.5mg  daily.  She has not taken her meds or her BP yet today.

## 2011-03-13 NOTE — Telephone Encounter (Signed)
New Msg: Pt calling stating that she has been having some dizzy spells as well as high BP (225/90)over the weekend. Pt said she went to urgent care and was instructed to call our office and see if pt can be seen today. Please return pt call to discuss further.

## 2011-03-16 ENCOUNTER — Ambulatory Visit (INDEPENDENT_AMBULATORY_CARE_PROVIDER_SITE_OTHER): Payer: Medicare Other | Admitting: Nurse Practitioner

## 2011-03-16 ENCOUNTER — Encounter: Payer: Self-pay | Admitting: Nurse Practitioner

## 2011-03-16 VITALS — BP 156/80 | HR 80 | Ht 62.0 in | Wt 98.8 lb

## 2011-03-16 DIAGNOSIS — I4891 Unspecified atrial fibrillation: Secondary | ICD-10-CM

## 2011-03-16 DIAGNOSIS — I10 Essential (primary) hypertension: Secondary | ICD-10-CM | POA: Diagnosis not present

## 2011-03-16 DIAGNOSIS — I480 Paroxysmal atrial fibrillation: Secondary | ICD-10-CM

## 2011-03-16 MED ORDER — METOPROLOL TARTRATE 25 MG PO TABS: 25.0000 mg | ORAL_TABLET | Freq: Two times a day (BID) | ORAL | Status: DC

## 2011-03-16 NOTE — Progress Notes (Signed)
[name] Date of Birth: 07-Dec-1933 Medical Record #[id number]  History of Present Illness: [name] is seen today for a work in visit. She is seen for Dr. Myrtis Ser. She has a history of CAD, HTN, PAF, coumadin anticoagulation and palpitations. EF is 65% per echo back in 2008. Normal stress test in 2010 with remote cath in 2008.  She called in earlier this week complaining of dizzy spells. These lasted 4 to 5 minutes and are accompanied by a pounding heart beat. She had this on Saturday and Sunday. Blood pressure was grossly elevated during these spells at 225/90 and 190/?[name] She has been under more stress at home. Her husband had a stroke in early December. He is now home and she is providing lots of care to him. She is anxious about this. Has been having some palpitations. Some chest tightness over the weekend but she feels like it was more stress related. Only on Metoprolol 12.5 mg BID. Had gone to the Urgent Care Sunday night where her EKG was reportedly normal. HCTZ was added at 12.5 mg. She now presents for further evaluation. Blood pressure diary at home is reviewed. Readings remain up. She did not take the HCTZ today. Continues to have palpitations.    Current Outpatient Prescriptions on File Prior to Visit  Medication Sig Dispense Refill  . calcium carbonate (CALCIUM ANTACID) 500 MG chewable tablet Chew 2 tablets by mouth daily.        . Cyanocobalamin (VITAMIN B-12 PO) Take by mouth.        . Ergocalciferol (VITAMIN D2 PO) Take 125 mg by mouth every 14 (fourteen) days.       [name] estradiol (ESTRACE) 0.1 MG/GM vaginal cream Place 2 g vaginally daily.        . Fish Oil OIL 1 tablet 2 (two) times daily.       . hydrochlorothiazide (MICROZIDE) 12.5 MG capsule Take 12.5 mg by mouth daily.        . metoprolol tartrate (LOPRESSOR) 25 MG tablet TAKE 1/2 TABLET BY MOUTH TWICE DAILY  30 tablet  5  . Multiple Vitamins-Minerals (OCUVITE PRESERVISION) TABS Take 1 each by mouth daily.        .  Risedronate Sodium (ATELVIA) 35 MG TBEC Take by mouth once a week.        . simvastatin (ZOCOR) 20 MG tablet take 1 tablet by mouth at bedtime  30 tablet  4  . warfarin (COUMADIN) 5 MG tablet TAKE TABLETS BY MOUTH AS DIRECTED BY YOUR DOCTOR  55 tablet  2  . DISCONTD: estradiol (ESTRACE) 1 MG tablet Take 1 mg by mouth as needed.       [name] DISCONTD: metoprolol tartrate (LOPRESSOR) 25 mg/10 mL SUSP Take 0.5 mg by mouth 2 (two) times daily.          No Known Allergies  Past Medical History  Diagnosis Date  . Spinal stenosis   . Anxiety   . Warfarin anticoagulation     Coumadin therapy  . GERD (gastroesophageal reflux disease)   . Osteoporosis   . Paroxysmal atrial fibrillation     Coumadin therapy  . Hypertension   . Hyperlipidemia   . CAD (coronary artery disease) 2008/2010    Catheterization 2008, 40% first diagonal  /  nuclear January, 2010 normal  . Carotid artery disease     Doppler, July, 2011, 0-39% bilateral  . Ejection fraction     Ejection fraction 65%, echo, September, 2008, flat closure of  the mitral valve, trivial mitral regurgitation  . Endometrial polyp   . Atrophic vaginitis   . Osteopenia   . Vitamin D deficiency     Past Surgical History  Procedure Date  . Laminectomy March 2009    L4 through L5  . Cataract surgery 2009  . Tonsillectomy   . Hysteroscopy 02/07/2005    HYST W D&C, DX: PMB W SMALL ENDO POLYP-SURGEON [name]  . Breast surgery     BENIGN BREAST LUMP  . Excision morton's neuroma 2000  . Hip surgery 2005    History  Smoking status  . Never Smoker   Smokeless tobacco  . Never Used    History  Alcohol Use No    Family History  Problem Relation Age of Onset  . Stroke Other     or CVA  . Diabetes Other   . Transient ischemic attack Mother   . Cancer Father     PROSTATE    Review of Systems: The review of systems is per the HPI.  All other systems were reviewed and are negative.  Physical Exam: BP 156/80  Pulse 80  Ht 5\' 2"   (1.575 m)  Wt 98 lb 12.8 oz (44.815 kg)  BMI 18.07 kg/m2 Patient is very pleasant and in no acute distress. She is quite thin. She is anxious. Skin is warm and dry. Color is normal.  HEENT is unremarkable. Normocephalic/atraumatic. PERRL. Sclera are nonicteric. Neck is supple. No masses. No JVD. Lungs are clear. Cardiac exam shows a regular rate and rhythm. Abdomen is soft. Extremities are without edema. Gait and ROM are intact. No gross neurologic deficits noted.  LABORATORY DATA:   Assessment / Plan:

## 2011-03-16 NOTE — Assessment & Plan Note (Signed)
She does have palpitations. We are increasing the metoprolol today. She is on coumadin.

## 2011-03-16 NOTE — Assessment & Plan Note (Signed)
Blood pressure is elevated. She has associated palpitations. I think this is mostly due to the added stress that she is now under.  I have increased her metoprolol to 25 mg BID. I think this will help with both issues. She is not going to take the HCTZ for now. We spent a long time discussing the need for her to take care of herself. She has extra help at home. She is getting sleep at night. I will see her back in about 2 weeks. She will continue to monitor her blood pressure at home. Her cuff does correlate. Patient is agreeable to this plan and will call if any problems develop in the interim.

## 2011-03-16 NOTE — Patient Instructions (Signed)
Lets increase the Metoprolol to a whole tablet two times a day.  I will see you in about 2 weeks.  Continue to monitor your blood pressure at home.   Call the [facility] office at [phone] if you have any questions, problems or concerns.

## 2011-03-20 DIAGNOSIS — K219 Gastro-esophageal reflux disease without esophagitis: Secondary | ICD-10-CM | POA: Diagnosis not present

## 2011-03-20 DIAGNOSIS — R490 Dysphonia: Secondary | ICD-10-CM | POA: Diagnosis not present

## 2011-03-21 ENCOUNTER — Telehealth: Payer: Self-pay | Admitting: Cardiology

## 2011-03-21 NOTE — Telephone Encounter (Signed)
Will try the metoprolol TID. She should have a follow up appointment.

## 2011-03-21 NOTE — Telephone Encounter (Signed)
Pt was notified and does have an appt next week.

## 2011-03-21 NOTE — Telephone Encounter (Signed)
Pt states her BP is still spiking (180/79, 173/75, 175/76), she is still dizzy and her palpitations are worse.  She also states her chest pain is more frequent.  It is in the center of her chest, lasts for a minute, is relieved by rest and describes it as a spasm.

## 2011-03-21 NOTE — Telephone Encounter (Signed)
New problem Pt said she had some tightness in her chest that  comes and goes. She said her BP 207/87 no sob please call

## 2011-04-03 ENCOUNTER — Ambulatory Visit (INDEPENDENT_AMBULATORY_CARE_PROVIDER_SITE_OTHER): Payer: Medicare Other | Admitting: Nurse Practitioner

## 2011-04-03 ENCOUNTER — Ambulatory Visit (INDEPENDENT_AMBULATORY_CARE_PROVIDER_SITE_OTHER): Payer: Medicare Other | Admitting: *Deleted

## 2011-04-03 ENCOUNTER — Encounter: Payer: Self-pay | Admitting: Nurse Practitioner

## 2011-04-03 DIAGNOSIS — R002 Palpitations: Secondary | ICD-10-CM | POA: Diagnosis not present

## 2011-04-03 DIAGNOSIS — I4891 Unspecified atrial fibrillation: Secondary | ICD-10-CM

## 2011-04-03 DIAGNOSIS — R079 Chest pain, unspecified: Secondary | ICD-10-CM

## 2011-04-03 DIAGNOSIS — Z7901 Long term (current) use of anticoagulants: Secondary | ICD-10-CM

## 2011-04-03 DIAGNOSIS — I2510 Atherosclerotic heart disease of native coronary artery without angina pectoris: Secondary | ICD-10-CM

## 2011-04-03 DIAGNOSIS — I10 Essential (primary) hypertension: Secondary | ICD-10-CM | POA: Diagnosis not present

## 2011-04-03 DIAGNOSIS — I480 Paroxysmal atrial fibrillation: Secondary | ICD-10-CM

## 2011-04-03 LAB — BASIC METABOLIC PANEL
BUN: 12 mg/dL (ref 6–23)
CO2: 29 mEq/L (ref 19–32)
Calcium: 9.4 mg/dL (ref 8.4–10.5)
Chloride: 106 mEq/L (ref 96–112)
Creatinine, Ser: 0.6 mg/dL (ref 0.4–1.2)
GFR: 107.45 mL/min (ref >60.00)
Glucose, Bld: 91 mg/dL (ref 70–99)
Potassium: 4.2 mEq/L (ref 3.5–5.1)
Sodium: 143 mEq/L (ref 135–145)

## 2011-04-03 LAB — POCT INR: INR: 2.6

## 2011-04-03 MED ORDER — HYDROCHLOROTHIAZIDE 12.5 MG PO CAPS: 12.5000 mg | ORAL_CAPSULE | Freq: Every day | ORAL | Status: DC

## 2011-04-03 NOTE — Assessment & Plan Note (Signed)
Having some atypical chest tightness. Does have CV risk factors with known CAD, although mild per remote cath. Will update her stress test. She is agreeable.

## 2011-04-03 NOTE — Assessment & Plan Note (Addendum)
Blood pressure not at goal. We will start the low dose HCTZ at 12.5 mg. BMET is checked today. She will continue to monitor and I will see her back in 3 weeks. Patient is agreeable to this plan and will call if any problems develop in the interim.

## 2011-04-03 NOTE — Assessment & Plan Note (Signed)
This seems better with the metoprolol.

## 2011-04-03 NOTE — Progress Notes (Signed)
[name] Date of Birth: 1933-07-10 Medical Record #[id number]  History of Present Illness: [name] is seen today for a follow up visit. She is seen for Dr. Myrtis Ser. I saw here earlier this month with palpitations. We started Metoprolol. She has been under more stress in caring for her husband.   She comes in today. She seems to be a little better. She had called in with worsening palpitations and chest tightness. Metoprolol was increased to TID over the phone but she continues to take it just BID. She says she is having more better days. Husband is improving. Blood pressure readings at home are a little labile and most of her readings are not at goal. She does describe this chest tightness sensation and makes a throat noise with relief?? She has seen ENT and was told her throat was ok. She remains a little fatigued. Last stress test was 3 years ago . Has mild CAD per remote cath.   Current Outpatient Prescriptions on File Prior to Visit  Medication Sig Dispense Refill  . calcium carbonate (CALCIUM ANTACID) 500 MG chewable tablet Chew 2 tablets by mouth daily.        . Cyanocobalamin (VITAMIN B-12 PO) Take by mouth.        . Ergocalciferol (VITAMIN D2 PO) Take 125 mg by mouth every 14 (fourteen) days.       [name] estradiol (ESTRACE) 0.1 MG/GM vaginal cream Place 2 g vaginally as needed.       . Fish Oil OIL Take 1 tablet by mouth daily.       . metoprolol tartrate (LOPRESSOR) 25 MG tablet Take 1 tablet (25 mg total) by mouth 2 (two) times daily.  60 tablet  5  . Multiple Vitamins-Minerals (OCUVITE PRESERVISION) TABS Take 1 each by mouth daily.        . simvastatin (ZOCOR) 20 MG tablet take 1 tablet by mouth at bedtime  30 tablet  4  . warfarin (COUMADIN) 5 MG tablet TAKE TABLETS BY MOUTH AS DIRECTED BY YOUR DOCTOR  55 tablet  2  . DISCONTD: estradiol (ESTRACE) 1 MG tablet Take 1 mg by mouth as needed.         No Known Allergies  Past Medical History  Diagnosis Date  . Spinal stenosis   .  Anxiety   . Warfarin anticoagulation     Coumadin therapy  . GERD (gastroesophageal reflux disease)   . Osteoporosis   . Paroxysmal atrial fibrillation     Coumadin therapy  . Hypertension   . Hyperlipidemia   . CAD (coronary artery disease) 2008/2010    Catheterization 2008, 40% first diagonal  /  nuclear January, 2010 normal  . Carotid artery disease     Doppler, July, 2011, 0-39% bilateral  . Ejection fraction     Ejection fraction 65%, echo, September, 2008, flat closure of the mitral valve, trivial mitral regurgitation  . Endometrial polyp   . Atrophic vaginitis   . Osteopenia   . Vitamin D deficiency   . Stress     Past Surgical History  Procedure Date  . Laminectomy March 2009    L4 through L5  . Cataract surgery 2009  . Tonsillectomy   . Hysteroscopy 02/07/2005    HYST W D&C, DX: PMB W SMALL ENDO POLYP-SURGEON [name]  . Breast surgery     BENIGN BREAST LUMP  . Excision morton's neuroma 2000  . Hip surgery 2005    History  Smoking status  . Never  Smoker   Smokeless tobacco  . Never Used    History  Alcohol Use No    Family History  Problem Relation Age of Onset  . Stroke Other     or CVA  . Diabetes Other   . Transient ischemic attack Mother   . Cancer Father     PROSTATE    Review of Systems: The review of systems is positive for palpitations. This seems to be improving. Having some atypical chest tightness. She did have one episode of heart racing over the weekend that was short lived. She remains on her coumadin. No adverse bleeding or bruising reported.  All other systems were reviewed and are negative.  Physical Exam: BP 146/64  Pulse 66  Ht 5\' 2"  (1.575 m)  Wt 101 lb (45.813 kg)  BMI 18.47 kg/m2 Patient is very pleasant and in no acute distress. Not as anxious today. Skin is warm and dry. Color is normal.  HEENT is unremarkable. Normocephalic/atraumatic. PERRL. Sclera are nonicteric. Neck is supple. No masses. No JVD. Lungs are clear.  Cardiac exam shows a regular rate and rhythm. Abdomen is soft. Extremities are without edema. Gait and ROM are intact. No gross neurologic deficits noted.   LABORATORY DATA: EKG today shows sinus with nonspecific ST changes.    Assessment / Plan:

## 2011-04-03 NOTE — Patient Instructions (Signed)
We are going to check your kidney function today.  We are going to arrange for a stress test.  I want you to take the HCTZ 12.5 mg every morning.  Monitor your blood pressures at home.  I will see you in 3 weeks.  Call the [facility] office at [phone] if you have any questions, problems or concerns.

## 2011-04-04 ENCOUNTER — Other Ambulatory Visit: Payer: Self-pay | Admitting: Cardiology

## 2011-04-06 DIAGNOSIS — H3530 Unspecified macular degeneration: Secondary | ICD-10-CM | POA: Diagnosis not present

## 2011-04-06 DIAGNOSIS — H3500 Unspecified background retinopathy: Secondary | ICD-10-CM | POA: Diagnosis not present

## 2011-04-07 ENCOUNTER — Emergency Department (HOSPITAL_COMMUNITY): Payer: Medicare Other

## 2011-04-07 ENCOUNTER — Inpatient Hospital Stay (HOSPITAL_COMMUNITY)
Admission: EM | Admit: 2011-04-07 | Discharge: 2011-04-08 | DRG: 313 | Disposition: A | Discharge status: Home / Self Care | Payer: Medicare Other | Attending: Cardiology | Admitting: Cardiology

## 2011-04-07 ENCOUNTER — Encounter (HOSPITAL_COMMUNITY): Payer: Self-pay

## 2011-04-07 ENCOUNTER — Other Ambulatory Visit: Payer: Self-pay

## 2011-04-07 DIAGNOSIS — I480 Paroxysmal atrial fibrillation: Secondary | ICD-10-CM | POA: Diagnosis present

## 2011-04-07 DIAGNOSIS — I6529 Occlusion and stenosis of unspecified carotid artery: Secondary | ICD-10-CM | POA: Diagnosis not present

## 2011-04-07 DIAGNOSIS — I10 Essential (primary) hypertension: Secondary | ICD-10-CM | POA: Diagnosis present

## 2011-04-07 DIAGNOSIS — Z9849 Cataract extraction status, unspecified eye: Secondary | ICD-10-CM

## 2011-04-07 DIAGNOSIS — I4891 Unspecified atrial fibrillation: Secondary | ICD-10-CM | POA: Diagnosis not present

## 2011-04-07 DIAGNOSIS — Z79899 Other long term (current) drug therapy: Secondary | ICD-10-CM

## 2011-04-07 DIAGNOSIS — Z7901 Long term (current) use of anticoagulants: Secondary | ICD-10-CM

## 2011-04-07 DIAGNOSIS — R6884 Jaw pain: Secondary | ICD-10-CM | POA: Diagnosis not present

## 2011-04-07 DIAGNOSIS — R0789 Other chest pain: Secondary | ICD-10-CM | POA: Diagnosis not present

## 2011-04-07 DIAGNOSIS — F411 Generalized anxiety disorder: Secondary | ICD-10-CM | POA: Diagnosis present

## 2011-04-07 DIAGNOSIS — E785 Hyperlipidemia, unspecified: Secondary | ICD-10-CM | POA: Diagnosis not present

## 2011-04-07 DIAGNOSIS — R079 Chest pain, unspecified: Secondary | ICD-10-CM | POA: Diagnosis not present

## 2011-04-07 DIAGNOSIS — E876 Hypokalemia: Secondary | ICD-10-CM | POA: Diagnosis present

## 2011-04-07 DIAGNOSIS — K219 Gastro-esophageal reflux disease without esophagitis: Secondary | ICD-10-CM | POA: Diagnosis not present

## 2011-04-07 DIAGNOSIS — M810 Age-related osteoporosis without current pathological fracture: Secondary | ICD-10-CM | POA: Diagnosis not present

## 2011-04-07 DIAGNOSIS — I658 Occlusion and stenosis of other precerebral arteries: Secondary | ICD-10-CM | POA: Diagnosis not present

## 2011-04-07 DIAGNOSIS — I2510 Atherosclerotic heart disease of native coronary artery without angina pectoris: Secondary | ICD-10-CM | POA: Diagnosis not present

## 2011-04-07 LAB — DIFFERENTIAL
Basophils Relative: 1 % (ref 0–1)
Eosinophils Absolute: 0 10*3/uL (ref 0.0–0.7)
Lymphs Abs: 1.1 10*3/uL (ref 0.7–4.0)
Monocytes Relative: 9 % (ref 3–12)
Neutro Abs: 4.9 10*3/uL (ref 1.7–7.7)
Neutrophils Relative %: 74 % (ref 43–77)

## 2011-04-07 LAB — BASIC METABOLIC PANEL
BUN: 10 mg/dL (ref 6–23)
Chloride: 103 mEq/L (ref 96–112)
GFR calc Af Amer: >90 mL/min (ref >90)
GFR calc non Af Amer: >90 mL/min (ref >90)
Glucose, Bld: 124 mg/dL — ABNORMAL HIGH (ref 70–99)
Potassium: 3.2 mEq/L — ABNORMAL LOW (ref 3.5–5.1)
Sodium: 142 mEq/L (ref 135–145)

## 2011-04-07 LAB — CBC
Hemoglobin: 14.3 g/dL (ref 12.0–15.0)
MCH: 32.2 pg (ref 26.0–34.0)
Platelets: 179 10*3/uL (ref 150–400)
RBC: 4.44 MIL/uL (ref 3.87–5.11)

## 2011-04-07 LAB — PROTIME-INR: INR: 2.81 — ABNORMAL HIGH (ref 0.00–1.49)

## 2011-04-07 LAB — CARDIAC PANEL(CRET KIN+CKTOT+MB+TROPI)
CK, MB: 3.2 ng/mL (ref 0.3–4.0)
Total CK: 87 U/L (ref 7–177)

## 2011-04-07 MED ORDER — SODIUM CHLORIDE 0.9 % IJ SOLN: 3.0000 mL | INTRAMUSCULAR | Status: DC | PRN

## 2011-04-07 MED ORDER — ASPIRIN 325 MG PO TABS
325.0000 mg | ORAL_TABLET | Freq: Every day | ORAL | Status: DC
  Administered 2011-04-08: 325 mg via ORAL
  Filled 2011-04-07: qty 1

## 2011-04-07 MED ORDER — POTASSIUM CHLORIDE CRYS ER 20 MEQ PO TBCR
40.0000 meq | EXTENDED_RELEASE_TABLET | Freq: Once | ORAL | Status: AC
  Administered 2011-04-07: 40 meq via ORAL
  Filled 2011-04-07: qty 2

## 2011-04-07 MED ORDER — REGADENOSON 0.4 MG/5ML IV SOLN
0.4000 mg | Freq: Once | INTRAVENOUS | Status: AC
  Administered 2011-04-08: 0.4 mg via INTRAVENOUS
  Filled 2011-04-07: qty 5

## 2011-04-07 MED ORDER — OCUVITE-LUTEIN PO CAPS
1.0000 | ORAL_CAPSULE | Freq: Every day | ORAL | Status: DC
  Administered 2011-04-08: 1 via ORAL
  Filled 2011-04-07 (×2): qty 1

## 2011-04-07 MED ORDER — NITROGLYCERIN 0.4 MG SL SUBL: 0.4000 mg | SUBLINGUAL_TABLET | SUBLINGUAL | Status: DC | PRN

## 2011-04-07 MED ORDER — ESTRADIOL 0.1 MG/GM VA CREA
2.0000 g | TOPICAL_CREAM | VAGINAL | Status: DC
  Filled 2011-04-07: qty 42.5

## 2011-04-07 MED ORDER — METOPROLOL TARTRATE 25 MG PO TABS
25.0000 mg | ORAL_TABLET | Freq: Two times a day (BID) | ORAL | Status: DC
Start: 2011-04-07 — End: 2011-04-08
  Administered 2011-04-08: 25 mg via ORAL
  Filled 2011-04-07 (×4): qty 1

## 2011-04-07 MED ORDER — SODIUM CHLORIDE 0.9 % IV SOLN: 250.0000 mL | INTRAVENOUS | Status: DC | PRN

## 2011-04-07 MED ORDER — CALCIUM CARBONATE ANTACID 500 MG PO CHEW
2.0000 | CHEWABLE_TABLET | Freq: Every day | ORAL | Status: DC
  Administered 2011-04-07 – 2011-04-08 (×2): 400 mg via ORAL
  Filled 2011-04-07 (×2): qty 2

## 2011-04-07 MED ORDER — GI COCKTAIL ~~LOC~~
30.0000 mL | Freq: Once | ORAL | Status: AC
  Administered 2011-04-07: 30 mL via ORAL
  Filled 2011-04-07: qty 30

## 2011-04-07 MED ORDER — OMEGA-3-ACID ETHYL ESTERS 1 G PO CAPS
1.0000 | ORAL_CAPSULE | Freq: Two times a day (BID) | ORAL | Status: DC
  Administered 2011-04-07 – 2011-04-08 (×2): 1 g via ORAL
  Filled 2011-04-07 (×4): qty 1

## 2011-04-07 MED ORDER — HYDROCHLOROTHIAZIDE 12.5 MG PO CAPS
12.5000 mg | ORAL_CAPSULE | Freq: Every day | ORAL | Status: DC
Start: 2011-04-07 — End: 2011-04-08
  Administered 2011-04-07 – 2011-04-08 (×2): 12.5 mg via ORAL
  Filled 2011-04-07 (×2): qty 1

## 2011-04-07 MED ORDER — ACETAMINOPHEN 325 MG PO TABS: 650.0000 mg | ORAL_TABLET | ORAL | Status: DC | PRN

## 2011-04-07 MED ORDER — ASPIRIN 81 MG PO CHEW
324.0000 mg | CHEWABLE_TABLET | Freq: Once | ORAL | Status: AC
  Administered 2011-04-07: 324 mg via ORAL
  Filled 2011-04-07: qty 4

## 2011-04-07 MED ORDER — SODIUM CHLORIDE 0.9 % IJ SOLN
3.0000 mL | Freq: Two times a day (BID) | INTRAMUSCULAR | Status: DC
  Administered 2011-04-07 – 2011-04-08 (×2): 3 mL via INTRAVENOUS

## 2011-04-07 MED ORDER — ONDANSETRON HCL 4 MG/2ML IJ SOLN: 4.0000 mg | Freq: Four times a day (QID) | INTRAMUSCULAR | Status: DC | PRN

## 2011-04-07 MED ORDER — SIMVASTATIN 20 MG PO TABS
20.0000 mg | ORAL_TABLET | Freq: Every evening | ORAL | Status: DC
  Administered 2011-04-07: 20 mg via ORAL
  Filled 2011-04-07 (×2): qty 1

## 2011-04-07 MED ORDER — NITROGLYCERIN 0.4 MG SL SUBL
0.4000 mg | SUBLINGUAL_TABLET | SUBLINGUAL | Status: AC | PRN
  Administered 2011-04-07 (×3): 0.4 mg via SUBLINGUAL
  Filled 2011-04-07: qty 25

## 2011-04-07 NOTE — H&P (Signed)
History and Physical  Patient ID: [name] MRN: [id number], SOB: [birth year] 78 y.o. Date of Encounter: 04/07/2011, 2:43 PM  Primary Physician: Ginette Otto, MD, MD Primary Cardiologist: Willa Rough MD  Chief Complaint: Chest Pain  HPI: 78 y.o. female w/ PMHx significant for mild CAD by cath 2008 and normal stress test '10, HTN, HLD, PAF (on coumadin) and GERD who presented to [facility] on 04/07/2011 with complaints of chest pain.   She reports having chest pain on and off for the last 3 weeks as well as palpitations and increased blood pressure. She saw Norma Fredrickson in the office on January 21 with similar symptoms and was scheduled for a Lexiscan myoview stress test on Monday (04/10/11). This morning she was feeling well when she had sudden onset substernal chest tightness that radiated into her throat and teeth. No associated symptoms. The pain lasted about 45secs and resolved without intervention. She checked her BP and it was 180/?[name] She felt she couldn't wait until Monday to be evaluated and presented to the ED. She had two episodes of elevated heart rates in the last week that felt similar to her atrial fibrillation, but didn't have chest pain with these episodes. She has been under increased stress as her husband had a stroke about 3 weeks ago and she has been his caretaker. Last cardiac cath in 2008 revealed nonobstructive 40% left anterior descending stenosis and normal left ventricular function (65%). Adenosine myoview stress test in 2010 was without significant ischemia.  In the ED, EKG revealed NSR with minimal ST depression inferolaterally, appears grossly unchanged from prior. CXR was without acute cardiopulmonary abnormalities. Initial cardiac enzyme was negative. Labs significant for K+ 3.2, INR 2.8, and glucose 124. Received ASA 324mg , SL NTG x3, and a GI cocktail in the ED. She has had intermittent chest pain while in the ED that resolves without  intervention. She isn't sure if the NTG or GI cocktail helped the chest pain.   Past Medical History  Diagnosis Date  . Spinal stenosis   . Anxiety   . GERD (gastroesophageal reflux disease)   . Osteoporosis   . Paroxysmal atrial fibrillation     Coumadin therapy  . Hypertension   . Hyperlipidemia   . CAD (coronary artery disease) 2008/2010    Catheterization 2008, 40% LAD after first diagonal  /  nuclear January, 2010 normal  . Carotid artery disease     Doppler, July, 2011, 0-39% bilateral  . Endometrial polyp   . Atrophic vaginitis   . Vitamin D deficiency   . Stress      Surgical History:  Past Surgical History  Procedure Date  . Laminectomy March 2009    L4 through L5  . Cataract surgery 2009  . Tonsillectomy   . Hysteroscopy 02/07/2005    HYST W D&C, DX: PMB W SMALL ENDO POLYP-SURGEON [name]  . Breast surgery     BENIGN BREAST LUMP  . Excision morton's neuroma 2000  . Hip surgery 2005     Home Meds: Medication Sig  calcium carbonate (CALCIUM ANTACID) 500 MG chewable tablet Chew 2 tablets by mouth daily.    Cyanocobalamin (VITAMIN B-12 PO) Take 1 tablet by mouth daily.   Ergocalciferol (VITAMIN D2 PO) Take 125 mg by mouth every 14 (fourteen) days.   estradiol (ESTRACE) 0.1 MG/GM vaginal cream Place 2 g vaginally 3 (three) times a week. As needed for vaginal dryness  Fish Oil OIL Take 1 tablet by mouth 2 (two) times  daily.   hydrochlorothiazide (MICROZIDE) 12.5 MG capsule Take 12.5 mg by mouth daily.  metoprolol tartrate (LOPRESSOR) 25 MG tablet Take 25 mg by mouth 2 (two) times daily.  Multiple Vitamins-Minerals (OCUVITE PRESERVISION) TABS Take 1 each by mouth daily.    simvastatin (ZOCOR) 20 MG tablet Take 20 mg by mouth every evening.  warfarin (COUMADIN) 5 MG tablet Take 5 mg by mouth.   Allergies: No Known Allergies  Social History  . Marital Status: Married   Occupational History  . Retired    Social History Main Topics  . Smoking status: Never  Smoker   . Smokeless tobacco: Never Used  . Alcohol Use: No  . Drug Use: No  . Sexually Active: No   Family History  Problem Relation Age of Onset  . Stroke Other     or CVA  . Diabetes Other   . Transient ischemic attack Mother   . Cancer Father     PROSTATE    Review of Systems: General: negative for chills, fever, night sweats or weight changes.  Cardiovascular: (+) chest pain, palpitations; negative fordyspnea on exertion, edema, orthopnea, paroxysmal nocturnal dyspnea or shortness of breath Dermatological: negative for rash Respiratory: negative for cough or wheezing Urologic: negative for hematuria Abdominal: negative for nausea, vomiting, diarrhea, bright red blood per rectum, melena, or hematemesis Neurologic: negative for visual changes, syncope, or dizziness All other systems reviewed and are otherwise negative except as noted above.  Labs:   Lab Results  Component Value Date   WBC 6.6 04/07/2011   HGB 14.3 04/07/2011   HCT 40.8 04/07/2011   MCV 91.9 04/07/2011   PLT 179 04/07/2011     Lab 04/07/11 1348  NA 142  K 3.2*  CL 103  CO2 28  BUN 10  CREATININE 0.54  CALCIUM 9.8  GLUCOSE 124*    Basename 04/07/11 1348  CKTOTAL 87  CKMB 3.2  TROPONINI <0.30   Lab Results  Component Value Date   CHOL 145 06/14/2010   HDL 60.30 06/14/2010   LDLCALC 74 06/14/2010   TRIG 52.0 06/14/2010   Radiology/Studies:   04/07/11 @ 1415 - CXR Fndings: The lungs are clear and slightly hyperaerated. Mdiastinal contours appear stable. The heart is within normal lmits in size. No bony abnormality is seen. IMPRESSION: No active lung disease. Slight hyperaeration.   EKG: 04/07/11 @ 1304 - NSR 65bpm, with minimal ST depression inferolaterally, appears grossly unchanged from prior  Physical Exam: Blood pressure 120/55, pulse 60, temperature 98.3 F (36.8 C), temperature source Oral, resp. rate 14, height 5\' 2"  (1.575 m), weight 98 lb (44.453 kg), SpO2 100.00%. General: Well  developed, well nourished, thin elderly white female in no acute distress. Head: Normocephalic, atraumatic, sclera non-icteric, nares are without discharge Neck: Supple. Negative for carotid bruits. JVD not elevated. Lungs: Clear bilaterally to auscultation without wheezes, rales, or rhonchi. Breathing is unlabored. Heart: RRR with S1 S2. No murmurs, rubs, or gallops appreciated. Abdomen: Soft, non-tender, non-distended with normoactive bowel sounds. No rebound/guarding. No obvious abdominal masses. Msk:  Strength and tone appear normal for age. Extremities: No edema. No clubbing or cyanosis. Distal pedal pulses are 2+ and equal bilaterally. Neuro: Alert and oriented X 3. Moves all extremities spontaneously. Psych:  Responds to questions appropriately with a normal affect.   ASSESSMENT AND PLAN:  78 y.o. female w/ PMHx significant for mild CAD by cath 2008 and normal stress test '10, HTN, HLD, PAF (on coumadin) and GERD who presented to [facility]  on 04/07/2011 with complaints of chest pain.   1. Chest pain: Unclear if cardiac in nature at this time. Will admit to telemetry with plans for [facility] tomorrow. Cont BB and statin. Add 325mg  ASA. Hold Coumadin until Steffanie Dunn is completed in case she requires cardiac catherization. Resume if normal. Cycle cardiac enzymes. Check TSH.  2. CAD: Mild by cath '08. Meds as above  3. HTN: Initial BP high but stable now. Cont home meds  4. PAF: In sinus rhythm. INR therapeutic. Hold coumadin as above.  5. Hypokalemia: Supplement and monitor   Signed, HOPE, JESSICA PA-C 04/07/2011, 2:43 PM  Patient seen and examined. I agree with the assessment and plan as detailed above. See also my additional thoughts below.   The patient has had increased stress. Her husband recently had a stroke but is doing well. She's noted palpitations and she may well be having some paroxysmal atrial fibrillation. She is anticoagulated well. She's had  previous episodes of the tightness in her upper chest radiating into her neck. This lasts approximately 45 seconds. She's had several episodes. So far there is no proof of ischemia. We will plan to monitor her and follow her enzymes. If the enzymes are normal we will proceed with a nuclear stress study tomorrow. Her rhythm will also be monitored.  Willa Rough, MD, [facility] 04/07/2011 4:54 PM

## 2011-04-07 NOTE — ED Notes (Signed)
Md aware of results from ntg and gi cocktail.

## 2011-04-07 NOTE — ED Notes (Signed)
Rn to call back for report. Unit has had several admits in past several minutes.

## 2011-04-07 NOTE — ED Notes (Signed)
No changes noted. Pt denies changes. States chest pain continues off and on at about 3/10.

## 2011-04-07 NOTE — ED Provider Notes (Signed)
History     CSN: [id number]  Arrival date & time 04/07/11  1254   First MD Initiated Contact with Patient 04/07/11 1316      Chief Complaint  Patient presents with  . Chest Pain    (Consider location/radiation/quality/duration/timing/severity/associated sxs/prior treatment) HPI Comments: Patient has been having intermittent chest pain for the past 2 weeks that she calls a tightness that comes and goes. It lasts for minutes at a time and comes on without warning. It is not associated with exertion or eating. It does not radiate. She sometimes feels a tightness in her throat as well. She denies any shortness of breath, nausea, diaphoresis. 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 She saw [facility] on January 21 with similar symptoms and is scheduled for a stress test 3 days from now. She feels like she can't wait. Her last stress test was in 2010 and was normal. A catheterization in 2008 that showed mild CAD  The history is provided by the patient.    Past Medical History  Diagnosis Date  . Spinal stenosis   . Anxiety   . GERD (gastroesophageal reflux disease)   . Osteoporosis   . Paroxysmal atrial fibrillation     Coumadin therapy  . Hypertension   . Hyperlipidemia   . CAD (coronary artery disease) 2008/2010    Catheterization 2008, 40% LAD after first diagonal  /  nuclear January, 2010 normal  . Carotid artery disease     Doppler, July, 2011, 0-39% bilateral  . Endometrial polyp   . Atrophic vaginitis   . Vitamin D deficiency   . Stress     Past Surgical History  Procedure Date  . Laminectomy March 2009    L4 through L5  . Cataract surgery 2009  . Tonsillectomy   . Hysteroscopy 02/07/2005    HYST W D&C, DX: PMB W SMALL ENDO POLYP-SURGEON [name]  . Breast surgery     BENIGN BREAST LUMP  . Excision morton's neuroma 2000  . Hip surgery 2005    Family History    Problem Relation Age of Onset  . Stroke Other     or CVA  . Diabetes Other   . Transient ischemic attack Mother   . Cancer Father     PROSTATE    History  Substance Use Topics  . Smoking status: Never Smoker   . Smokeless tobacco: Never Used  . Alcohol Use: No    OB History    Grav Para Term Preterm Abortions TAB SAB Ect Mult Living                  Review of Systems  Constitutional: Positive for fatigue. Negative for fever, activity change and appetite change.  HENT: Negative for congestion.   Respiratory: Positive for chest tightness. Negative for cough and shortness of breath.   Cardiovascular: Positive for chest pain.  Gastrointestinal: Negative for nausea, vomiting and abdominal pain.  Genitourinary: Negative for dysuria and hematuria.  Musculoskeletal: Negative for back pain.  Skin: Negative for rash.  Neurological: Negative for weakness, light-headedness and headaches.    Allergies  Review of patient's allergies indicates no known allergies.  Home Medications   Current Outpatient Rx  Name Route Sig Dispense Refill  . CALCIUM CARBONATE ANTACID 500 MG PO CHEW Oral Chew 2 tablets by mouth daily.      [name] VITAMIN B-12 PO Oral Take 1 tablet  by mouth daily.     [name] VITAMIN D2 PO Oral Take 125 mg by mouth every 14 (fourteen) days.     [name] ESTRADIOL 0.1 MG/GM VA CREA Vaginal Place 2 g vaginally 3 (three) times a week. As needed for vaginal dryness    . FISH OIL OIL Oral Take 1 tablet by mouth 2 (two) times daily.     [name] HYDROCHLOROTHIAZIDE 12.5 MG PO CAPS Oral Take 12.5 mg by mouth daily.    [name] METOPROLOL TARTRATE 25 MG PO TABS Oral Take 25 mg by mouth 2 (two) times daily.    [place]olina Primer PRESERVISION PO TABS Oral Take 1 each by mouth daily.      [name] SIMVASTATIN 20 MG PO TABS Oral Take 20 mg by mouth every evening.    . WARFARIN SODIUM 5 MG PO TABS Oral Take 5 mg by mouth.      BP 121/70  Pulse 45  Temp(Src) 98.3 F (36.8 C) (Oral)  Resp 15  Ht 5\' 2"  (1.575 m)  Wt 98 lb  (44.453 kg)  BMI 17.92 kg/m2  SpO2 98%  Physical Exam  Constitutional: She is oriented to person, place, and time. She appears well-developed and well-nourished. No distress.  HENT:  Head: Normocephalic and atraumatic.  Mouth/Throat: Oropharynx is clear and moist. No oropharyngeal exudate.  Eyes: Conjunctivae are normal. Pupils are equal, round, and reactive to light.  Neck: Normal range of motion. Neck supple.  Cardiovascular: Normal rate, regular rhythm and normal heart sounds.   Pulmonary/Chest: Effort normal and breath sounds normal. No respiratory distress.  Abdominal: Soft. There is no tenderness. There is no rebound and no guarding.  Musculoskeletal: Normal range of motion. She exhibits no edema and no tenderness.  Neurological: She is alert and oriented to person, place, and time. No cranial nerve deficit.  Skin: Skin is warm.    ED Course  Procedures (including critical care time)  Labs Reviewed  BASIC METABOLIC PANEL - Abnormal; Notable for the following:    Potassium 3.2 (*)    Glucose, Bld 124 (*)    All other components within normal limits  PROTIME-INR - Abnormal; Notable for the following:    Prothrombin Time 30.0 (*)    INR 2.81 (*)    All other components within normal limits  CBC  DIFFERENTIAL  CARDIAC PANEL(CRET KIN+CKTOT+MB+TROPI)   Dg Chest 2 View  04/07/2011  *RADIOLOGY REPORT*  Clinical Data: Mid chest pain radiating to the jaw, hypertension  CHEST - 2 VIEW  Comparison: Chest x-ray of 04/18/2008  Findings: The lungs are clear and slightly hyperaerated. Mediastinal contours appear stable.  The heart is within normal limits in size.  No bony abnormality is seen.  IMPRESSION: No active lung disease.  Slight hyperaeration.  Original Report Authenticated By: Juline Patch, M.D.     1. Chest pain       MDM  Intermittent atypical chest pain and tightness for the past 2 weeks. Mild CAD remotely, stress test scheduled in 3 days. Pain is atypical for  ACS.   Cardiology planning to admit patient today for more expedient stress test.   Date: 04/07/2011  Rate: 65  Rhythm: normal sinus rhythm  QRS Axis: normal  Intervals: normal  ST/T Wave abnormalities: nonspecific ST/T changes  Conduction Disutrbances:none  Narrative Interpretation: Septal Q waves unchanged, inferior lateral ST depression  Old EKG Reviewed: unchanged         Glynn Octave, MD 04/07/11 1643

## 2011-04-07 NOTE — ED Notes (Signed)
Pt states pain continues at 3/10. Back from xray. PA at bedside from cardiology.

## 2011-04-07 NOTE — ED Notes (Signed)
Pt presents with on and off chest pain x 1 week characterized as tightness. Pt also claimed that she is in a lot stress and her BP has been elevated

## 2011-04-08 ENCOUNTER — Inpatient Hospital Stay (HOSPITAL_COMMUNITY): Payer: Medicare Other

## 2011-04-08 DIAGNOSIS — R079 Chest pain, unspecified: Secondary | ICD-10-CM | POA: Diagnosis not present

## 2011-04-08 DIAGNOSIS — E785 Hyperlipidemia, unspecified: Secondary | ICD-10-CM | POA: Diagnosis not present

## 2011-04-08 DIAGNOSIS — I4891 Unspecified atrial fibrillation: Secondary | ICD-10-CM | POA: Diagnosis not present

## 2011-04-08 DIAGNOSIS — K219 Gastro-esophageal reflux disease without esophagitis: Secondary | ICD-10-CM | POA: Diagnosis not present

## 2011-04-08 DIAGNOSIS — I10 Essential (primary) hypertension: Secondary | ICD-10-CM | POA: Diagnosis not present

## 2011-04-08 DIAGNOSIS — I2510 Atherosclerotic heart disease of native coronary artery without angina pectoris: Secondary | ICD-10-CM | POA: Diagnosis not present

## 2011-04-08 DIAGNOSIS — R0789 Other chest pain: Secondary | ICD-10-CM | POA: Diagnosis not present

## 2011-04-08 LAB — TSH: TSH: 1.571 u[IU]/mL (ref 0.350–4.500)

## 2011-04-08 LAB — BASIC METABOLIC PANEL
Chloride: 104 mEq/L (ref 96–112)
GFR calc Af Amer: >90 mL/min (ref >90)
Potassium: 4 mEq/L (ref 3.5–5.1)
Sodium: 142 mEq/L (ref 135–145)

## 2011-04-08 LAB — CARDIAC PANEL(CRET KIN+CKTOT+MB+TROPI)
CK, MB: 2.5 ng/mL (ref 0.3–4.0)
Relative Index: INVALID (ref 0.0–2.5)
Total CK: 67 U/L (ref 7–177)

## 2011-04-08 LAB — CBC
Platelets: 189 10*3/uL (ref 150–400)
RDW: 12.5 % (ref 11.5–15.5)
WBC: 7 10*3/uL (ref 4.0–10.5)

## 2011-04-08 LAB — PROTIME-INR
INR: 2.66 — ABNORMAL HIGH (ref 0.00–1.49)
Prothrombin Time: 28.8 seconds — ABNORMAL HIGH (ref 11.6–15.2)

## 2011-04-08 MED ORDER — TECHNETIUM TC 99M TETROFOSMIN IV KIT
10.0000 | PACK | Freq: Once | INTRAVENOUS | Status: AC | PRN
  Administered 2011-04-08: 10 via INTRAVENOUS

## 2011-04-08 MED ORDER — TECHNETIUM TC 99M TETROFOSMIN IV KIT
30.0000 | PACK | Freq: Once | INTRAVENOUS | Status: AC | PRN
  Administered 2011-04-08: 30 via INTRAVENOUS

## 2011-04-08 NOTE — Discharge Summary (Signed)
Patient ID: [name],  MRN: [id number], DOB/AGE: 1933-10-09 78 y.o.  Admit date: 04/07/2011 Discharge date: 04/08/2011  Primary Care Provider: Alexis Goodell Primary Cardiologist: Lovena Neighbours  Discharge Diagnoses Principal Problem:  *Chest pain Active Problems:  Warfarin anticoagulation  GERD (gastroesophageal reflux disease)  Paroxysmal atrial fibrillation  Hypertension  Hyperlipidemia  CAD (coronary artery disease)   Allergies No Known Allergies  Procedures  04/08/2011 Lexiscan Myoview IMPRESSION:  1.  No reversible ischemia or infarction. 2.  Normal wall motion. 3.  Left ventricular ejection fraction equal greater than 70%  History of Present Illness  78 y/o female with the above problem list.  She was recently seen in clinic on 1/21 secondary to intermittent chest discomfort and palpitations with fluctuating BP.  Pt was set up for an outpt Myoview to take place 1/28 however, on the morning of admission, she had recurrent, fleeting, sscp/tightness radiating to her teeth, which resolved spontaneously.  She presented to the ED where ECG was non-acute and CE were negative.  She cont to have intermittent, fleeting pain in the ED and was admitted for further evaluation.  Hospital Course   Pt r/o for MI.  She had no further chest pain.  Lexiscan myoview was performed as an inpatient this am and showed no evidence of ischemia or infarct with normal LV function.  As a result, she will be d/c today in good condition.  Discharge Vitals:  Blood pressure 138/56, pulse 74, temperature 97.9 F (36.6 C), temperature source Oral, resp. rate 18, height 5\' 2"  (1.575 m), weight 98 lb (44.453 kg), SpO2 99.00%.    Labs: CBC:  Basename 04/08/11 0720 04/07/11 1348  WBC 7.0 6.6  NEUTROABS -- 4.9  HGB 14.8 14.3  HCT 44.1 40.8  MCV 94.6 91.9  PLT 189 179   Basic Metabolic Panel:  Basename 04/08/11 0720 04/07/11 1348  NA 142 142  K 4.0 3.2*  CL 104 103  CO2 28 28  GLUCOSE 105*  124*  BUN 17 10  CREATININE 0.65 0.54  CALCIUM 9.6 9.8  MG -- --  PHOS -- --   Cardiac Enzymes:  Basename 04/07/11 2307 04/07/11 1841 04/07/11 1348  CKTOTAL 67 81 87  CKMB 2.5 2.9 3.2  CKMBINDEX -- -- --  TROPONINI <0.30 <0.30 <0.30   Thyroid Function Tests:  Basename 04/07/11 1841  TSH 1.571  T4TOTAL --  T3FREE --  THYROIDAB --    Disposition:  Follow-up Information    Follow up with Ginette Otto, MD. (As needed)       Follow up with Willa Rough, MD. (as scheduled)    Contact information:   [address] [place] [address] [phone]          Discharge Medications:  Medication List  As of 04/08/2011 12:52 PM   TAKE these medications         CALCIUM ANTACID 500 MG chewable tablet   Generic drug: calcium carbonate   Chew 2 tablets by mouth daily.      estradiol 0.1 MG/GM vaginal cream   Commonly known as: ESTRACE   Place 2 g vaginally 3 (three) times a week. As needed for vaginal dryness      Fish Oil Oil   Take 1 tablet by mouth 2 (two) times daily.      hydrochlorothiazide 12.5 MG capsule   Commonly known as: MICROZIDE   Take 12.5 mg by mouth daily.      metoprolol tartrate  25 MG tablet   Commonly known as: LOPRESSOR   Take 25 mg by mouth 2 (two) times daily.      Ocuvite PreserVision Tabs   Take 1 each by mouth daily.      simvastatin 20 MG tablet   Commonly known as: ZOCOR   Take 20 mg by mouth every evening.      VITAMIN B-12 PO   Take 1 tablet by mouth daily.      VITAMIN D2 PO   Take 125 mg by mouth every 14 (fourteen) days.      warfarin 5 MG tablet   Commonly known as: COUMADIN   Take 5 mg by mouth.            Outstanding Labs/Studies  None  Duration of Discharge Encounter: Greater than 30 minutes including physician time.  Signed, Nicolasa Ducking NP 04/08/2011, 12:52 PM

## 2011-04-08 NOTE — Progress Notes (Signed)
Patient Name: [name] Date of Encounter: 04/08/2011     Principal Problem:  *Chest pain Active Problems:  Warfarin anticoagulation  GERD (gastroesophageal reflux disease)  Paroxysmal atrial fibrillation  Hypertension  Hyperlipidemia  CAD (coronary artery disease)    SUBJECTIVE:  No chest pain/sob overnight.  For myoview today (seen in nuc med)   CURRENT MEDS    . aspirin  324 mg Oral Once  . aspirin  325 mg Oral Daily  . calcium carbonate  2 tablet Oral Daily  . estradiol  2 g Vaginal 3 times weekly  . gi cocktail  30 mL Oral Once  . hydrochlorothiazide  12.5 mg Oral Daily  . metoprolol tartrate  25 mg Oral BID  . multivitamin-lutein  1 capsule Oral Daily  . omega-3 acid ethyl esters  1 capsule Oral BID  . potassium chloride  40 mEq Oral Once  . regadenoson  0.4 mg Intravenous Once  . simvastatin  20 mg Oral QPM  . sodium chloride  3 mL Intravenous Q12H    OBJECTIVE  Filed Vitals:   04/07/11 1747 04/07/11 2100 04/08/11 0634 04/08/11 0907  BP: 128/66 114/55 132/67 138/64  Pulse: 52 57 54 60  Temp: 97.7 F (36.5 C) 97.9 F (36.6 C) 97.9 F (36.6 C)   TempSrc: Oral Oral Oral   Resp: 16 18 18 18   Height:      Weight:      SpO2: 99% 99% 99%     PHYSICAL EXAM  General: Well developed, well nourished, in no acute distress. Head: Normocephalic, atraumatic, sclera non-icteric, no xanthomas, nares are without discharge.  Neck: Supple without bruits or JVD. Lungs:  Resp regular and unlabored, CTA. Heart: RRR no s3, s4, or murmurs. Abdomen: Soft, non-tender, non-distended, BS + x 4.  Msk:  Strength and tone appears normal for age. Extremities: No clubbing, cyanosis or edema. DP/PT/Radials 2+ and equal bilaterally. Neuro: Alert and oriented X 3. Moves all extremities spontaneously. Psych: Normal affect.  LABS:  CBC:  Basename 04/08/11 0720 04/07/11 1348  WBC 7.0 6.6  NEUTROABS -- 4.9  HGB 14.8 14.3  HCT 44.1 40.8  MCV 94.6 91.9  PLT 189  179   Basic Metabolic Panel:  Basename 04/08/11 0720 04/07/11 1348  NA 142 142  K 4.0 3.2*  CL 104 103  CO2 28 28  GLUCOSE 105* 124*  BUN 17 10  CREATININE 0.65 0.54  CALCIUM 9.6 9.8  MG -- --  PHOS -- --  Cardiac Enzymes:  Basename 04/07/11 2307 04/07/11 1841 04/07/11 1348  CKTOTAL 67 81 87  CKMB 2.5 2.9 3.2  CKMBINDEX -- -- --  TROPONINI <0.30 <0.30 <0.30   Thyroid Function Tests:  Basename 04/07/11 1841  TSH 1.571  T4TOTAL --  T3FREE --  THYROIDAB --     TELE - pt seen in nuc med  Radiology/Studies:  Dg Chest 2 View  04/07/2011  *RADIOLOGY REPORT*  Clinical Data: Mid chest pain radiating to the jaw, hypertension  CHEST - 2 VIEW  Comparison: Chest x-ray of 04/18/2008  Findings: The lungs are clear and slightly hyperaerated. Mediastinal contours appear stable.  The heart is within normal limits in size.  No bony abnormality is seen.  IMPRESSION: No active lung disease.  Slight hyperaeration.  Original Report Authenticated By: Juline Patch, M.D.   ASSESSMENT AND PLAN:  1.  Chest pain:  Atypical.  For myoview today.  No recurrence overnight.  CE negative.  Plan d/c today if MV  negative.  2.  HTN:  Stable.  3.  HL:  Cont statin.  4.  PAF:  In sinus.  INR Rx.  Signed, Nicolasa Ducking NP

## 2011-04-08 NOTE — Progress Notes (Signed)
Pt. Discharged 04/08/2011  2:10 PM Discharge instructions reviewed with patient/family. Patient/family verbalized understanding. All Rx's given. Questions answered as needed. Pt. Discharged to home with family/self. Taken off unit via W/C. Lunette Tapp

## 2011-04-08 NOTE — Progress Notes (Signed)
Patient ID: [name], female   DOB: 10/05/1933, 78 y.o.   MRN: [id number]   Patient Name: [name] Date of Encounter: 04/08/2011     Principal Problem:  *Chest pain Active Problems:  Warfarin anticoagulation  GERD (gastroesophageal reflux disease)  Paroxysmal atrial fibrillation  Hypertension  Hyperlipidemia  CAD (coronary artery disease)    SUBJECTIVE:  No chest pain/sob overnight.  For myoview today (seen in nuc med)   CURRENT MEDS    . aspirin  324 mg Oral Once  . aspirin  325 mg Oral Daily  . calcium carbonate  2 tablet Oral Daily  . estradiol  2 g Vaginal 3 times weekly  . gi cocktail  30 mL Oral Once  . hydrochlorothiazide  12.5 mg Oral Daily  . metoprolol tartrate  25 mg Oral BID  . multivitamin-lutein  1 capsule Oral Daily  . omega-3 acid ethyl esters  1 capsule Oral BID  . potassium chloride  40 mEq Oral Once  . regadenoson  0.4 mg Intravenous Once  . simvastatin  20 mg Oral QPM  . sodium chloride  3 mL Intravenous Q12H    OBJECTIVE  Filed Vitals:   04/08/11 0924 04/08/11 0926 04/08/11 0928 04/08/11 1041  BP: 151/49 143/51 141/48 138/56  Pulse: 87 82 77 74  Temp:      TempSrc:      Resp:      Height:      Weight:      SpO2:        PHYSICAL EXAM  General: Well developed, well nourished, in no acute distress. Head: Normocephalic, atraumatic, sclera non-icteric, no xanthomas, nares are without discharge.  Neck: Supple without bruits or JVD. Lungs:  Resp regular and unlabored, CTA. Heart: RRR no s3, s4, or murmurs. Abdomen: Soft, non-tender, non-distended, BS + x 4.  Msk:  Strength and tone appears normal for age. Extremities: No clubbing, cyanosis or edema. DP/PT/Radials 2+ and equal bilaterally. Neuro: Alert and oriented X 3. Moves all extremities spontaneously. Psych: Normal affect.  LABS:  CBC:  Basename 04/08/11 0720 04/07/11 1348  WBC 7.0 6.6  NEUTROABS -- 4.9  HGB 14.8 14.3  HCT 44.1 40.8  MCV 94.6 91.9  PLT 189  179   Basic Metabolic Panel:  Basename 04/08/11 0720 04/07/11 1348  NA 142 142  K 4.0 3.2*  CL 104 103  CO2 28 28  GLUCOSE 105* 124*  BUN 17 10  CREATININE 0.65 0.54  CALCIUM 9.6 9.8  MG -- --  PHOS -- --  Cardiac Enzymes:  Basename 04/07/11 2307 04/07/11 1841 04/07/11 1348  CKTOTAL 67 81 87  CKMB 2.5 2.9 3.2  CKMBINDEX -- -- --  TROPONINI <0.30 <0.30 <0.30   Thyroid Function Tests:  Basename 04/07/11 1841  TSH 1.571  T4TOTAL --  T3FREE --  THYROIDAB --     TELE - pt seen in nuc med  Radiology/Studies:  Dg Chest 2 View  04/07/2011  *RADIOLOGY REPORT*  Clinical Data: Mid chest pain radiating to the jaw, hypertension  CHEST - 2 VIEW  Comparison: Chest x-ray of 04/18/2008  Findings: The lungs are clear and slightly hyperaerated. Mediastinal contours appear stable.  The heart is within normal limits in size.  No bony abnormality is seen.  IMPRESSION: No active lung disease.  Slight hyperaeration.  Original Report Authenticated By: Juline Patch, M.D.   ASSESSMENT AND PLAN:  Agree with assessment by PA Ward Givens    1.  Chest pain:  Atypical.  For myoview today.  No recurrence overnight.  CE negative.  Plan d/c today if MV negative.  2.  HTN:  Stable.  3.  HL:  Cont statin.  4.  PAF:  In sinus.  INR Rx.  Signed, Charlton Haws NP

## 2011-04-10 ENCOUNTER — Other Ambulatory Visit (HOSPITAL_COMMUNITY): Payer: Medicare Other | Admitting: Radiology

## 2011-04-10 NOTE — Progress Notes (Signed)
UR Completed. POST DISCHARGE [name] [phone]

## 2011-04-14 DIAGNOSIS — D696 Thrombocytopenia, unspecified: Secondary | ICD-10-CM

## 2011-04-14 DIAGNOSIS — IMO0001 Reserved for inherently not codable concepts without codable children: Secondary | ICD-10-CM

## 2011-04-14 HISTORY — DX: Reserved for inherently not codable concepts without codable children: IMO0001

## 2011-04-14 HISTORY — DX: Thrombocytopenia, unspecified: D69.6

## 2011-04-24 ENCOUNTER — Encounter: Payer: Self-pay | Admitting: Nurse Practitioner

## 2011-04-24 ENCOUNTER — Ambulatory Visit (INDEPENDENT_AMBULATORY_CARE_PROVIDER_SITE_OTHER): Payer: Medicare Other | Admitting: Nurse Practitioner

## 2011-04-24 VITALS — BP 132/70 | HR 68 | Ht 62.0 in | Wt 100.4 lb

## 2011-04-24 DIAGNOSIS — R002 Palpitations: Secondary | ICD-10-CM | POA: Diagnosis not present

## 2011-04-24 DIAGNOSIS — R079 Chest pain, unspecified: Secondary | ICD-10-CM | POA: Diagnosis not present

## 2011-04-24 DIAGNOSIS — I10 Essential (primary) hypertension: Secondary | ICD-10-CM

## 2011-04-24 DIAGNOSIS — E785 Hyperlipidemia, unspecified: Secondary | ICD-10-CM | POA: Diagnosis not present

## 2011-04-24 NOTE — Patient Instructions (Signed)
Stay on your current medicines.  May take extra Metoprolol as needed.  We will see you in 6 months.   Call the [facility] office at [phone] if you have any questions, problems or concerns.

## 2011-04-24 NOTE — Assessment & Plan Note (Signed)
Blood pressure looks good. Will continue with her current regimen. 

## 2011-04-24 NOTE — Progress Notes (Signed)
[name] Date of Birth: June 29, 1933 Medical Record #[id number]  History of Present Illness: Ms. Coyt is seen back today for a 3 week check. She is seen for Dr. Myrtis Ser. She has had some palpitations and we started her on some Metoprolol. She had had more stress in caring for her husband who has had a recent stroke. Also with some atypical chest pain. Her stress test has been updated and looks good. No ischemia. Normal wall motion with an EF greater than 70%. Blood pressure was not at goal and we started low dose HCTZ on her last visit.  She comes in today. She is doing much better. So much better, that she stopped the HCTZ and cut her metoprolol back to her old dose. Her husband has improved significantly. She is under less stress. No more chest pain. Blood pressures are great. No palpitations. She is quite happy about how she feels.   Current Outpatient Prescriptions on File Prior to Visit  Medication Sig Dispense Refill  . calcium carbonate (CALCIUM ANTACID) 500 MG chewable tablet Chew 2 tablets by mouth daily.        . Cyanocobalamin (VITAMIN B-12 PO) Take 1 tablet by mouth every 14 (fourteen) days.       . Ergocalciferol (VITAMIN D2 PO) Take 125 mg by mouth every 14 (fourteen) days.       [name] estradiol (ESTRACE) 0.1 MG/GM vaginal cream Place 2 g vaginally 3 (three) times a week. As needed for vaginal dryness      . Fish Oil OIL Take 1 tablet by mouth 2 (two) times daily.       . metoprolol tartrate (LOPRESSOR) 25 MG tablet Take 12.5 mg by mouth 2 (two) times daily.       . Multiple Vitamins-Minerals (OCUVITE PRESERVISION) TABS Take 1 each by mouth daily.        . simvastatin (ZOCOR) 20 MG tablet Take 20 mg by mouth every evening.      . warfarin (COUMADIN) 5 MG tablet Take 5 mg by mouth.      . DISCONTD: estradiol (ESTRACE) 1 MG tablet Take 1 mg by mouth as needed.         No Known Allergies  Past Medical History  Diagnosis Date  . Spinal stenosis   . Anxiety   . GERD  (gastroesophageal reflux disease)   . Osteoporosis   . Paroxysmal atrial fibrillation     Coumadin therapy  . Hypertension   . Hyperlipidemia   . CAD (coronary artery disease) 2008/2010    Catheterization 2008, 40% LAD after first diagonal  /  nuclear January, 2010 normal  . Carotid artery disease     Doppler, July, 2011, 0-39% bilateral  . Endometrial polyp   . Atrophic vaginitis   . Vitamin d deficiency   . Stress   . Normal nuclear stress test Feb 2013    No ischemia. Normal wall motion. EF greater than 70%    Past Surgical History  Procedure Date  . Laminectomy March 2009    L4 through L5  . Cataract surgery 2009  . Tonsillectomy   . Hysteroscopy 02/07/2005    HYST W D&C, DX: PMB W SMALL ENDO POLYP-SURGEON [name]  . Breast surgery     BENIGN BREAST LUMP  . Excision morton's neuroma 2000  . Hip surgery 2005    History  Smoking status  . Never Smoker   Smokeless tobacco  . Never Used    History  Alcohol Use  No    Family History  Problem Relation Age of Onset  . Stroke Other     or CVA  . Diabetes Other   . Transient ischemic attack Mother   . Cancer Father     PROSTATE    Review of Systems: The review of systems is per the HPI.  All other systems were reviewed and are negative.  Physical Exam: BP 132/70  Pulse 68  Ht 5\' 2"  (1.575 m)  Wt 100 lb 6.4 oz (45.541 kg)  BMI 18.36 kg/m2 Patient is very pleasant and in no acute distress. Skin is warm and dry. Color is normal.  HEENT is unremarkable. Normocephalic/atraumatic. PERRL. Sclera are nonicteric. Neck is supple. No masses. No JVD. Lungs are clear. Cardiac exam shows a regular rate and rhythm. Abdomen is soft. Extremities are without edema. Gait and ROM are intact. No gross neurologic deficits noted.  LABORATORY DATA:   Assessment / Plan:

## 2011-04-24 NOTE — Assessment & Plan Note (Signed)
Last lipids that I see were from last April. She is due for her physical with Dr. Pete Glatter in 2 weeks. Continue statin.

## 2011-04-24 NOTE — Assessment & Plan Note (Signed)
Now resolved. She has cut her metoprolol back.

## 2011-04-24 NOTE — Assessment & Plan Note (Signed)
Now resolved. Stress test was normal.

## 2011-05-01 DIAGNOSIS — G479 Sleep disorder, unspecified: Secondary | ICD-10-CM | POA: Diagnosis not present

## 2011-05-01 DIAGNOSIS — M25559 Pain in unspecified hip: Secondary | ICD-10-CM | POA: Diagnosis not present

## 2011-05-01 DIAGNOSIS — K219 Gastro-esophageal reflux disease without esophagitis: Secondary | ICD-10-CM | POA: Diagnosis not present

## 2011-05-01 DIAGNOSIS — E785 Hyperlipidemia, unspecified: Secondary | ICD-10-CM | POA: Diagnosis not present

## 2011-05-01 DIAGNOSIS — E441 Mild protein-calorie malnutrition: Secondary | ICD-10-CM | POA: Diagnosis not present

## 2011-05-01 DIAGNOSIS — Z79899 Other long term (current) drug therapy: Secondary | ICD-10-CM | POA: Diagnosis not present

## 2011-05-01 DIAGNOSIS — I10 Essential (primary) hypertension: Secondary | ICD-10-CM | POA: Diagnosis not present

## 2011-05-01 DIAGNOSIS — Z0000 Encounter for general adult medical examination without abnormal findings: Secondary | ICD-10-CM | POA: Diagnosis not present

## 2011-05-15 ENCOUNTER — Ambulatory Visit (INDEPENDENT_AMBULATORY_CARE_PROVIDER_SITE_OTHER): Payer: Medicare Other

## 2011-05-15 DIAGNOSIS — Z7901 Long term (current) use of anticoagulants: Secondary | ICD-10-CM

## 2011-05-15 DIAGNOSIS — I4891 Unspecified atrial fibrillation: Secondary | ICD-10-CM

## 2011-05-15 DIAGNOSIS — I480 Paroxysmal atrial fibrillation: Secondary | ICD-10-CM

## 2011-05-28 ENCOUNTER — Other Ambulatory Visit: Payer: Self-pay | Admitting: Cardiology

## 2011-05-30 DIAGNOSIS — M5137 Other intervertebral disc degeneration, lumbosacral region: Secondary | ICD-10-CM | POA: Diagnosis not present

## 2011-05-30 DIAGNOSIS — M25569 Pain in unspecified knee: Secondary | ICD-10-CM | POA: Diagnosis not present

## 2011-06-01 ENCOUNTER — Telehealth: Payer: Self-pay | Admitting: Cardiology

## 2011-06-01 NOTE — Telephone Encounter (Signed)
Pt has been having a irregular heart rate for the last 2 wks that is a little worse than normal When she has this she gets a little light headed no other symptoms

## 2011-06-01 NOTE — Telephone Encounter (Signed)
Patient states has been having irregular heart beats  On and off for 7 to 10 days. Last Sunday when she was in  church she felt dizzy which lasted a minute. Today she had an episode of irregular heart beat in early Am. She feels fine now. Patient is take Metoprolol tartrate 12.5 mg twice a day which she cut it back by herself from 25 mg twice a day. Norma Fredrickson NP recommended for pt  To take an extra 12.5 mg metoprolol as needed for palpitations. Patient aware, she verbalized understanding. She is to call the office  back if needed.

## 2011-06-02 ENCOUNTER — Encounter: Payer: Self-pay | Admitting: Gynecology

## 2011-06-02 DIAGNOSIS — M8580 Other specified disorders of bone density and structure, unspecified site: Secondary | ICD-10-CM | POA: Insufficient documentation

## 2011-06-05 DIAGNOSIS — M1710 Unilateral primary osteoarthritis, unspecified knee: Secondary | ICD-10-CM | POA: Diagnosis not present

## 2011-06-05 DIAGNOSIS — M5137 Other intervertebral disc degeneration, lumbosacral region: Secondary | ICD-10-CM | POA: Diagnosis not present

## 2011-06-05 DIAGNOSIS — M25569 Pain in unspecified knee: Secondary | ICD-10-CM | POA: Diagnosis not present

## 2011-06-07 DIAGNOSIS — M25569 Pain in unspecified knee: Secondary | ICD-10-CM | POA: Diagnosis not present

## 2011-06-07 DIAGNOSIS — M1710 Unilateral primary osteoarthritis, unspecified knee: Secondary | ICD-10-CM | POA: Diagnosis not present

## 2011-06-08 ENCOUNTER — Encounter: Payer: Self-pay | Admitting: Obstetrics and Gynecology

## 2011-06-08 ENCOUNTER — Ambulatory Visit (INDEPENDENT_AMBULATORY_CARE_PROVIDER_SITE_OTHER): Payer: Medicare Other | Admitting: Obstetrics and Gynecology

## 2011-06-08 VITALS — BP 126/76 | Ht 62.0 in | Wt 100.0 lb

## 2011-06-08 DIAGNOSIS — M899 Disorder of bone, unspecified: Secondary | ICD-10-CM | POA: Diagnosis not present

## 2011-06-08 DIAGNOSIS — M8580 Other specified disorders of bone density and structure, unspecified site: Secondary | ICD-10-CM

## 2011-06-08 DIAGNOSIS — M949 Disorder of cartilage, unspecified: Secondary | ICD-10-CM | POA: Diagnosis not present

## 2011-06-08 DIAGNOSIS — N952 Postmenopausal atrophic vaginitis: Secondary | ICD-10-CM | POA: Diagnosis not present

## 2011-06-08 DIAGNOSIS — R350 Frequency of micturition: Secondary | ICD-10-CM

## 2011-06-08 DIAGNOSIS — N318 Other neuromuscular dysfunction of bladder: Secondary | ICD-10-CM

## 2011-06-08 DIAGNOSIS — N319 Neuromuscular dysfunction of bladder, unspecified: Secondary | ICD-10-CM

## 2011-06-08 NOTE — Patient Instructions (Signed)
Schedule mammogram and bone density.

## 2011-06-08 NOTE — Progress Notes (Signed)
Patient came to see me today for further followup. We've been treating her with Estrace cream for atrophic vaginitis. She uses it when needed with excellent results. She is having no vaginal bleeding. She is having no pelvic pain. She does have osteopenia. She had been   several medications with the last one being atelvia. She has not had a fracture since she was a child. She takes calcium and vitamin D. She stopped the atelvia  several months ago because she had trouble remembering it. She is due for bone density and  a mammogram. She does not have dysuria or hematuria or incontinence. She does have some trouble initiating urination and completely emptying her bladder. She does not feel requires intervention.  ROS: 12 system review done. Pertinent positives above. Other positives include paroxysmal atrial fibrillation several times a day requiring anti coagulation. Significant anxiety due to husband's stroke. Spinal stenosis and coronary artery disease.  Physical examination:  Kennon Portela present. HEENT within normal limits. Neck: Thyroid not large. No masses. Supraclavicular nodes: not enlarged. Breasts: Examined in both sitting and lying  position. No skin changes and no masses. Abdomen: Soft no guarding rebound or masses or hernia. Pelvic: External: Within normal limits. BUS: Within normal limits. Vaginal:within normal limits. Good estrogen effect. No evidence of cystocele rectocele or enterocele. Cervix: clean. Uterus: Normal size and shape. Adnexa: No masses. Rectovaginal exam: Confirmatory and negative. Extremities: Within normal limits.  Assessment: #1. Atrophic vaginitis #2. Osteopenia #3. Bladder dysfunction  Plan: Continue Estrace vaginal cream. I think it will also help her bladder. Mammogram and bone density. If patient needs treatment she would like to try monthly Actonel.

## 2011-06-09 LAB — URINALYSIS W MICROSCOPIC + REFLEX CULTURE
Bilirubin Urine: NEGATIVE
Crystals: NONE SEEN
Glucose, UA: NEGATIVE mg/dL
Specific Gravity, Urine: 1.023 (ref 1.005–1.030)
Squamous Epithelial / LPF: NONE SEEN
Urobilinogen, UA: 0.2 mg/dL (ref 0.0–1.0)

## 2011-06-12 DIAGNOSIS — M1710 Unilateral primary osteoarthritis, unspecified knee: Secondary | ICD-10-CM | POA: Diagnosis not present

## 2011-06-12 DIAGNOSIS — M25569 Pain in unspecified knee: Secondary | ICD-10-CM | POA: Diagnosis not present

## 2011-06-12 DIAGNOSIS — M5137 Other intervertebral disc degeneration, lumbosacral region: Secondary | ICD-10-CM | POA: Diagnosis not present

## 2011-06-14 DIAGNOSIS — M25569 Pain in unspecified knee: Secondary | ICD-10-CM | POA: Diagnosis not present

## 2011-06-14 DIAGNOSIS — M1710 Unilateral primary osteoarthritis, unspecified knee: Secondary | ICD-10-CM | POA: Diagnosis not present

## 2011-06-14 DIAGNOSIS — M5137 Other intervertebral disc degeneration, lumbosacral region: Secondary | ICD-10-CM | POA: Diagnosis not present

## 2011-06-15 ENCOUNTER — Other Ambulatory Visit: Payer: Self-pay | Admitting: *Deleted

## 2011-06-15 DIAGNOSIS — Z1231 Encounter for screening mammogram for malignant neoplasm of breast: Secondary | ICD-10-CM | POA: Diagnosis not present

## 2011-06-15 DIAGNOSIS — M949 Disorder of cartilage, unspecified: Secondary | ICD-10-CM | POA: Diagnosis not present

## 2011-06-15 DIAGNOSIS — M899 Disorder of bone, unspecified: Secondary | ICD-10-CM | POA: Diagnosis not present

## 2011-06-15 DIAGNOSIS — M8580 Other specified disorders of bone density and structure, unspecified site: Secondary | ICD-10-CM

## 2011-06-19 DIAGNOSIS — M1710 Unilateral primary osteoarthritis, unspecified knee: Secondary | ICD-10-CM | POA: Diagnosis not present

## 2011-06-19 DIAGNOSIS — M25569 Pain in unspecified knee: Secondary | ICD-10-CM | POA: Diagnosis not present

## 2011-06-19 DIAGNOSIS — M5137 Other intervertebral disc degeneration, lumbosacral region: Secondary | ICD-10-CM | POA: Diagnosis not present

## 2011-06-20 ENCOUNTER — Other Ambulatory Visit: Payer: Self-pay | Admitting: Obstetrics and Gynecology

## 2011-06-20 ENCOUNTER — Encounter: Payer: Self-pay | Admitting: Obstetrics and Gynecology

## 2011-06-20 DIAGNOSIS — M8580 Other specified disorders of bone density and structure, unspecified site: Secondary | ICD-10-CM

## 2011-06-20 DIAGNOSIS — M1710 Unilateral primary osteoarthritis, unspecified knee: Secondary | ICD-10-CM | POA: Diagnosis not present

## 2011-06-20 DIAGNOSIS — M5137 Other intervertebral disc degeneration, lumbosacral region: Secondary | ICD-10-CM | POA: Diagnosis not present

## 2011-06-21 DIAGNOSIS — M1710 Unilateral primary osteoarthritis, unspecified knee: Secondary | ICD-10-CM | POA: Diagnosis not present

## 2011-06-21 DIAGNOSIS — M5137 Other intervertebral disc degeneration, lumbosacral region: Secondary | ICD-10-CM | POA: Diagnosis not present

## 2011-06-21 DIAGNOSIS — M25569 Pain in unspecified knee: Secondary | ICD-10-CM | POA: Diagnosis not present

## 2011-06-26 ENCOUNTER — Ambulatory Visit (INDEPENDENT_AMBULATORY_CARE_PROVIDER_SITE_OTHER): Payer: Medicare Other | Admitting: *Deleted

## 2011-06-26 DIAGNOSIS — Z7901 Long term (current) use of anticoagulants: Secondary | ICD-10-CM

## 2011-06-26 DIAGNOSIS — I4891 Unspecified atrial fibrillation: Secondary | ICD-10-CM

## 2011-06-26 DIAGNOSIS — I480 Paroxysmal atrial fibrillation: Secondary | ICD-10-CM

## 2011-06-26 LAB — POCT INR: INR: 3.3

## 2011-06-27 ENCOUNTER — Telehealth: Payer: Self-pay | Admitting: *Deleted

## 2011-06-27 DIAGNOSIS — M25569 Pain in unspecified knee: Secondary | ICD-10-CM | POA: Diagnosis not present

## 2011-06-27 DIAGNOSIS — M1710 Unilateral primary osteoarthritis, unspecified knee: Secondary | ICD-10-CM | POA: Diagnosis not present

## 2011-06-27 DIAGNOSIS — M5137 Other intervertebral disc degeneration, lumbosacral region: Secondary | ICD-10-CM | POA: Diagnosis not present

## 2011-06-27 NOTE — Telephone Encounter (Signed)
Tell patient bone density shows additional bone loss. Please have her make an appointment to discuss options.

## 2011-06-27 NOTE — Telephone Encounter (Signed)
Pt calling requesting bone density results for 06/15/11. Please advise

## 2011-06-28 NOTE — Telephone Encounter (Signed)
Pt informed with the below note, transferred to make OV.

## 2011-06-29 DIAGNOSIS — M25569 Pain in unspecified knee: Secondary | ICD-10-CM | POA: Diagnosis not present

## 2011-06-29 DIAGNOSIS — M5137 Other intervertebral disc degeneration, lumbosacral region: Secondary | ICD-10-CM | POA: Diagnosis not present

## 2011-06-29 DIAGNOSIS — M1710 Unilateral primary osteoarthritis, unspecified knee: Secondary | ICD-10-CM | POA: Diagnosis not present

## 2011-07-03 DIAGNOSIS — M5137 Other intervertebral disc degeneration, lumbosacral region: Secondary | ICD-10-CM | POA: Diagnosis not present

## 2011-07-03 DIAGNOSIS — M25569 Pain in unspecified knee: Secondary | ICD-10-CM | POA: Diagnosis not present

## 2011-07-03 DIAGNOSIS — M1710 Unilateral primary osteoarthritis, unspecified knee: Secondary | ICD-10-CM | POA: Diagnosis not present

## 2011-07-04 ENCOUNTER — Ambulatory Visit (INDEPENDENT_AMBULATORY_CARE_PROVIDER_SITE_OTHER): Payer: Medicare Other | Admitting: Obstetrics and Gynecology

## 2011-07-04 DIAGNOSIS — M810 Age-related osteoporosis without current pathological fracture: Secondary | ICD-10-CM

## 2011-07-04 NOTE — Progress Notes (Signed)
I asked the patient come in to discuss her bone density with me. It was done this month and it showed osteoporosis with a T score of -2.5. She has lost significant bone in both her spine and hip since her last bone density 2 years ago. She had taken both Fosamax and Boniva in the past but had to stop due to significant GERD. She most recently was on atelvia but has lost bone on that and is having trouble remembering that. She takes calcium and 50,000 IUs of vitamin D every other week. She is trying to exercise but is having trouble due to back problems. She has not had a fracture.  Assessment: Senile osteoporosis  Plan: We have a 25 minute discussion of the above. I believe the ideal drug for her now would be Prolia. She was given information to read. We checked her calcium level and a vitamin D. We will inform her about her lab results along with whether we can get her approved.

## 2011-07-05 LAB — CALCIUM: Calcium: 9.2 mg/dL (ref 8.4–10.5)

## 2011-07-06 DIAGNOSIS — M1710 Unilateral primary osteoarthritis, unspecified knee: Secondary | ICD-10-CM | POA: Diagnosis not present

## 2011-07-06 DIAGNOSIS — M25569 Pain in unspecified knee: Secondary | ICD-10-CM | POA: Diagnosis not present

## 2011-07-06 DIAGNOSIS — M5137 Other intervertebral disc degeneration, lumbosacral region: Secondary | ICD-10-CM | POA: Diagnosis not present

## 2011-07-07 ENCOUNTER — Telehealth: Payer: Self-pay | Admitting: *Deleted

## 2011-07-07 ENCOUNTER — Other Ambulatory Visit: Payer: Self-pay | Admitting: *Deleted

## 2011-07-07 DIAGNOSIS — M810 Age-related osteoporosis without current pathological fracture: Secondary | ICD-10-CM

## 2011-07-07 MED ORDER — DENOSUMAB 60 MG/ML ~~LOC~~ SOLN
60.0000 mg | Freq: Once | SUBCUTANEOUS | Status: AC
  Administered 2011-07-13: 60 mg via SUBCUTANEOUS

## 2011-07-07 NOTE — Telephone Encounter (Signed)
Patient informed copay $35, ordered Prolia and will get injection on 07/13/11.

## 2011-07-07 NOTE — Progress Notes (Signed)
copay $35

## 2011-07-07 NOTE — Telephone Encounter (Signed)
Message copied by Libby Maw on Fri Jul 07, 2011  4:30 PM ------      Message from: Trellis Paganini      Created: Tue Jul 04, 2011  3:47 PM       Please get the patient approved for Prolia. She cannot tolerate Fosamax or Boniva due to GERD. She is continuing to loose bone. She lost bone on atelvia. Please let me know if you get her approved.

## 2011-07-13 ENCOUNTER — Ambulatory Visit (INDEPENDENT_AMBULATORY_CARE_PROVIDER_SITE_OTHER): Payer: Medicare Other

## 2011-07-13 DIAGNOSIS — M899 Disorder of bone, unspecified: Secondary | ICD-10-CM | POA: Diagnosis not present

## 2011-07-13 DIAGNOSIS — M810 Age-related osteoporosis without current pathological fracture: Secondary | ICD-10-CM

## 2011-07-13 DIAGNOSIS — M949 Disorder of cartilage, unspecified: Secondary | ICD-10-CM | POA: Diagnosis not present

## 2011-07-13 DIAGNOSIS — M8580 Other specified disorders of bone density and structure, unspecified site: Secondary | ICD-10-CM

## 2011-07-14 ENCOUNTER — Other Ambulatory Visit: Payer: Self-pay | Admitting: Obstetrics and Gynecology

## 2011-07-17 ENCOUNTER — Ambulatory Visit (INDEPENDENT_AMBULATORY_CARE_PROVIDER_SITE_OTHER): Payer: Medicare Other | Admitting: Pharmacist

## 2011-07-17 DIAGNOSIS — I480 Paroxysmal atrial fibrillation: Secondary | ICD-10-CM

## 2011-07-17 DIAGNOSIS — I4891 Unspecified atrial fibrillation: Secondary | ICD-10-CM | POA: Diagnosis not present

## 2011-07-17 DIAGNOSIS — Z7901 Long term (current) use of anticoagulants: Secondary | ICD-10-CM

## 2011-07-24 ENCOUNTER — Ambulatory Visit (INDEPENDENT_AMBULATORY_CARE_PROVIDER_SITE_OTHER): Payer: Medicare Other | Admitting: Nurse Practitioner

## 2011-07-24 ENCOUNTER — Encounter: Payer: Self-pay | Admitting: Nurse Practitioner

## 2011-07-24 VITALS — BP 132/68 | HR 60 | Ht 62.0 in | Wt 102.0 lb

## 2011-07-24 DIAGNOSIS — R002 Palpitations: Secondary | ICD-10-CM

## 2011-07-24 DIAGNOSIS — I4891 Unspecified atrial fibrillation: Secondary | ICD-10-CM

## 2011-07-24 NOTE — Progress Notes (Signed)
Georgiann Mohs Sternberg Date of Birth: 1933-09-26 Medical Record #[id number]  History of Present Illness: [name] is seen back today for a work in visit. She is seen for Dr. Myrtis Ser. She has a history of PAF, on chronic coumadin, palpitations, mild carotid disease, HTN, HLD, and known CAD with remote cath in 2008 and negative Myoview in February of 2013.    She comes in today. She is here to "talk and understand atrial fib". She is having palpitations. Feels like her heart is pausing and then "thump, thump, thump". She gets quite anxious. Doesn't really feel lightheaded or dizzy. No chest pain. Not really exercising. Blood pressure has been ok at home. She admits that she just gets very anxious and thinks her heart is going to stop altogether. She has had her physical recently with Dr. Pete Glatter and had labs checked there.  Current Outpatient Prescriptions on File Prior to Visit  Medication Sig Dispense Refill  . calcium carbonate (CALCIUM ANTACID) 500 MG chewable tablet Chew 2 tablets by mouth daily.        . Cyanocobalamin (VITAMIN B-12 PO) Take 1 tablet by mouth every 14 (fourteen) days.       . Ergocalciferol (VITAMIN D2 PO) Take 125 mg by mouth every 14 (fourteen) days.       . metoprolol tartrate (LOPRESSOR) 25 MG tablet Take 12.5 mg by mouth 2 (two) times daily.       . Multiple Vitamins-Minerals (OCUVITE PRESERVISION) TABS Take 1 each by mouth daily.        . simvastatin (ZOCOR) 20 MG tablet take 1 tablet by mouth at bedtime  30 tablet  4  . Vitamin D, Ergocalciferol, (DRISDOL) 50000 UNITS CAPS take 1 capsule by mouth EVERY OTHER WEEK  10 capsule  3  . warfarin (COUMADIN) 5 MG tablet Take 5 mg by mouth.      . DISCONTD: estradiol (ESTRACE) 1 MG tablet Take 1 mg by mouth as needed.       [name] DISCONTD: simvastatin (ZOCOR) 20 MG tablet Take 20 mg by mouth every evening.        No Known Allergies  Past Medical History  Diagnosis Date  . Spinal stenosis   . Anxiety   . GERD (gastroesophageal  reflux disease)   . Osteoporosis   . Paroxysmal atrial fibrillation     Coumadin therapy  . Hypertension   . Hyperlipidemia   . CAD (coronary artery disease) 2008/2010    Catheterization 2008, 40% LAD after first diagonal  /  nuclear January, 2010 normal  . Carotid artery disease     Doppler, July, 2011, 0-39% bilateral  . Endometrial polyp   . Atrophic vaginitis   . Vitamin d deficiency   . Stress   . Normal nuclear stress test Feb 2013    No ischemia. Normal wall motion. EF greater than 70%  . Osteopenia     Past Surgical History  Procedure Date  . Laminectomy March 2009    L4 through L5  . Cataract surgery 2009  . Tonsillectomy   . Hysteroscopy 02/07/2005    HYST W D&C, DX: PMB W SMALL ENDO POLYP-SURGEON [name]  . Breast surgery     BENIGN BREAST LUMP  . Excision morton's neuroma 2000  . Hip surgery 2005  . Dilation and curettage of uterus     History  Smoking status  . Never Smoker   Smokeless tobacco  . Never Used    History  Alcohol Use  . Yes  rare    Family History  Problem Relation Age of Onset  . Transient ischemic attack Mother   . Stroke Mother   . Cancer Father     PROSTATE  . Pancreatitis Father   . Stroke Sister   . Spina bifida Brother     Review of Systems: The review of systems is positive for palpitations.  All other systems were reviewed and are negative.  Physical Exam: BP 132/68  Pulse 60  Ht 5\' 2"  (1.575 m)  Wt 102 lb (46.267 kg)  BMI 18.66 kg/m2 Patient is very pleasant and in no acute distress. She is anxious. Skin is warm and dry. Color is normal.  HEENT is unremarkable. Normocephalic/atraumatic. PERRL. Sclera are nonicteric. Neck is supple. No masses. No JVD. Lungs are clear. Cardiac exam shows a regular rate and rhythm. Abdomen is soft. Extremities are without edema. Gait and ROM are intact. No gross neurologic deficits noted.  LABORATORY DATA: EKG today shows sinus rhythm today.   Lab Results  Component Value  Date   INR 1.7 07/17/2011   INR 3.3 06/26/2011   INR 2.2 05/15/2011   PROTIME 19.5 08/25/2008   PROTIME 14.9 08/11/2008     Assessment / Plan:

## 2011-07-24 NOTE — Patient Instructions (Signed)
We are going to put a heart monitor on for the next month  For now, stay on your current medicines  I will see you back in 5 weeks  Stay active  Call the [facility] office at [phone] if you have any questions, problems or concerns.

## 2011-07-24 NOTE — Assessment & Plan Note (Signed)
Patient presents with complaint of palpitations. She wants to wear a monitor and find out what is going on with her. She is on coumadin and I have assured her that this helps protect her from stroke. She is on low dose Metoprolol. We have placed an event monitor today. I will see her back in about 5 weeks to review those findings. For now, no change in her medicines. Patient is agreeable to this plan and will call if any problems develop in the interim.

## 2011-07-31 ENCOUNTER — Ambulatory Visit (INDEPENDENT_AMBULATORY_CARE_PROVIDER_SITE_OTHER): Payer: Medicare Other

## 2011-07-31 DIAGNOSIS — I480 Paroxysmal atrial fibrillation: Secondary | ICD-10-CM

## 2011-07-31 DIAGNOSIS — I4891 Unspecified atrial fibrillation: Secondary | ICD-10-CM

## 2011-07-31 DIAGNOSIS — Z7901 Long term (current) use of anticoagulants: Secondary | ICD-10-CM

## 2011-07-31 LAB — POCT INR: INR: 3.8

## 2011-08-14 ENCOUNTER — Ambulatory Visit (INDEPENDENT_AMBULATORY_CARE_PROVIDER_SITE_OTHER): Payer: Medicare Other

## 2011-08-14 DIAGNOSIS — I4891 Unspecified atrial fibrillation: Secondary | ICD-10-CM | POA: Diagnosis not present

## 2011-08-14 DIAGNOSIS — Z7901 Long term (current) use of anticoagulants: Secondary | ICD-10-CM

## 2011-08-14 DIAGNOSIS — I480 Paroxysmal atrial fibrillation: Secondary | ICD-10-CM

## 2011-08-14 LAB — POCT INR: INR: 3.8

## 2011-08-17 ENCOUNTER — Other Ambulatory Visit: Payer: Self-pay | Admitting: Cardiology

## 2011-08-22 DIAGNOSIS — I4891 Unspecified atrial fibrillation: Secondary | ICD-10-CM | POA: Diagnosis not present

## 2011-08-23 DIAGNOSIS — M9981 Other biomechanical lesions of cervical region: Secondary | ICD-10-CM | POA: Diagnosis not present

## 2011-08-23 DIAGNOSIS — M999 Biomechanical lesion, unspecified: Secondary | ICD-10-CM | POA: Diagnosis not present

## 2011-08-23 DIAGNOSIS — M5030 Other cervical disc degeneration, unspecified cervical region: Secondary | ICD-10-CM | POA: Diagnosis not present

## 2011-08-23 DIAGNOSIS — M5137 Other intervertebral disc degeneration, lumbosacral region: Secondary | ICD-10-CM | POA: Diagnosis not present

## 2011-08-25 DIAGNOSIS — M5030 Other cervical disc degeneration, unspecified cervical region: Secondary | ICD-10-CM | POA: Diagnosis not present

## 2011-08-25 DIAGNOSIS — M5137 Other intervertebral disc degeneration, lumbosacral region: Secondary | ICD-10-CM | POA: Diagnosis not present

## 2011-08-25 DIAGNOSIS — M9981 Other biomechanical lesions of cervical region: Secondary | ICD-10-CM | POA: Diagnosis not present

## 2011-08-25 DIAGNOSIS — M999 Biomechanical lesion, unspecified: Secondary | ICD-10-CM | POA: Diagnosis not present

## 2011-08-28 ENCOUNTER — Ambulatory Visit (INDEPENDENT_AMBULATORY_CARE_PROVIDER_SITE_OTHER): Payer: Medicare Other | Admitting: *Deleted

## 2011-08-28 ENCOUNTER — Encounter: Payer: Self-pay | Admitting: Nurse Practitioner

## 2011-08-28 ENCOUNTER — Ambulatory Visit (INDEPENDENT_AMBULATORY_CARE_PROVIDER_SITE_OTHER): Payer: Medicare Other | Admitting: Nurse Practitioner

## 2011-08-28 VITALS — BP 124/60 | HR 53 | Ht 62.0 in | Wt 100.8 lb

## 2011-08-28 DIAGNOSIS — I4891 Unspecified atrial fibrillation: Secondary | ICD-10-CM

## 2011-08-28 DIAGNOSIS — M999 Biomechanical lesion, unspecified: Secondary | ICD-10-CM | POA: Diagnosis not present

## 2011-08-28 DIAGNOSIS — M5137 Other intervertebral disc degeneration, lumbosacral region: Secondary | ICD-10-CM | POA: Diagnosis not present

## 2011-08-28 DIAGNOSIS — M5030 Other cervical disc degeneration, unspecified cervical region: Secondary | ICD-10-CM | POA: Diagnosis not present

## 2011-08-28 DIAGNOSIS — Z7901 Long term (current) use of anticoagulants: Secondary | ICD-10-CM | POA: Diagnosis not present

## 2011-08-28 DIAGNOSIS — M9981 Other biomechanical lesions of cervical region: Secondary | ICD-10-CM | POA: Diagnosis not present

## 2011-08-28 DIAGNOSIS — I480 Paroxysmal atrial fibrillation: Secondary | ICD-10-CM

## 2011-08-28 LAB — POCT INR: INR: 2.6

## 2011-08-28 NOTE — Patient Instructions (Addendum)
Stay on your current medicines.  We will have you see Dr. Myrtis Ser in about 4 months  Call the [facility] office at [phone] if you have any questions, problems or concerns.

## 2011-08-28 NOTE — Assessment & Plan Note (Signed)
She is doing very well. No change in her current medicines. We will have her see Dr. Myrtis Ser back in about 4 months. Encouraged her to try and gain some weight. Patient is agreeable to this plan and will call if any problems develop in the interim.

## 2011-08-28 NOTE — Progress Notes (Signed)
[name] Date of Birth: 1933-10-14 Medical Record #[id number]  History of Present Illness: [name] is seen today for her follow up visit. She is seen for Dr. Myrtis Ser. She has a history of PAF and is on Coumadin. She has palpitations, mild carotid disease, HTN, HLD and known CAD with remote cath in 2008 and a negative Myoview in February of 2013.   She comes in today. She is doing well and feeling better. We placed an event monitor at her last visit and it was basically benign. No atrial fib. One short run of PSVT. She is feeling better. She stays active. She tolerates her medicines and actually uses 25 mg of Metoprolol BID most of the time and if she is feeling better, she cuts it back to 12.5 mg BID. Her weight continues to be an issue.   Current Outpatient Prescriptions on File Prior to Visit  Medication Sig Dispense Refill  . denosumab (PROLIA) 60 MG/ML SOLN injection Inject 60 mg into the skin every 6 (six) months. Administer in upper arm, thigh, or abdomen      . metoprolol tartrate (LOPRESSOR) 25 MG tablet Take 12.5 mg by mouth 2 (two) times daily.       . Multiple Vitamins-Minerals (OCUVITE PRESERVISION) TABS Take 1 each by mouth daily.        . simvastatin (ZOCOR) 20 MG tablet take 1 tablet by mouth at bedtime  30 tablet  4  . Vitamin D, Ergocalciferol, (DRISDOL) 50000 UNITS CAPS take 1 capsule by mouth EVERY OTHER WEEK  10 capsule  3  . warfarin (COUMADIN) 5 MG tablet TAKE TABLETS BY MOUTH AS DIRECTED BY YOUR DOCTOR  55 tablet  2  . DISCONTD: Ergocalciferol (VITAMIN D2 PO) Take 125 mg by mouth every 14 (fourteen) days.       [name] DISCONTD: estradiol (ESTRACE) 1 MG tablet Take 1 mg by mouth as needed.       [name] DISCONTD: warfarin (COUMADIN) 5 MG tablet Take 5 mg by mouth.        No Known Allergies  Past Medical History  Diagnosis Date  . Spinal stenosis   . Anxiety   . GERD (gastroesophageal reflux disease)   . Osteoporosis   . Paroxysmal atrial fibrillation     Coumadin  therapy  . Hypertension   . Hyperlipidemia   . CAD (coronary artery disease) 2008/2010    Catheterization 2008, 40% LAD after first diagonal  /  nuclear January, 2010 normal  . Carotid artery disease     Doppler, July, 2011, 0-39% bilateral  . Endometrial polyp   . Atrophic vaginitis   . Vitamin d deficiency   . Stress   . Normal nuclear stress test Feb 2013    No ischemia. Normal wall motion. EF greater than 70%  . Osteopenia     Past Surgical History  Procedure Date  . Laminectomy March 2009    L4 through L5  . Cataract surgery 2009  . Tonsillectomy   . Hysteroscopy 02/07/2005    HYST W D&C, DX: PMB W SMALL ENDO POLYP-SURGEON [name]  . Breast surgery     BENIGN BREAST LUMP  . Excision morton's neuroma 2000  . Hip surgery 2005  . Dilation and curettage of uterus     History  Smoking status  . Never Smoker   Smokeless tobacco  . Never Used    History  Alcohol Use  . Yes    rare    Family History  Problem  Relation Age of Onset  . Transient ischemic attack Mother   . Stroke Mother   . Cancer Father     PROSTATE  . Pancreatitis Father   . Stroke Sister   . Spina bifida Brother     Review of Systems: The review of systems is per the HPI.  All other systems were reviewed and are negative.  Physical Exam: BP 124/60  Pulse 53  Ht 5\' 2"  (1.575 m)  Wt 100 lb 12.8 oz (45.723 kg)  BMI 18.44 kg/m2  SpO2 96% Patient is very pleasant and in no acute distress. She is quite thin. Skin is warm and dry. Color is normal.  HEENT is unremarkable. Normocephalic/atraumatic. PERRL. Sclera are nonicteric. Neck is supple. No masses. No JVD. Lungs are clear. Cardiac exam shows a regular rate and rhythm. Abdomen is soft. Extremities are without edema. Gait and ROM are intact. No gross neurologic deficits noted.   LABORATORY DATA: Event monitor was reviewed. Basically benign. One short run of PSVT. No PAF.    Assessment / Plan:

## 2011-08-30 DIAGNOSIS — M9981 Other biomechanical lesions of cervical region: Secondary | ICD-10-CM | POA: Diagnosis not present

## 2011-08-30 DIAGNOSIS — M5137 Other intervertebral disc degeneration, lumbosacral region: Secondary | ICD-10-CM | POA: Diagnosis not present

## 2011-08-30 DIAGNOSIS — M999 Biomechanical lesion, unspecified: Secondary | ICD-10-CM | POA: Diagnosis not present

## 2011-08-30 DIAGNOSIS — M5030 Other cervical disc degeneration, unspecified cervical region: Secondary | ICD-10-CM | POA: Diagnosis not present

## 2011-09-01 DIAGNOSIS — M999 Biomechanical lesion, unspecified: Secondary | ICD-10-CM | POA: Diagnosis not present

## 2011-09-01 DIAGNOSIS — M9981 Other biomechanical lesions of cervical region: Secondary | ICD-10-CM | POA: Diagnosis not present

## 2011-09-01 DIAGNOSIS — M5030 Other cervical disc degeneration, unspecified cervical region: Secondary | ICD-10-CM | POA: Diagnosis not present

## 2011-09-01 DIAGNOSIS — M5137 Other intervertebral disc degeneration, lumbosacral region: Secondary | ICD-10-CM | POA: Diagnosis not present

## 2011-09-04 ENCOUNTER — Ambulatory Visit (INDEPENDENT_AMBULATORY_CARE_PROVIDER_SITE_OTHER): Payer: Medicare Other | Admitting: Emergency Medicine

## 2011-09-04 VITALS — BP 177/68 | HR 66 | Temp 97.5°F | Resp 16 | Ht 63.0 in | Wt 98.0 lb

## 2011-09-04 DIAGNOSIS — H9209 Otalgia, unspecified ear: Secondary | ICD-10-CM | POA: Diagnosis not present

## 2011-09-04 DIAGNOSIS — M26609 Unspecified temporomandibular joint disorder, unspecified side: Secondary | ICD-10-CM | POA: Diagnosis not present

## 2011-09-04 DIAGNOSIS — R4589 Other symptoms and signs involving emotional state: Secondary | ICD-10-CM

## 2011-09-04 DIAGNOSIS — H9190 Unspecified hearing loss, unspecified ear: Secondary | ICD-10-CM

## 2011-09-04 NOTE — Progress Notes (Signed)
  Subjective:    Patient ID: [name], female    DOB: [birth year], 78 y.o.   MRN: [id number]  HPI patient enters with pain around her right ear. She states she's been under a great deal of stress recently with her husband recently having a stroke. Also has a mild right facial pain. She has noticed some decreased hearing in the right ear.    Review of Systems     Objective:   Physical Exam left TM is normal the ITM is essentially normal. There is tenderness over the right TMJ as well as pain with opening of the jaw. The throat is normal. The neck is supple. Chest is clear        Assessment & Plan:

## 2011-09-04 NOTE — Patient Instructions (Addendum)
Temporomandibular Joint Pain Your exam shows that you have a problem with your temporomandibular joint (TMJ), the joint that moves when you open your mouth or chew food. TMJ problems can result from direct injuries, bite abnormalities, or tension states which cause you to grind or clench your teeth. Typical symptoms include pain around the joint, clicking, restricted movement, and headaches. The TMJ is like any other joint in the body; when it is strained, it needs rest to repair itself. To keep the joint at rest it is important that you do not open your mouth wider than the width of your index finger. If you must yawn, be sure to support your chin with your hand so your mouth does not open wide. Eat a soft diet (nothing firmer than ground beef, no raw vegetables), do not chew gum and do not talk if it causes you pain. Apply topical heat by using a warm, moist cloth placed in front of the ear for 15 to 20 minutes several times daily. Alternating heat and ice may give even more relief. Anti-inflammatory pain medicine and muscle relaxants can also be helpful. A dental orthotic or splint may be used for temporary relief. Long-term problems may require treatment for stress as well as braces or surgery. Please check with your doctor or dentist if your symptoms do not improve within one week. Document Released: 04/06/2004 Document Revised: 02/16/2011 Document Reviewed: 02/27/2005 [facility] Patient Information 2012 [place], [place].   Tylenol only for pain because you are on Coumadin. Consult your dentist if you continue to have symptoms .

## 2011-09-11 ENCOUNTER — Ambulatory Visit (INDEPENDENT_AMBULATORY_CARE_PROVIDER_SITE_OTHER): Payer: Medicare Other | Admitting: *Deleted

## 2011-09-11 DIAGNOSIS — M5137 Other intervertebral disc degeneration, lumbosacral region: Secondary | ICD-10-CM | POA: Diagnosis not present

## 2011-09-11 DIAGNOSIS — M999 Biomechanical lesion, unspecified: Secondary | ICD-10-CM | POA: Diagnosis not present

## 2011-09-11 DIAGNOSIS — I4891 Unspecified atrial fibrillation: Secondary | ICD-10-CM

## 2011-09-11 DIAGNOSIS — Z7901 Long term (current) use of anticoagulants: Secondary | ICD-10-CM | POA: Diagnosis not present

## 2011-09-11 DIAGNOSIS — M9981 Other biomechanical lesions of cervical region: Secondary | ICD-10-CM | POA: Diagnosis not present

## 2011-09-11 DIAGNOSIS — M5030 Other cervical disc degeneration, unspecified cervical region: Secondary | ICD-10-CM | POA: Diagnosis not present

## 2011-09-11 DIAGNOSIS — I480 Paroxysmal atrial fibrillation: Secondary | ICD-10-CM

## 2011-09-15 DIAGNOSIS — M999 Biomechanical lesion, unspecified: Secondary | ICD-10-CM | POA: Diagnosis not present

## 2011-09-15 DIAGNOSIS — M5030 Other cervical disc degeneration, unspecified cervical region: Secondary | ICD-10-CM | POA: Diagnosis not present

## 2011-09-15 DIAGNOSIS — M5137 Other intervertebral disc degeneration, lumbosacral region: Secondary | ICD-10-CM | POA: Diagnosis not present

## 2011-09-15 DIAGNOSIS — M9981 Other biomechanical lesions of cervical region: Secondary | ICD-10-CM | POA: Diagnosis not present

## 2011-09-18 ENCOUNTER — Ambulatory Visit (INDEPENDENT_AMBULATORY_CARE_PROVIDER_SITE_OTHER): Payer: Medicare Other | Admitting: *Deleted

## 2011-09-18 DIAGNOSIS — Z7901 Long term (current) use of anticoagulants: Secondary | ICD-10-CM | POA: Diagnosis not present

## 2011-09-18 DIAGNOSIS — I4891 Unspecified atrial fibrillation: Secondary | ICD-10-CM | POA: Diagnosis not present

## 2011-09-18 DIAGNOSIS — I480 Paroxysmal atrial fibrillation: Secondary | ICD-10-CM

## 2011-09-18 LAB — POCT INR: INR: 1.8

## 2011-09-22 DIAGNOSIS — M999 Biomechanical lesion, unspecified: Secondary | ICD-10-CM | POA: Diagnosis not present

## 2011-09-22 DIAGNOSIS — M9981 Other biomechanical lesions of cervical region: Secondary | ICD-10-CM | POA: Diagnosis not present

## 2011-09-22 DIAGNOSIS — M5030 Other cervical disc degeneration, unspecified cervical region: Secondary | ICD-10-CM | POA: Diagnosis not present

## 2011-09-22 DIAGNOSIS — M5137 Other intervertebral disc degeneration, lumbosacral region: Secondary | ICD-10-CM | POA: Diagnosis not present

## 2011-09-29 DIAGNOSIS — M5137 Other intervertebral disc degeneration, lumbosacral region: Secondary | ICD-10-CM | POA: Diagnosis not present

## 2011-09-29 DIAGNOSIS — M5030 Other cervical disc degeneration, unspecified cervical region: Secondary | ICD-10-CM | POA: Diagnosis not present

## 2011-09-29 DIAGNOSIS — M9981 Other biomechanical lesions of cervical region: Secondary | ICD-10-CM | POA: Diagnosis not present

## 2011-09-29 DIAGNOSIS — M999 Biomechanical lesion, unspecified: Secondary | ICD-10-CM | POA: Diagnosis not present

## 2011-10-02 ENCOUNTER — Ambulatory Visit (INDEPENDENT_AMBULATORY_CARE_PROVIDER_SITE_OTHER): Payer: Medicare Other

## 2011-10-02 DIAGNOSIS — I4891 Unspecified atrial fibrillation: Secondary | ICD-10-CM | POA: Diagnosis not present

## 2011-10-02 DIAGNOSIS — Z7901 Long term (current) use of anticoagulants: Secondary | ICD-10-CM | POA: Diagnosis not present

## 2011-10-02 DIAGNOSIS — I480 Paroxysmal atrial fibrillation: Secondary | ICD-10-CM

## 2011-10-02 LAB — POCT INR: INR: 2.1

## 2011-10-13 DIAGNOSIS — M5137 Other intervertebral disc degeneration, lumbosacral region: Secondary | ICD-10-CM | POA: Diagnosis not present

## 2011-10-13 DIAGNOSIS — M999 Biomechanical lesion, unspecified: Secondary | ICD-10-CM | POA: Diagnosis not present

## 2011-10-13 DIAGNOSIS — M9981 Other biomechanical lesions of cervical region: Secondary | ICD-10-CM | POA: Diagnosis not present

## 2011-10-13 DIAGNOSIS — M5030 Other cervical disc degeneration, unspecified cervical region: Secondary | ICD-10-CM | POA: Diagnosis not present

## 2011-10-16 ENCOUNTER — Encounter: Payer: Self-pay | Admitting: Emergency Medicine

## 2011-10-16 DIAGNOSIS — I10 Essential (primary) hypertension: Secondary | ICD-10-CM | POA: Diagnosis not present

## 2011-10-16 DIAGNOSIS — G479 Sleep disorder, unspecified: Secondary | ICD-10-CM | POA: Diagnosis not present

## 2011-10-16 DIAGNOSIS — Z1331 Encounter for screening for depression: Secondary | ICD-10-CM | POA: Diagnosis not present

## 2011-10-16 DIAGNOSIS — R634 Abnormal weight loss: Secondary | ICD-10-CM | POA: Diagnosis not present

## 2011-10-16 DIAGNOSIS — K219 Gastro-esophageal reflux disease without esophagitis: Secondary | ICD-10-CM | POA: Diagnosis not present

## 2011-10-23 ENCOUNTER — Ambulatory Visit (INDEPENDENT_AMBULATORY_CARE_PROVIDER_SITE_OTHER): Payer: Medicare Other | Admitting: Pharmacist

## 2011-10-23 DIAGNOSIS — Z7901 Long term (current) use of anticoagulants: Secondary | ICD-10-CM | POA: Diagnosis not present

## 2011-10-23 DIAGNOSIS — M5030 Other cervical disc degeneration, unspecified cervical region: Secondary | ICD-10-CM | POA: Diagnosis not present

## 2011-10-23 DIAGNOSIS — M9981 Other biomechanical lesions of cervical region: Secondary | ICD-10-CM | POA: Diagnosis not present

## 2011-10-23 DIAGNOSIS — I4891 Unspecified atrial fibrillation: Secondary | ICD-10-CM | POA: Diagnosis not present

## 2011-10-23 DIAGNOSIS — M5137 Other intervertebral disc degeneration, lumbosacral region: Secondary | ICD-10-CM | POA: Diagnosis not present

## 2011-10-23 DIAGNOSIS — M999 Biomechanical lesion, unspecified: Secondary | ICD-10-CM | POA: Diagnosis not present

## 2011-10-23 DIAGNOSIS — I480 Paroxysmal atrial fibrillation: Secondary | ICD-10-CM

## 2011-11-06 DIAGNOSIS — M999 Biomechanical lesion, unspecified: Secondary | ICD-10-CM | POA: Diagnosis not present

## 2011-11-06 DIAGNOSIS — M5137 Other intervertebral disc degeneration, lumbosacral region: Secondary | ICD-10-CM | POA: Diagnosis not present

## 2011-11-06 DIAGNOSIS — M9981 Other biomechanical lesions of cervical region: Secondary | ICD-10-CM | POA: Diagnosis not present

## 2011-11-06 DIAGNOSIS — M5030 Other cervical disc degeneration, unspecified cervical region: Secondary | ICD-10-CM | POA: Diagnosis not present

## 2011-11-14 ENCOUNTER — Ambulatory Visit (INDEPENDENT_AMBULATORY_CARE_PROVIDER_SITE_OTHER): Payer: Medicare Other | Admitting: Pharmacist

## 2011-11-14 DIAGNOSIS — Z7901 Long term (current) use of anticoagulants: Secondary | ICD-10-CM

## 2011-11-14 DIAGNOSIS — I480 Paroxysmal atrial fibrillation: Secondary | ICD-10-CM

## 2011-11-14 DIAGNOSIS — I4891 Unspecified atrial fibrillation: Secondary | ICD-10-CM

## 2011-11-14 LAB — PROTIME-INR: INR: 6.5 ratio (ref 0.8–1.0)

## 2011-11-17 ENCOUNTER — Ambulatory Visit (INDEPENDENT_AMBULATORY_CARE_PROVIDER_SITE_OTHER): Payer: Medicare Other | Admitting: *Deleted

## 2011-11-17 DIAGNOSIS — Z7901 Long term (current) use of anticoagulants: Secondary | ICD-10-CM | POA: Diagnosis not present

## 2011-11-17 DIAGNOSIS — I4891 Unspecified atrial fibrillation: Secondary | ICD-10-CM

## 2011-11-17 DIAGNOSIS — I480 Paroxysmal atrial fibrillation: Secondary | ICD-10-CM

## 2011-11-20 DIAGNOSIS — M999 Biomechanical lesion, unspecified: Secondary | ICD-10-CM | POA: Diagnosis not present

## 2011-11-20 DIAGNOSIS — M5137 Other intervertebral disc degeneration, lumbosacral region: Secondary | ICD-10-CM | POA: Diagnosis not present

## 2011-11-20 DIAGNOSIS — M5030 Other cervical disc degeneration, unspecified cervical region: Secondary | ICD-10-CM | POA: Diagnosis not present

## 2011-11-20 DIAGNOSIS — M9981 Other biomechanical lesions of cervical region: Secondary | ICD-10-CM | POA: Diagnosis not present

## 2011-11-27 ENCOUNTER — Ambulatory Visit (INDEPENDENT_AMBULATORY_CARE_PROVIDER_SITE_OTHER): Payer: Medicare Other | Admitting: *Deleted

## 2011-11-27 DIAGNOSIS — I480 Paroxysmal atrial fibrillation: Secondary | ICD-10-CM

## 2011-11-27 DIAGNOSIS — I4891 Unspecified atrial fibrillation: Secondary | ICD-10-CM | POA: Diagnosis not present

## 2011-11-27 DIAGNOSIS — Z7901 Long term (current) use of anticoagulants: Secondary | ICD-10-CM

## 2011-12-01 DIAGNOSIS — M999 Biomechanical lesion, unspecified: Secondary | ICD-10-CM | POA: Diagnosis not present

## 2011-12-01 DIAGNOSIS — M5137 Other intervertebral disc degeneration, lumbosacral region: Secondary | ICD-10-CM | POA: Diagnosis not present

## 2011-12-01 DIAGNOSIS — M5030 Other cervical disc degeneration, unspecified cervical region: Secondary | ICD-10-CM | POA: Diagnosis not present

## 2011-12-01 DIAGNOSIS — M9981 Other biomechanical lesions of cervical region: Secondary | ICD-10-CM | POA: Diagnosis not present

## 2011-12-11 ENCOUNTER — Ambulatory Visit (INDEPENDENT_AMBULATORY_CARE_PROVIDER_SITE_OTHER): Payer: Medicare Other

## 2011-12-11 DIAGNOSIS — I4891 Unspecified atrial fibrillation: Secondary | ICD-10-CM

## 2011-12-11 DIAGNOSIS — Z7901 Long term (current) use of anticoagulants: Secondary | ICD-10-CM

## 2011-12-11 DIAGNOSIS — I480 Paroxysmal atrial fibrillation: Secondary | ICD-10-CM

## 2011-12-11 LAB — POCT INR: INR: 2.3

## 2011-12-12 ENCOUNTER — Encounter: Payer: Self-pay | Admitting: Cardiology

## 2011-12-12 DIAGNOSIS — R002 Palpitations: Secondary | ICD-10-CM | POA: Insufficient documentation

## 2011-12-14 ENCOUNTER — Ambulatory Visit (INDEPENDENT_AMBULATORY_CARE_PROVIDER_SITE_OTHER): Payer: Medicare Other | Admitting: Cardiology

## 2011-12-14 ENCOUNTER — Encounter: Payer: Self-pay | Admitting: Cardiology

## 2011-12-14 VITALS — BP 144/66 | HR 93 | Ht 62.0 in | Wt 102.0 lb

## 2011-12-14 DIAGNOSIS — I2510 Atherosclerotic heart disease of native coronary artery without angina pectoris: Secondary | ICD-10-CM

## 2011-12-14 DIAGNOSIS — R0989 Other specified symptoms and signs involving the circulatory and respiratory systems: Secondary | ICD-10-CM

## 2011-12-14 DIAGNOSIS — I4891 Unspecified atrial fibrillation: Secondary | ICD-10-CM

## 2011-12-14 DIAGNOSIS — I10 Essential (primary) hypertension: Secondary | ICD-10-CM

## 2011-12-14 DIAGNOSIS — E785 Hyperlipidemia, unspecified: Secondary | ICD-10-CM

## 2011-12-14 DIAGNOSIS — Z7901 Long term (current) use of anticoagulants: Secondary | ICD-10-CM

## 2011-12-14 DIAGNOSIS — I779 Disorder of arteries and arterioles, unspecified: Secondary | ICD-10-CM

## 2011-12-14 DIAGNOSIS — I480 Paroxysmal atrial fibrillation: Secondary | ICD-10-CM

## 2011-12-14 DIAGNOSIS — R002 Palpitations: Secondary | ICD-10-CM

## 2011-12-14 DIAGNOSIS — R9430 Abnormal result of cardiovascular function study, unspecified: Secondary | ICD-10-CM

## 2011-12-14 MED ORDER — SIMVASTATIN 20 MG PO TABS: 20.0000 mg | ORAL_TABLET | Freq: Every day | ORAL | Status: DC

## 2011-12-14 NOTE — Assessment & Plan Note (Signed)
Coronary disease is stable. She has mild coronary disease. Nuclear scan in February, 2013 revealed no ischemia. No further workup.

## 2011-12-14 NOTE — Assessment & Plan Note (Signed)
Blood pressures control today. No change in therapy. 

## 2011-12-14 NOTE — Progress Notes (Signed)
HPI  Patient is seen today for followup mild coronary disease and paroxysmal atrial fibrillation. Over the past few months she has been assessed here in the office for palpitations and some chest pain. She had a nuclear study in February, 2013 revealing a normal ejection fraction and no ischemia. She were monitor that showed no atrial fib. She had a very short burst of paroxysmal supraventricular tachycardia. She actually is feeling well.  She is able to say today that she thinks that many of her symptoms over the past year have been related to stress as at home. Her husband had a stroke and he is recovering. He still has decreased speech and some weakness in his right arm. There have been any changes in her home. These have now stabilized and she is doing well. She is not having symptoms. She has made her diet at home much healthier than the past. On this basis she thought she might not need a statin any longer and she stopped her statin.  No Known Allergies  Current Outpatient Prescriptions  Medication Sig Dispense Refill  . metoprolol tartrate (LOPRESSOR) 25 MG tablet Take 12.5 mg by mouth 2 (two) times daily.       . Multiple Vitamins-Minerals (OCUVITE PRESERVISION) TABS Take 1 each by mouth daily.        . Vitamin D, Ergocalciferol, (DRISDOL) 50000 UNITS CAPS take 1 capsule by mouth EVERY OTHER WEEK  10 capsule  3  . warfarin (COUMADIN) 5 MG tablet TAKE TABLETS BY MOUTH AS DIRECTED BY YOUR DOCTOR  55 tablet  2  . DISCONTD: estradiol (ESTRACE) 1 MG tablet Take 1 mg by mouth as needed.         History   Social History  . Marital Status: Married    Spouse Name: N/A    Number of Children: N/A  . Years of Education: N/A   Occupational History  . Retired    Social History Main Topics  . Smoking status: Never Smoker   . Smokeless tobacco: Never Used  . Alcohol Use: Yes     rare  . Drug Use: No  . Sexually Active: No   Other Topics Concern  . Not on file   Social History  Narrative   Retired; Married;Has had increased stress as her husband had a stroke ~ 3wks ago and she has been taking care of him. (04/07/11)    Family History  Problem Relation Age of Onset  . Transient ischemic attack Mother   . Stroke Mother   . Cancer Father     PROSTATE  . Pancreatitis Father   . Stroke Sister   . Spina bifida Brother     Past Medical History  Diagnosis Date  . Spinal stenosis   . Anxiety   . GERD (gastroesophageal reflux disease)   . Osteoporosis   . Paroxysmal atrial fibrillation     Coumadin therapy  . Hypertension   . Hyperlipidemia   . CAD (coronary artery disease) 2008/2010    Catheterization 2008, 40% LAD after first diagonal  /  nuclear January, 2010 normal  . Carotid artery disease     Doppler, July, 2011, 0-39% bilateral  . Endometrial polyp   . Atrophic vaginitis   . Vitamin d deficiency   . Stress   . Normal nuclear stress test Feb 2013    No ischemia. Normal wall motion. EF greater than 70%  . Osteopenia   . Palpitations     Monitor, May, 2013, no  atrial fib,  one short run of PSVT    Past Surgical History  Procedure Date  . Laminectomy March 2009    L4 through L5  . Cataract surgery 2009  . Tonsillectomy   . Hysteroscopy 02/07/2005    HYST W D&C, DX: PMB W SMALL ENDO POLYP-SURGEON [name]  . Breast surgery     BENIGN BREAST LUMP  . Excision morton's neuroma 2000  . Hip surgery 2005  . Dilation and curettage of uterus     ROS   Patient denies fever, chills, headache, sweats, rash, change in vision, change in hearing, chest pain, cough, nausea vomiting, urinary symptoms. All of the systems are reviewed and are negative.  PHYSICAL EXAM  Patient is oriented to person time and place. Affect is normal. There is no jugulovenous distention. There no carotid bruits. Lungs are clear. Respiratory effort is nonlabored. Cardiac exam reveals S1 and S2. There no clicks or significant murmurs. The abdomen is soft. There is no peripheral  edema.  Filed Vitals:   12/14/11 1126  BP: 144/66  Pulse: 93  Height: 5\' 2"  (1.575 m)  Weight: 102 lb (46.267 kg)  SpO2: 89%     ASSESSMENT & PLAN

## 2011-12-14 NOTE — Assessment & Plan Note (Signed)
The patient had elevated cholesterol in the past. With low dose statin she had a good response. She does have mild coronary disease. Recently she stopped her statin because she felt her diet at home so much better. She has not had any difficulty with the medication. She and I had a very complete discussion about this. I explained to her that I would like for her to continue her statin because of her documented coronary disease. This decision is independent of what her LDL may be at this time. She is in agreement. She will restart the medication.

## 2011-12-14 NOTE — Patient Instructions (Addendum)
Your physician has recommended you make the following change in your medication:  1.) START SIMVASTATIN ONE TABLET (20 MG) DAILY 2.)  I SENT IN YOUR SCRIPT TO YOUR PHARMACY  Your physician recommends that you return for a FASTING lipid profile: IN 6-8 WEEKS   Your physician wants you to follow-up in: 1 YEAR WITH DR. KATZ.  You will receive a reminder letter in the mail two months in advance. If you don't receive a letter, please call our office to schedule the follow-up appointment.

## 2011-12-14 NOTE — Assessment & Plan Note (Signed)
She has not had any significant palpitations lately. No change in therapy.

## 2011-12-14 NOTE — Assessment & Plan Note (Signed)
Patient continues on Coumadin further history of paroxysmal atrial fibrillation.

## 2011-12-14 NOTE — Assessment & Plan Note (Signed)
She has not had any recent palpitations. She feels better now that her home situation is more stabilized.

## 2011-12-14 NOTE — Assessment & Plan Note (Signed)
She has mild carotid disease. She does not need a followup study this year. I will consider a followup Doppler next year.

## 2011-12-15 DIAGNOSIS — M999 Biomechanical lesion, unspecified: Secondary | ICD-10-CM | POA: Diagnosis not present

## 2011-12-15 DIAGNOSIS — M5030 Other cervical disc degeneration, unspecified cervical region: Secondary | ICD-10-CM | POA: Diagnosis not present

## 2011-12-15 DIAGNOSIS — M5137 Other intervertebral disc degeneration, lumbosacral region: Secondary | ICD-10-CM | POA: Diagnosis not present

## 2011-12-15 DIAGNOSIS — M9981 Other biomechanical lesions of cervical region: Secondary | ICD-10-CM | POA: Diagnosis not present

## 2011-12-29 DIAGNOSIS — M999 Biomechanical lesion, unspecified: Secondary | ICD-10-CM | POA: Diagnosis not present

## 2011-12-29 DIAGNOSIS — M9981 Other biomechanical lesions of cervical region: Secondary | ICD-10-CM | POA: Diagnosis not present

## 2011-12-29 DIAGNOSIS — M5030 Other cervical disc degeneration, unspecified cervical region: Secondary | ICD-10-CM | POA: Diagnosis not present

## 2011-12-29 DIAGNOSIS — M5137 Other intervertebral disc degeneration, lumbosacral region: Secondary | ICD-10-CM | POA: Diagnosis not present

## 2012-01-01 DIAGNOSIS — H2640 Unspecified secondary cataract: Secondary | ICD-10-CM | POA: Diagnosis not present

## 2012-01-01 DIAGNOSIS — Z961 Presence of intraocular lens: Secondary | ICD-10-CM | POA: Diagnosis not present

## 2012-01-01 DIAGNOSIS — H3530 Unspecified macular degeneration: Secondary | ICD-10-CM | POA: Diagnosis not present

## 2012-01-02 ENCOUNTER — Other Ambulatory Visit: Payer: Self-pay | Admitting: Cardiology

## 2012-01-02 ENCOUNTER — Telehealth: Payer: Self-pay | Admitting: *Deleted

## 2012-01-02 NOTE — Telephone Encounter (Signed)
Pt due for Prolia injection. LM with a man that answered the phone at her home #. KW

## 2012-01-08 ENCOUNTER — Ambulatory Visit (INDEPENDENT_AMBULATORY_CARE_PROVIDER_SITE_OTHER): Payer: Medicare Other

## 2012-01-08 DIAGNOSIS — Z7901 Long term (current) use of anticoagulants: Secondary | ICD-10-CM | POA: Diagnosis not present

## 2012-01-08 DIAGNOSIS — I4891 Unspecified atrial fibrillation: Secondary | ICD-10-CM | POA: Diagnosis not present

## 2012-01-08 DIAGNOSIS — I480 Paroxysmal atrial fibrillation: Secondary | ICD-10-CM

## 2012-01-08 LAB — POCT INR: INR: 2.5

## 2012-01-12 DIAGNOSIS — M5030 Other cervical disc degeneration, unspecified cervical region: Secondary | ICD-10-CM | POA: Diagnosis not present

## 2012-01-12 DIAGNOSIS — M5137 Other intervertebral disc degeneration, lumbosacral region: Secondary | ICD-10-CM | POA: Diagnosis not present

## 2012-01-12 DIAGNOSIS — M999 Biomechanical lesion, unspecified: Secondary | ICD-10-CM | POA: Diagnosis not present

## 2012-01-12 DIAGNOSIS — M9981 Other biomechanical lesions of cervical region: Secondary | ICD-10-CM | POA: Diagnosis not present

## 2012-01-22 ENCOUNTER — Other Ambulatory Visit (INDEPENDENT_AMBULATORY_CARE_PROVIDER_SITE_OTHER): Payer: Medicare Other

## 2012-01-22 DIAGNOSIS — E785 Hyperlipidemia, unspecified: Secondary | ICD-10-CM

## 2012-01-22 DIAGNOSIS — R002 Palpitations: Secondary | ICD-10-CM

## 2012-01-22 DIAGNOSIS — I4891 Unspecified atrial fibrillation: Secondary | ICD-10-CM | POA: Diagnosis not present

## 2012-01-22 DIAGNOSIS — R0989 Other specified symptoms and signs involving the circulatory and respiratory systems: Secondary | ICD-10-CM | POA: Diagnosis not present

## 2012-01-22 DIAGNOSIS — I10 Essential (primary) hypertension: Secondary | ICD-10-CM

## 2012-01-22 DIAGNOSIS — I2510 Atherosclerotic heart disease of native coronary artery without angina pectoris: Secondary | ICD-10-CM

## 2012-01-22 DIAGNOSIS — Z7901 Long term (current) use of anticoagulants: Secondary | ICD-10-CM | POA: Diagnosis not present

## 2012-01-22 DIAGNOSIS — I779 Disorder of arteries and arterioles, unspecified: Secondary | ICD-10-CM

## 2012-01-22 DIAGNOSIS — R9430 Abnormal result of cardiovascular function study, unspecified: Secondary | ICD-10-CM

## 2012-01-22 DIAGNOSIS — I480 Paroxysmal atrial fibrillation: Secondary | ICD-10-CM

## 2012-01-22 LAB — LIPID PANEL
Total CHOL/HDL Ratio: 3
Triglycerides: 58 mg/dL (ref 0.0–149.0)

## 2012-01-26 ENCOUNTER — Encounter: Payer: Self-pay | Admitting: *Deleted

## 2012-01-26 DIAGNOSIS — M810 Age-related osteoporosis without current pathological fracture: Secondary | ICD-10-CM | POA: Diagnosis not present

## 2012-01-26 DIAGNOSIS — N952 Postmenopausal atrophic vaginitis: Secondary | ICD-10-CM | POA: Diagnosis not present

## 2012-01-26 NOTE — Telephone Encounter (Signed)
Letter mailed to pt since no response from left messages. KW 

## 2012-01-29 NOTE — Telephone Encounter (Signed)
Pt called me back and left a message that she has moved doctors offices since Dr Reece Agar is retiring. KW

## 2012-02-05 DIAGNOSIS — M5137 Other intervertebral disc degeneration, lumbosacral region: Secondary | ICD-10-CM | POA: Diagnosis not present

## 2012-02-05 DIAGNOSIS — M999 Biomechanical lesion, unspecified: Secondary | ICD-10-CM | POA: Diagnosis not present

## 2012-02-05 DIAGNOSIS — M9981 Other biomechanical lesions of cervical region: Secondary | ICD-10-CM | POA: Diagnosis not present

## 2012-02-05 DIAGNOSIS — M5030 Other cervical disc degeneration, unspecified cervical region: Secondary | ICD-10-CM | POA: Diagnosis not present

## 2012-02-15 DIAGNOSIS — H26499 Other secondary cataract, unspecified eye: Secondary | ICD-10-CM | POA: Diagnosis not present

## 2012-02-15 DIAGNOSIS — H2640 Unspecified secondary cataract: Secondary | ICD-10-CM | POA: Diagnosis not present

## 2012-02-19 ENCOUNTER — Ambulatory Visit (INDEPENDENT_AMBULATORY_CARE_PROVIDER_SITE_OTHER): Payer: Medicare Other

## 2012-02-19 DIAGNOSIS — I4891 Unspecified atrial fibrillation: Secondary | ICD-10-CM | POA: Diagnosis not present

## 2012-02-19 DIAGNOSIS — Z7901 Long term (current) use of anticoagulants: Secondary | ICD-10-CM

## 2012-02-19 DIAGNOSIS — I480 Paroxysmal atrial fibrillation: Secondary | ICD-10-CM

## 2012-02-19 LAB — POCT INR: INR: 1.9

## 2012-02-26 DIAGNOSIS — M5137 Other intervertebral disc degeneration, lumbosacral region: Secondary | ICD-10-CM | POA: Diagnosis not present

## 2012-02-28 DIAGNOSIS — M5137 Other intervertebral disc degeneration, lumbosacral region: Secondary | ICD-10-CM | POA: Diagnosis not present

## 2012-02-28 DIAGNOSIS — M5030 Other cervical disc degeneration, unspecified cervical region: Secondary | ICD-10-CM | POA: Diagnosis not present

## 2012-02-28 DIAGNOSIS — M9981 Other biomechanical lesions of cervical region: Secondary | ICD-10-CM | POA: Diagnosis not present

## 2012-02-28 DIAGNOSIS — M999 Biomechanical lesion, unspecified: Secondary | ICD-10-CM | POA: Diagnosis not present

## 2012-03-01 DIAGNOSIS — M999 Biomechanical lesion, unspecified: Secondary | ICD-10-CM | POA: Diagnosis not present

## 2012-03-01 DIAGNOSIS — M5030 Other cervical disc degeneration, unspecified cervical region: Secondary | ICD-10-CM | POA: Diagnosis not present

## 2012-03-01 DIAGNOSIS — M9981 Other biomechanical lesions of cervical region: Secondary | ICD-10-CM | POA: Diagnosis not present

## 2012-03-01 DIAGNOSIS — M5137 Other intervertebral disc degeneration, lumbosacral region: Secondary | ICD-10-CM | POA: Diagnosis not present

## 2012-03-04 DIAGNOSIS — M5030 Other cervical disc degeneration, unspecified cervical region: Secondary | ICD-10-CM | POA: Diagnosis not present

## 2012-03-04 DIAGNOSIS — M9981 Other biomechanical lesions of cervical region: Secondary | ICD-10-CM | POA: Diagnosis not present

## 2012-03-04 DIAGNOSIS — M999 Biomechanical lesion, unspecified: Secondary | ICD-10-CM | POA: Diagnosis not present

## 2012-03-04 DIAGNOSIS — M5137 Other intervertebral disc degeneration, lumbosacral region: Secondary | ICD-10-CM | POA: Diagnosis not present

## 2012-03-15 ENCOUNTER — Other Ambulatory Visit: Payer: Self-pay | Admitting: Nurse Practitioner

## 2012-03-15 MED ORDER — METOPROLOL TARTRATE 25 MG PO TABS: 12.5000 mg | ORAL_TABLET | Freq: Two times a day (BID) | ORAL | Status: DC

## 2012-03-25 ENCOUNTER — Ambulatory Visit (INDEPENDENT_AMBULATORY_CARE_PROVIDER_SITE_OTHER): Payer: Medicare Other

## 2012-03-25 DIAGNOSIS — Z7901 Long term (current) use of anticoagulants: Secondary | ICD-10-CM | POA: Diagnosis not present

## 2012-03-25 DIAGNOSIS — I4891 Unspecified atrial fibrillation: Secondary | ICD-10-CM

## 2012-03-25 DIAGNOSIS — I480 Paroxysmal atrial fibrillation: Secondary | ICD-10-CM

## 2012-04-05 ENCOUNTER — Ambulatory Visit (INDEPENDENT_AMBULATORY_CARE_PROVIDER_SITE_OTHER): Payer: Medicare Other

## 2012-04-05 DIAGNOSIS — Z7901 Long term (current) use of anticoagulants: Secondary | ICD-10-CM

## 2012-04-05 DIAGNOSIS — I4891 Unspecified atrial fibrillation: Secondary | ICD-10-CM

## 2012-04-05 DIAGNOSIS — I480 Paroxysmal atrial fibrillation: Secondary | ICD-10-CM

## 2012-04-05 LAB — POCT INR: INR: 2.6

## 2012-04-17 DIAGNOSIS — I7300 Raynaud's syndrome without gangrene: Secondary | ICD-10-CM | POA: Diagnosis not present

## 2012-04-17 DIAGNOSIS — I10 Essential (primary) hypertension: Secondary | ICD-10-CM | POA: Diagnosis not present

## 2012-04-17 DIAGNOSIS — R413 Other amnesia: Secondary | ICD-10-CM | POA: Diagnosis not present

## 2012-04-27 ENCOUNTER — Other Ambulatory Visit: Payer: Self-pay

## 2012-04-29 ENCOUNTER — Ambulatory Visit (INDEPENDENT_AMBULATORY_CARE_PROVIDER_SITE_OTHER): Payer: Medicare Other | Admitting: Pharmacist

## 2012-04-29 DIAGNOSIS — Z7901 Long term (current) use of anticoagulants: Secondary | ICD-10-CM

## 2012-04-29 DIAGNOSIS — I4891 Unspecified atrial fibrillation: Secondary | ICD-10-CM

## 2012-04-29 DIAGNOSIS — I480 Paroxysmal atrial fibrillation: Secondary | ICD-10-CM

## 2012-04-29 LAB — POCT INR: INR: 3.4

## 2012-05-15 DIAGNOSIS — I10 Essential (primary) hypertension: Secondary | ICD-10-CM | POA: Diagnosis not present

## 2012-05-15 DIAGNOSIS — R413 Other amnesia: Secondary | ICD-10-CM | POA: Diagnosis not present

## 2012-05-20 ENCOUNTER — Ambulatory Visit (INDEPENDENT_AMBULATORY_CARE_PROVIDER_SITE_OTHER): Payer: Medicare Other

## 2012-05-20 DIAGNOSIS — Z7901 Long term (current) use of anticoagulants: Secondary | ICD-10-CM | POA: Diagnosis not present

## 2012-05-20 DIAGNOSIS — I480 Paroxysmal atrial fibrillation: Secondary | ICD-10-CM

## 2012-05-20 DIAGNOSIS — I4891 Unspecified atrial fibrillation: Secondary | ICD-10-CM

## 2012-05-20 LAB — POCT INR: INR: 2.5

## 2012-05-28 ENCOUNTER — Other Ambulatory Visit: Payer: Self-pay | Admitting: *Deleted

## 2012-05-28 MED ORDER — WARFARIN SODIUM 5 MG PO TABS: ORAL_TABLET | ORAL | Status: DC

## 2012-05-31 ENCOUNTER — Other Ambulatory Visit: Payer: Self-pay

## 2012-05-31 ENCOUNTER — Telehealth: Payer: Self-pay

## 2012-05-31 MED ORDER — WARFARIN SODIUM 5 MG PO TABS: ORAL_TABLET | ORAL | Status: DC

## 2012-05-31 NOTE — Telephone Encounter (Signed)
Refill sent.

## 2012-06-12 ENCOUNTER — Telehealth: Payer: Self-pay | Admitting: Cardiology

## 2012-06-12 NOTE — Telephone Encounter (Signed)
N/A.  LMTC. 

## 2012-06-12 NOTE — Telephone Encounter (Signed)
New problem   Pt is experiencing elevated BP of  179/77. Pt would like to come in the office today if possible.

## 2012-06-13 NOTE — Telephone Encounter (Signed)
BP readings Mon 3/31=  166/84                      Tues 4/1=  170/64, 164/74                      Wed 4/2= 165/71, 170/69                      Thurs 4/3=145/68  Pt was notified that she needs to wait 1-2 hours after taking her meds before checking her bp.  She is to sit in a chair with her legs on the floor and her arm supported at the level of her heart.  She is to wait 5-10 minutes before checking her bp.  If her bp is elevated, she is to sit another 10 minutes and recheck it.  She agrees.  She will call next week with bp readings.  I also told her to take some deep breaths and relax before checking her bp.

## 2012-06-13 NOTE — Telephone Encounter (Signed)
[name] is reporting elevated bp for about 3 days.  She states she is under a lot of stress.  She denies chest pain, headache, sob or pain anywhere.  She states she has has some slight dizziness off and on.  She states she is also scared and shaky because of her bp but is feeling better this am.  She states she is taking Metoprolol for her bp and has been taking it every morning around 8-8:30am.  She is unable to find a record of her bp readings but will call back when she finds it.

## 2012-06-17 ENCOUNTER — Ambulatory Visit (INDEPENDENT_AMBULATORY_CARE_PROVIDER_SITE_OTHER): Payer: Medicare Other | Admitting: *Deleted

## 2012-06-17 DIAGNOSIS — I480 Paroxysmal atrial fibrillation: Secondary | ICD-10-CM

## 2012-06-17 DIAGNOSIS — I4891 Unspecified atrial fibrillation: Secondary | ICD-10-CM

## 2012-06-17 DIAGNOSIS — Z7901 Long term (current) use of anticoagulants: Secondary | ICD-10-CM | POA: Diagnosis not present

## 2012-06-19 ENCOUNTER — Telehealth: Payer: Self-pay | Admitting: Cardiology

## 2012-06-19 DIAGNOSIS — Z1231 Encounter for screening mammogram for malignant neoplasm of breast: Secondary | ICD-10-CM | POA: Diagnosis not present

## 2012-06-19 NOTE — Telephone Encounter (Signed)
F/u   Pt still having problems with her b/p and wants to know if she can come in today

## 2012-06-19 NOTE — Telephone Encounter (Signed)
Pt concerned because her bp is running: 170/64, 180/72, 159/70, 180/72, 164/68, 145/63, 170/98, 180/72, 179/71.  Pt is complaining of a "body lunge" or "spasm" which scares her.  She states she is taking Metoprolol 12.5 mg bid.

## 2012-06-19 NOTE — Telephone Encounter (Signed)
Pt lost her record of blood pressures and will call back when she finds it.

## 2012-06-19 NOTE — Telephone Encounter (Signed)
New problem   Pt was returning your call. Please call pt

## 2012-06-20 NOTE — Telephone Encounter (Signed)
Pt was notified and agrees. 

## 2012-06-20 NOTE — Telephone Encounter (Signed)
Increase metoprolol to 25 twice a day and have her see her primary doctor.

## 2012-06-21 DIAGNOSIS — I10 Essential (primary) hypertension: Secondary | ICD-10-CM | POA: Diagnosis not present

## 2012-06-21 DIAGNOSIS — I4891 Unspecified atrial fibrillation: Secondary | ICD-10-CM | POA: Diagnosis not present

## 2012-06-26 ENCOUNTER — Ambulatory Visit (INDEPENDENT_AMBULATORY_CARE_PROVIDER_SITE_OTHER): Payer: Medicare Other | Admitting: *Deleted

## 2012-06-26 DIAGNOSIS — Z7901 Long term (current) use of anticoagulants: Secondary | ICD-10-CM

## 2012-06-26 DIAGNOSIS — I4891 Unspecified atrial fibrillation: Secondary | ICD-10-CM | POA: Diagnosis not present

## 2012-06-26 DIAGNOSIS — I480 Paroxysmal atrial fibrillation: Secondary | ICD-10-CM

## 2012-07-05 DIAGNOSIS — I4891 Unspecified atrial fibrillation: Secondary | ICD-10-CM | POA: Diagnosis not present

## 2012-07-05 DIAGNOSIS — I10 Essential (primary) hypertension: Secondary | ICD-10-CM | POA: Diagnosis not present

## 2012-07-15 ENCOUNTER — Ambulatory Visit (INDEPENDENT_AMBULATORY_CARE_PROVIDER_SITE_OTHER): Payer: Medicare Other

## 2012-07-15 DIAGNOSIS — Z7901 Long term (current) use of anticoagulants: Secondary | ICD-10-CM | POA: Diagnosis not present

## 2012-07-15 DIAGNOSIS — I4891 Unspecified atrial fibrillation: Secondary | ICD-10-CM | POA: Diagnosis not present

## 2012-07-15 DIAGNOSIS — I480 Paroxysmal atrial fibrillation: Secondary | ICD-10-CM

## 2012-07-15 LAB — POCT INR: INR: 1.6

## 2012-07-18 DIAGNOSIS — L439 Lichen planus, unspecified: Secondary | ICD-10-CM | POA: Diagnosis not present

## 2012-07-18 DIAGNOSIS — D1801 Hemangioma of skin and subcutaneous tissue: Secondary | ICD-10-CM | POA: Diagnosis not present

## 2012-07-18 DIAGNOSIS — L819 Disorder of pigmentation, unspecified: Secondary | ICD-10-CM | POA: Diagnosis not present

## 2012-07-18 DIAGNOSIS — D239 Other benign neoplasm of skin, unspecified: Secondary | ICD-10-CM | POA: Diagnosis not present

## 2012-07-18 DIAGNOSIS — L821 Other seborrheic keratosis: Secondary | ICD-10-CM | POA: Diagnosis not present

## 2012-07-23 DIAGNOSIS — M5137 Other intervertebral disc degeneration, lumbosacral region: Secondary | ICD-10-CM | POA: Diagnosis not present

## 2012-07-23 DIAGNOSIS — M9981 Other biomechanical lesions of cervical region: Secondary | ICD-10-CM | POA: Diagnosis not present

## 2012-07-23 DIAGNOSIS — M999 Biomechanical lesion, unspecified: Secondary | ICD-10-CM | POA: Diagnosis not present

## 2012-07-29 ENCOUNTER — Ambulatory Visit (INDEPENDENT_AMBULATORY_CARE_PROVIDER_SITE_OTHER): Payer: Medicare Other

## 2012-07-29 DIAGNOSIS — M999 Biomechanical lesion, unspecified: Secondary | ICD-10-CM | POA: Diagnosis not present

## 2012-07-29 DIAGNOSIS — I4891 Unspecified atrial fibrillation: Secondary | ICD-10-CM | POA: Diagnosis not present

## 2012-07-29 DIAGNOSIS — Z7901 Long term (current) use of anticoagulants: Secondary | ICD-10-CM | POA: Diagnosis not present

## 2012-07-29 DIAGNOSIS — M5137 Other intervertebral disc degeneration, lumbosacral region: Secondary | ICD-10-CM | POA: Diagnosis not present

## 2012-07-29 DIAGNOSIS — M9981 Other biomechanical lesions of cervical region: Secondary | ICD-10-CM | POA: Diagnosis not present

## 2012-07-29 DIAGNOSIS — I480 Paroxysmal atrial fibrillation: Secondary | ICD-10-CM

## 2012-08-06 DIAGNOSIS — M999 Biomechanical lesion, unspecified: Secondary | ICD-10-CM | POA: Diagnosis not present

## 2012-08-06 DIAGNOSIS — M5137 Other intervertebral disc degeneration, lumbosacral region: Secondary | ICD-10-CM | POA: Diagnosis not present

## 2012-08-06 DIAGNOSIS — M9981 Other biomechanical lesions of cervical region: Secondary | ICD-10-CM | POA: Diagnosis not present

## 2012-08-07 DIAGNOSIS — Z01419 Encounter for gynecological examination (general) (routine) without abnormal findings: Secondary | ICD-10-CM | POA: Diagnosis not present

## 2012-08-07 DIAGNOSIS — M810 Age-related osteoporosis without current pathological fracture: Secondary | ICD-10-CM | POA: Diagnosis not present

## 2012-08-07 DIAGNOSIS — Z124 Encounter for screening for malignant neoplasm of cervix: Secondary | ICD-10-CM | POA: Diagnosis not present

## 2012-08-07 DIAGNOSIS — N952 Postmenopausal atrophic vaginitis: Secondary | ICD-10-CM | POA: Diagnosis not present

## 2012-08-16 DIAGNOSIS — M810 Age-related osteoporosis without current pathological fracture: Secondary | ICD-10-CM | POA: Diagnosis not present

## 2012-08-19 ENCOUNTER — Ambulatory Visit (INDEPENDENT_AMBULATORY_CARE_PROVIDER_SITE_OTHER): Payer: Medicare Other | Admitting: *Deleted

## 2012-08-19 DIAGNOSIS — I4891 Unspecified atrial fibrillation: Secondary | ICD-10-CM | POA: Diagnosis not present

## 2012-08-19 DIAGNOSIS — I480 Paroxysmal atrial fibrillation: Secondary | ICD-10-CM

## 2012-08-19 DIAGNOSIS — Z7901 Long term (current) use of anticoagulants: Secondary | ICD-10-CM | POA: Diagnosis not present

## 2012-09-02 ENCOUNTER — Ambulatory Visit (INDEPENDENT_AMBULATORY_CARE_PROVIDER_SITE_OTHER): Payer: Medicare Other | Admitting: *Deleted

## 2012-09-02 DIAGNOSIS — I4891 Unspecified atrial fibrillation: Secondary | ICD-10-CM | POA: Diagnosis not present

## 2012-09-02 DIAGNOSIS — I480 Paroxysmal atrial fibrillation: Secondary | ICD-10-CM

## 2012-09-02 DIAGNOSIS — Z7901 Long term (current) use of anticoagulants: Secondary | ICD-10-CM | POA: Diagnosis not present

## 2012-09-02 LAB — POCT INR: INR: 4.1

## 2012-09-11 ENCOUNTER — Ambulatory Visit (INDEPENDENT_AMBULATORY_CARE_PROVIDER_SITE_OTHER): Payer: Medicare Other | Admitting: Family Medicine

## 2012-09-11 VITALS — BP 148/86 | HR 65 | Temp 98.3°F | Resp 16 | Ht 64.0 in | Wt 98.4 lb

## 2012-09-11 DIAGNOSIS — I4891 Unspecified atrial fibrillation: Secondary | ICD-10-CM | POA: Diagnosis not present

## 2012-09-11 DIAGNOSIS — I10 Essential (primary) hypertension: Secondary | ICD-10-CM | POA: Diagnosis not present

## 2012-09-11 DIAGNOSIS — R42 Dizziness and giddiness: Secondary | ICD-10-CM | POA: Diagnosis not present

## 2012-09-11 NOTE — Progress Notes (Signed)
Dizziness and elevated blood pressure today.  Feels a little out of it, but did all ADL's and went out to lunch and then farmer's market.  She took an extra 1/2 metoprolol earlier today.  (Is taking 1 full tablet bid usually)  PMHx: h/o atrial fib, anticoagulation,   Objective:  144/86  Neck:  Normal veins, supple, no bruit Chest:  Clear Heart:  Irreg, irreg but average rate is around 70 Extrem:  No edema  Assessment:  I don't see any red flags  Plan:  Continue current meds.  Call if symptoms continue, I expect symptoms to resolve over night.   Signed, [name]

## 2012-09-16 ENCOUNTER — Ambulatory Visit (INDEPENDENT_AMBULATORY_CARE_PROVIDER_SITE_OTHER): Payer: Medicare Other | Admitting: *Deleted

## 2012-09-16 DIAGNOSIS — I480 Paroxysmal atrial fibrillation: Secondary | ICD-10-CM

## 2012-09-16 DIAGNOSIS — Z7901 Long term (current) use of anticoagulants: Secondary | ICD-10-CM

## 2012-09-16 DIAGNOSIS — I4891 Unspecified atrial fibrillation: Secondary | ICD-10-CM

## 2012-09-16 LAB — POCT INR: INR: 2.2

## 2012-09-18 DIAGNOSIS — I4891 Unspecified atrial fibrillation: Secondary | ICD-10-CM | POA: Diagnosis not present

## 2012-09-18 DIAGNOSIS — J309 Allergic rhinitis, unspecified: Secondary | ICD-10-CM | POA: Diagnosis not present

## 2012-09-18 DIAGNOSIS — I10 Essential (primary) hypertension: Secondary | ICD-10-CM | POA: Diagnosis not present

## 2012-09-18 DIAGNOSIS — F411 Generalized anxiety disorder: Secondary | ICD-10-CM | POA: Diagnosis not present

## 2012-09-18 DIAGNOSIS — Z0000 Encounter for general adult medical examination without abnormal findings: Secondary | ICD-10-CM | POA: Diagnosis not present

## 2012-09-18 DIAGNOSIS — D126 Benign neoplasm of colon, unspecified: Secondary | ICD-10-CM | POA: Diagnosis not present

## 2012-09-22 ENCOUNTER — Ambulatory Visit (INDEPENDENT_AMBULATORY_CARE_PROVIDER_SITE_OTHER): Payer: Medicare Other | Admitting: Emergency Medicine

## 2012-09-22 VITALS — BP 181/70 | HR 60 | Temp 98.1°F | Resp 16 | Ht 63.0 in | Wt 98.2 lb

## 2012-09-22 DIAGNOSIS — W57XXXA Bitten or stung by nonvenomous insect and other nonvenomous arthropods, initial encounter: Secondary | ICD-10-CM | POA: Diagnosis not present

## 2012-09-22 DIAGNOSIS — S30860A Insect bite (nonvenomous) of lower back and pelvis, initial encounter: Secondary | ICD-10-CM

## 2012-09-22 DIAGNOSIS — T148 Other injury of unspecified body region: Secondary | ICD-10-CM | POA: Diagnosis not present

## 2012-09-22 DIAGNOSIS — L0390 Cellulitis, unspecified: Secondary | ICD-10-CM | POA: Diagnosis not present

## 2012-09-22 DIAGNOSIS — L0291 Cutaneous abscess, unspecified: Secondary | ICD-10-CM | POA: Diagnosis not present

## 2012-09-22 MED ORDER — SULFAMETHOXAZOLE-TRIMETHOPRIM 800-160 MG PO TABS: 1.0000 | ORAL_TABLET | Freq: Two times a day (BID) | ORAL | Status: DC

## 2012-09-22 MED ORDER — TRIAMCINOLONE ACETONIDE 0.1 % EX CREA: TOPICAL_CREAM | Freq: Two times a day (BID) | CUTANEOUS | Status: DC

## 2012-09-22 NOTE — Patient Instructions (Addendum)
Insect Bite  Mosquitoes, flies, fleas, bedbugs, and many other insects can bite. Insect bites are different from insect stings. A sting is when venom is injected into the skin. Some insect bites can transmit infectious diseases.  SYMPTOMS   Insect bites usually turn red, swell, and itch for 2 to 4 days. They often go away on their own.  TREATMENT   Your caregiver may prescribe antibiotic medicines if a bacterial infection develops in the bite.  HOME CARE INSTRUCTIONS   Do not scratch the bite area.   Keep the bite area clean and dry. Wash the bite area thoroughly with soap and water.   Put ice or cool compresses on the bite area.   Put ice in a plastic bag.   Place a towel between your skin and the bag.   Leave the ice on for 20 minutes, 4 times a day for the first 2 to 3 days, or as directed.   You may apply a baking soda paste, cortisone cream, or calamine lotion to the bite area as directed by your caregiver. This can help reduce itching and swelling.   Only take over-the-counter or prescription medicines as directed by your caregiver.   If you are given antibiotics, take them as directed. Finish them even if you start to feel better.  You may need a tetanus shot if:   You cannot remember when you had your last tetanus shot.   You have never had a tetanus shot.   The injury broke your skin.  If you get a tetanus shot, your arm may swell, get red, and feel warm to the touch. This is common and not a problem. If you need a tetanus shot and you choose not to have one, there is a rare chance of getting tetanus. Sickness from tetanus can be serious.  SEEK IMMEDIATE MEDICAL CARE IF:    You have increased pain, redness, or swelling in the bite area.   You see a red line on the skin coming from the bite.   You have a fever.   You have joint pain.   You have a headache or neck pain.   You have unusual weakness.   You have a rash.   You have chest pain or shortness of breath.    You have abdominal pain, nausea, or vomiting.   You feel unusually tired or sleepy.  MAKE SURE YOU:    Understand these instructions.   Will watch your condition.   Will get help right away if you are not doing well or get worse.  Document Released: 04/06/2004 Document Revised: 05/22/2011 Document Reviewed: 09/28/2010  [employer] Patient Information 2014 [employer].

## 2012-09-22 NOTE — Progress Notes (Signed)
[facility] [address] [place] [address] [phone]  Date:  09/22/2012   Name:  [name]   DOB:  03-22-1933   MRN:  [id number]  PCP:  Ginette Otto, MD    Chief Complaint: Insect Bite   History of Present Illness:  [name] is a 79 y.o. very pleasant female patient who presents with the following:  [place] and was bitten repeatedly by insects on her exposed lower extremities.  She is not sure of the flying insect that bit her.  The lesions were painful and rarely itchy.  They have increased in size.  Her husband was bitten as well and has resolved.  No improvement with over the counter medications or other home remedies. Denies other complaint or health concern today.   Patient Active Problem List   Diagnosis Date Noted  . Palpitations   . Osteopenia   . Chest pain 04/07/2011  . Spinal stenosis   . Anxiety   . Warfarin anticoagulation   . GERD (gastroesophageal reflux disease)   . Osteoporosis   . Paroxysmal atrial fibrillation   . Hypertension   . Hyperlipidemia   . Carotid artery disease   . CAD (coronary artery disease)   . Ejection fraction     Past Medical History  Diagnosis Date  . Spinal stenosis   . Anxiety   . GERD (gastroesophageal reflux disease)   . Osteoporosis   . Paroxysmal atrial fibrillation     Coumadin therapy  . Hypertension   . Hyperlipidemia   . CAD (coronary artery disease) 2008/2010    Catheterization 2008, 40% LAD after first diagonal  /  nuclear January, 2010 normal  . Carotid artery disease     Doppler, July, 2011, 0-39% bilateral  . Endometrial polyp   . Atrophic vaginitis   . Vitamin D deficiency   . Stress   . Normal nuclear stress test Feb 2013    No ischemia. Normal wall motion. EF greater than 70%  . Osteopenia   . Palpitations     Monitor, May, 2013, no atrial fib,  one short run of PSVT    Past Surgical History  Procedure Laterality Date  .  Laminectomy  March 2009    L4 through L5  . Cataract surgery  2009  . Tonsillectomy    . Hysteroscopy  02/07/2005    HYST W D&C, DX: PMB W SMALL ENDO POLYP-SURGEON [name]  . Breast surgery      BENIGN BREAST LUMP  . Excision morton's neuroma  2000  . Hip surgery  2005  . Dilation and curettage of uterus      History  Substance Use Topics  . Smoking status: Never Smoker   . Smokeless tobacco: Never Used  . Alcohol Use: Yes     Comment: rare    Family History  Problem Relation Age of Onset  . Transient ischemic attack Mother   . Stroke Mother   . Cancer Father     PROSTATE  . Pancreatitis Father   . Stroke Sister   . Spina bifida Brother     No Known Allergies  Medication list has been reviewed and updated.  Current Outpatient Prescriptions on File Prior to Visit  Medication Sig Dispense Refill  . metoprolol tartrate (LOPRESSOR) 25 MG tablet Take 0.5 tablets (12.5 mg total) by mouth 2 (two) times daily.  60 tablet  5  . Multiple Vitamins-Minerals (OCUVITE PRESERVISION) TABS  Take 1 each by mouth daily.        . Risedronate Sodium (ATELVIA) 35 MG TBEC Take 1 tablet by mouth once a week.      . simvastatin (ZOCOR) 20 MG tablet Take 1 tablet (20 mg total) by mouth daily.  30 tablet  9  . Vitamin D, Ergocalciferol, (DRISDOL) 50000 UNITS CAPS take 1 capsule by mouth EVERY OTHER WEEK  10 capsule  3  . warfarin (COUMADIN) 5 MG tablet Take as directed by coumadinclinic. Take one pill every day except Monday then just take half.      . [DISCONTINUED] estradiol (ESTRACE) 1 MG tablet Take 1 mg by mouth as needed.        No current facility-administered medications on file prior to visit.    Review of Systems:  As per HPI, otherwise negative.    Physical Examination: Filed Vitals:   09/22/12 1702  BP: 181/70  Pulse: 60  Temp: 98.1 F (36.7 C)  Resp: 16   Filed Vitals:   09/22/12 1702  Height: 5\' 3"  (1.6 m)  Weight: 98 lb 3.2 oz (44.543 kg)   Body mass index is  17.4 kg/(m^2). Ideal Body Weight: Weight in (lb) to have BMI = 25: 140.8   GEN: WDWN, NAD, Non-toxic, Alert & Oriented x 3 HEENT: Atraumatic, Normocephalic.  Ears and Nose: No external deformity. EXTR: No clubbing/cyanosis/edema NEURO: Normal gait.  PSYCH: Normally interactive. Conversant. Not depressed or anxious appearing.  Calm demeanor.  SKIN:  Multiple insect bites on lower extremities.  Has a larger painful lesion on the intergluteal cleft.  Assessment and Plan: Insect bites Tac Cellulitis Septra   Signed,  Phillips Odor, MD

## 2012-10-04 DIAGNOSIS — H524 Presbyopia: Secondary | ICD-10-CM | POA: Diagnosis not present

## 2012-10-04 DIAGNOSIS — H52209 Unspecified astigmatism, unspecified eye: Secondary | ICD-10-CM | POA: Diagnosis not present

## 2012-10-04 DIAGNOSIS — H3530 Unspecified macular degeneration: Secondary | ICD-10-CM | POA: Diagnosis not present

## 2012-10-04 DIAGNOSIS — Z961 Presence of intraocular lens: Secondary | ICD-10-CM | POA: Diagnosis not present

## 2012-10-07 ENCOUNTER — Ambulatory Visit (INDEPENDENT_AMBULATORY_CARE_PROVIDER_SITE_OTHER): Payer: Medicare Other | Admitting: *Deleted

## 2012-10-07 DIAGNOSIS — Z7901 Long term (current) use of anticoagulants: Secondary | ICD-10-CM | POA: Diagnosis not present

## 2012-10-07 DIAGNOSIS — M810 Age-related osteoporosis without current pathological fracture: Secondary | ICD-10-CM | POA: Diagnosis not present

## 2012-10-07 DIAGNOSIS — I4891 Unspecified atrial fibrillation: Secondary | ICD-10-CM

## 2012-10-07 DIAGNOSIS — I480 Paroxysmal atrial fibrillation: Secondary | ICD-10-CM

## 2012-10-09 ENCOUNTER — Other Ambulatory Visit: Payer: Self-pay | Admitting: Obstetrics and Gynecology

## 2012-10-16 ENCOUNTER — Other Ambulatory Visit: Payer: Self-pay

## 2012-10-22 DIAGNOSIS — N952 Postmenopausal atrophic vaginitis: Secondary | ICD-10-CM | POA: Diagnosis not present

## 2012-10-28 DIAGNOSIS — H9209 Otalgia, unspecified ear: Secondary | ICD-10-CM | POA: Diagnosis not present

## 2012-10-28 DIAGNOSIS — K219 Gastro-esophageal reflux disease without esophagitis: Secondary | ICD-10-CM | POA: Diagnosis not present

## 2012-10-28 DIAGNOSIS — I10 Essential (primary) hypertension: Secondary | ICD-10-CM | POA: Diagnosis not present

## 2012-11-04 ENCOUNTER — Ambulatory Visit (INDEPENDENT_AMBULATORY_CARE_PROVIDER_SITE_OTHER): Payer: Medicare Other | Admitting: *Deleted

## 2012-11-04 DIAGNOSIS — Z7901 Long term (current) use of anticoagulants: Secondary | ICD-10-CM

## 2012-11-04 DIAGNOSIS — I480 Paroxysmal atrial fibrillation: Secondary | ICD-10-CM

## 2012-11-04 DIAGNOSIS — I4891 Unspecified atrial fibrillation: Secondary | ICD-10-CM | POA: Diagnosis not present

## 2012-11-04 LAB — POCT INR: INR: 5

## 2012-11-14 ENCOUNTER — Ambulatory Visit (INDEPENDENT_AMBULATORY_CARE_PROVIDER_SITE_OTHER): Payer: Medicare Other | Admitting: *Deleted

## 2012-11-14 DIAGNOSIS — I4891 Unspecified atrial fibrillation: Secondary | ICD-10-CM | POA: Diagnosis not present

## 2012-11-14 DIAGNOSIS — Z7901 Long term (current) use of anticoagulants: Secondary | ICD-10-CM

## 2012-11-14 DIAGNOSIS — I480 Paroxysmal atrial fibrillation: Secondary | ICD-10-CM

## 2012-11-14 LAB — POCT INR: INR: 2.3

## 2012-11-26 DIAGNOSIS — K219 Gastro-esophageal reflux disease without esophagitis: Secondary | ICD-10-CM | POA: Diagnosis not present

## 2012-11-26 DIAGNOSIS — R490 Dysphonia: Secondary | ICD-10-CM | POA: Diagnosis not present

## 2012-11-28 ENCOUNTER — Ambulatory Visit (INDEPENDENT_AMBULATORY_CARE_PROVIDER_SITE_OTHER): Payer: Medicare Other | Admitting: *Deleted

## 2012-11-28 DIAGNOSIS — I4891 Unspecified atrial fibrillation: Secondary | ICD-10-CM | POA: Diagnosis not present

## 2012-11-28 DIAGNOSIS — Z7901 Long term (current) use of anticoagulants: Secondary | ICD-10-CM | POA: Diagnosis not present

## 2012-11-28 DIAGNOSIS — I480 Paroxysmal atrial fibrillation: Secondary | ICD-10-CM

## 2012-11-28 LAB — PROTIME-INR: INR: 7.1 ratio (ref 0.8–1.0)

## 2012-11-28 LAB — POCT INR: INR: 6.3

## 2012-12-06 ENCOUNTER — Ambulatory Visit (INDEPENDENT_AMBULATORY_CARE_PROVIDER_SITE_OTHER): Payer: Medicare Other

## 2012-12-06 DIAGNOSIS — I480 Paroxysmal atrial fibrillation: Secondary | ICD-10-CM

## 2012-12-06 DIAGNOSIS — I4891 Unspecified atrial fibrillation: Secondary | ICD-10-CM

## 2012-12-06 DIAGNOSIS — Z7901 Long term (current) use of anticoagulants: Secondary | ICD-10-CM | POA: Diagnosis not present

## 2012-12-09 DIAGNOSIS — K219 Gastro-esophageal reflux disease without esophagitis: Secondary | ICD-10-CM | POA: Diagnosis not present

## 2012-12-09 DIAGNOSIS — Z79899 Other long term (current) drug therapy: Secondary | ICD-10-CM | POA: Diagnosis not present

## 2012-12-09 DIAGNOSIS — I10 Essential (primary) hypertension: Secondary | ICD-10-CM | POA: Diagnosis not present

## 2012-12-09 DIAGNOSIS — I781 Nevus, non-neoplastic: Secondary | ICD-10-CM | POA: Diagnosis not present

## 2012-12-09 DIAGNOSIS — E785 Hyperlipidemia, unspecified: Secondary | ICD-10-CM | POA: Diagnosis not present

## 2012-12-09 IMAGING — CR DG CHEST 2V
2 series · 2 of 2 positions shown · non-contrast
Comparison: Chest x-ray of 04/18/2008

CLINICAL DATA: Mid chest pain radiating to the jaw, hypertension

CHEST - 2 VIEW

[w chest pa]
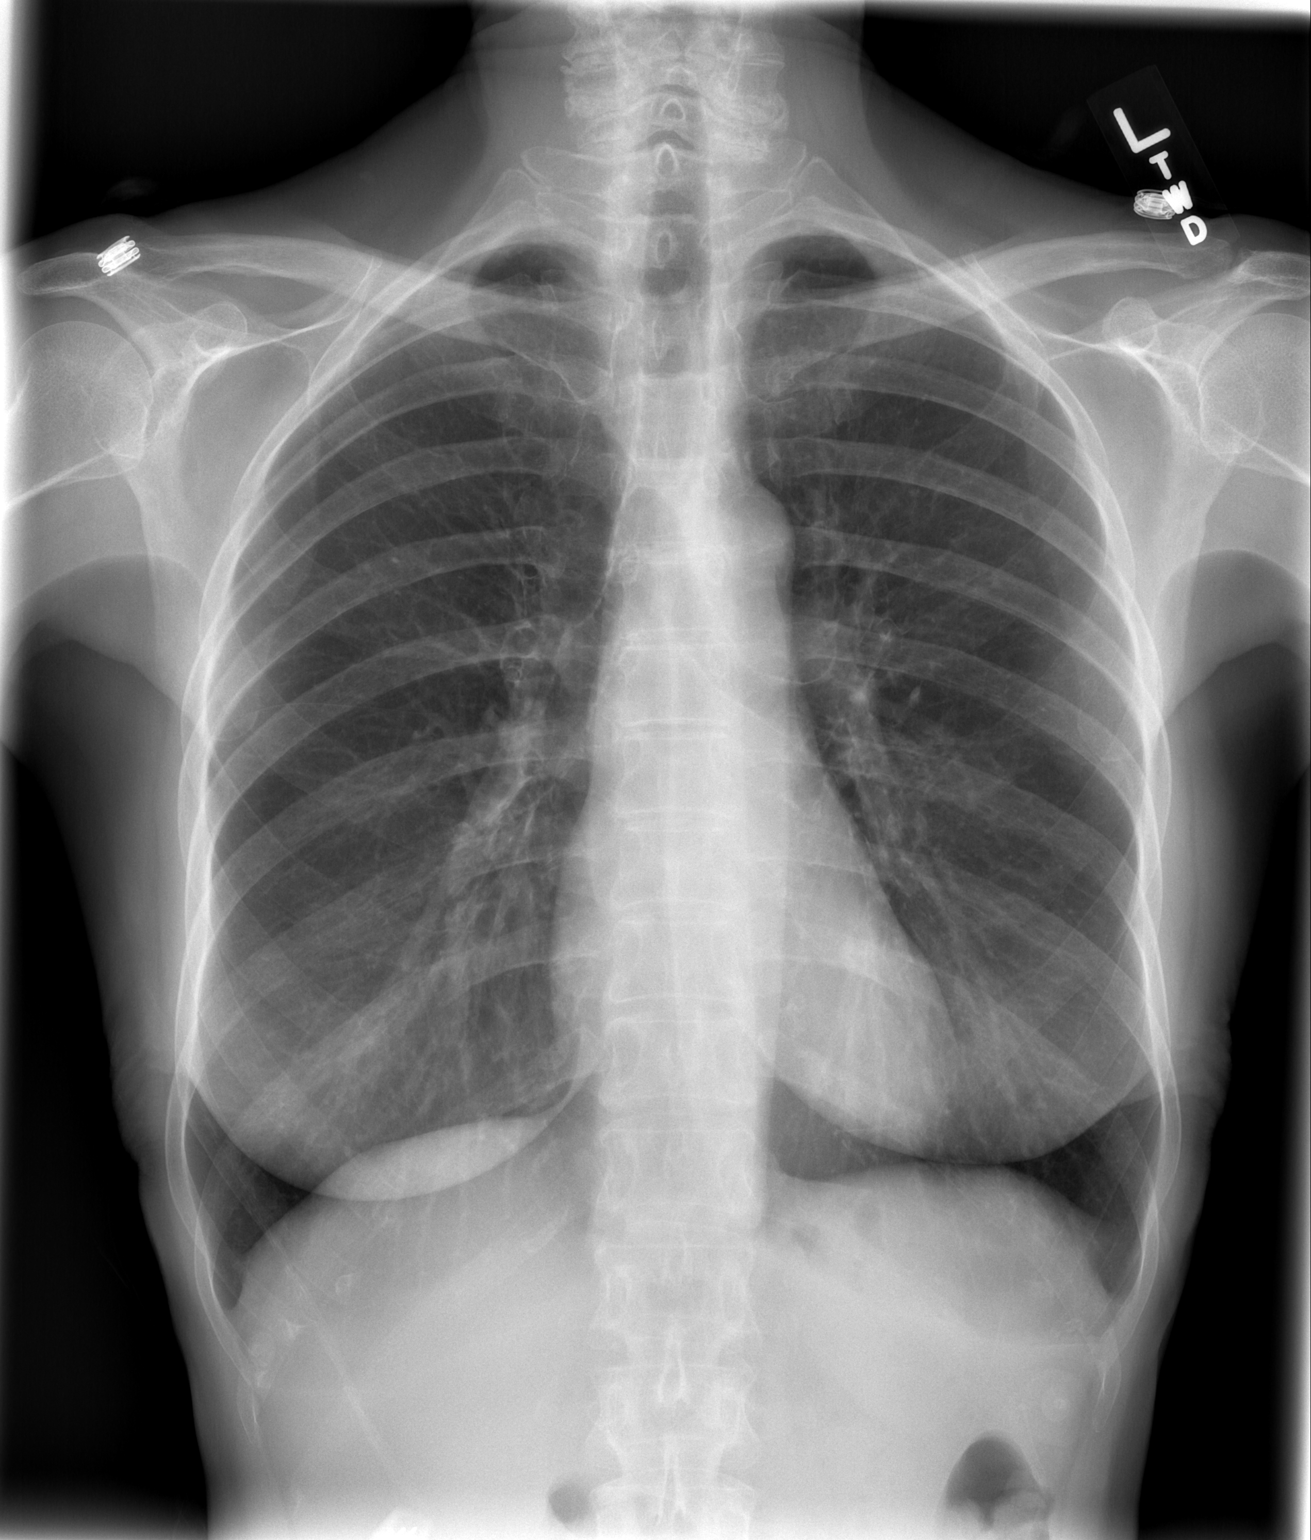

[w chest lat]
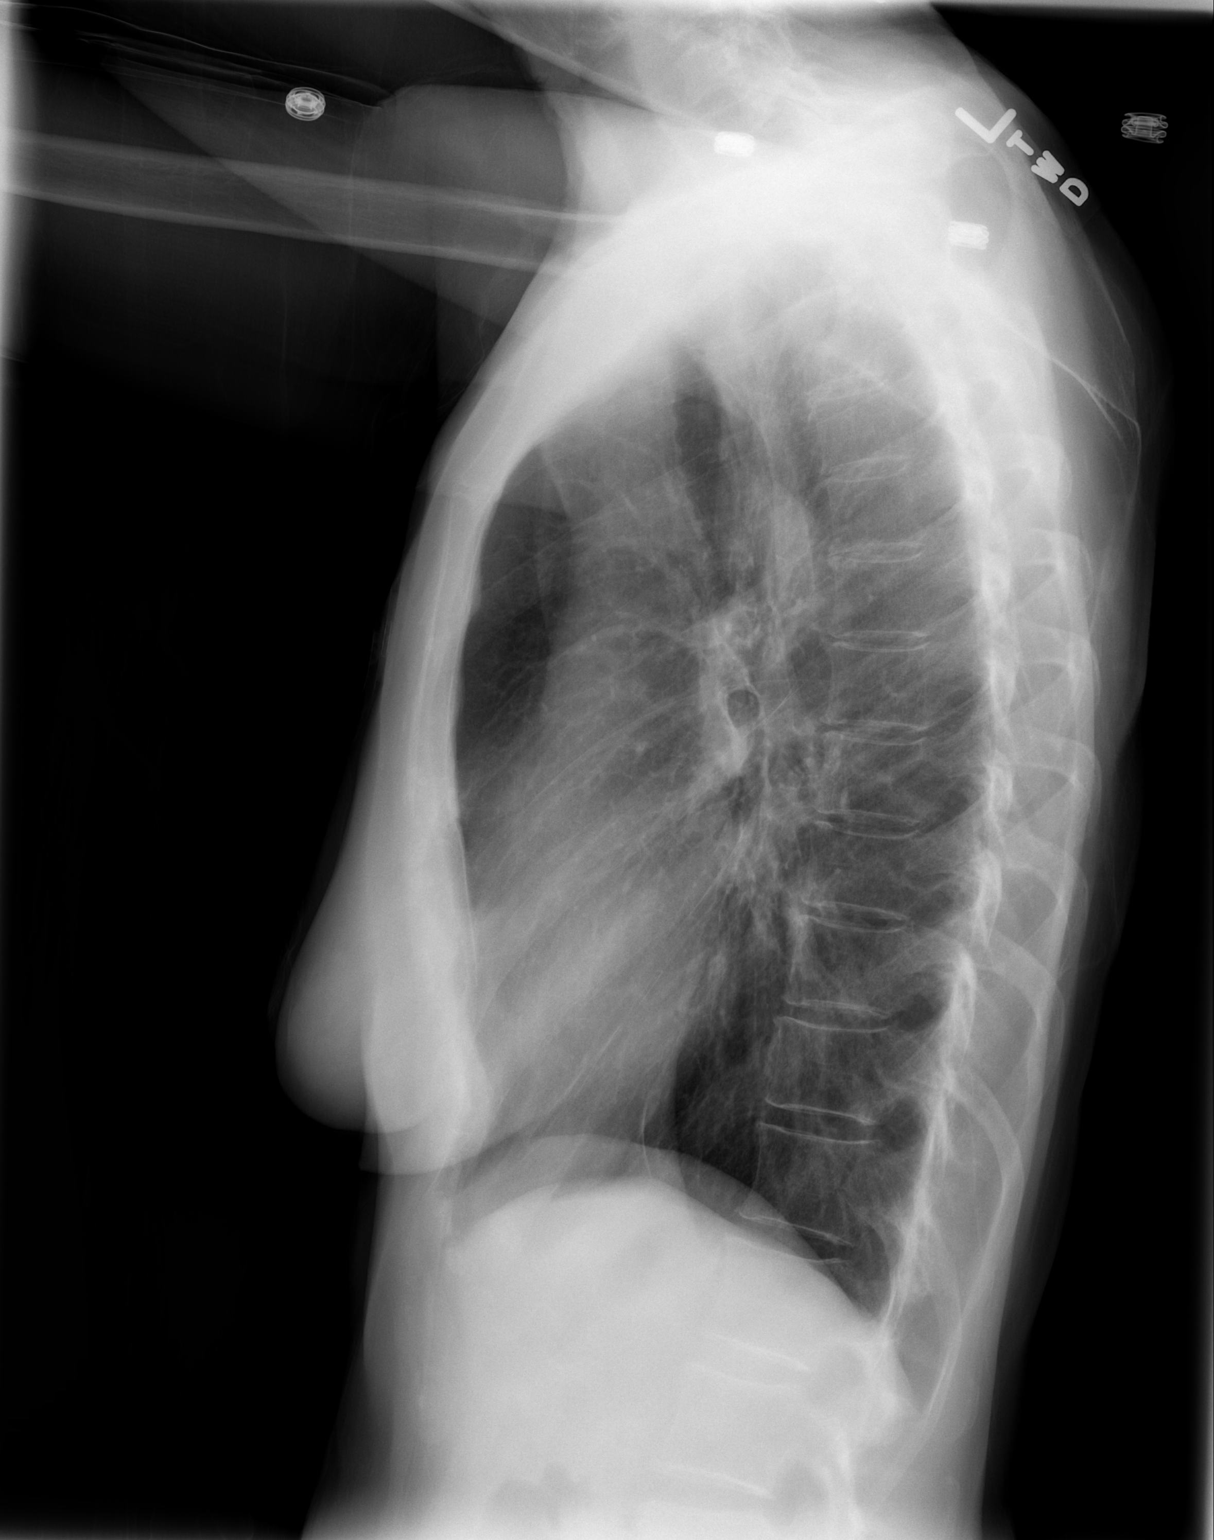

[2 of 2 positions shown; findings below may reference images not displayed]

FINDINGS: The lungs are clear and slightly hyperaerated.
Mediastinal contours appear stable.  The heart is within normal
limits in size.  No bony abnormality is seen.
IMPRESSION: No active lung disease.  Slight hyperaeration.

## 2012-12-10 DIAGNOSIS — Z23 Encounter for immunization: Secondary | ICD-10-CM | POA: Diagnosis not present

## 2012-12-13 ENCOUNTER — Ambulatory Visit (INDEPENDENT_AMBULATORY_CARE_PROVIDER_SITE_OTHER): Payer: Medicare Other | Admitting: *Deleted

## 2012-12-13 DIAGNOSIS — I4891 Unspecified atrial fibrillation: Secondary | ICD-10-CM

## 2012-12-13 DIAGNOSIS — I480 Paroxysmal atrial fibrillation: Secondary | ICD-10-CM

## 2012-12-13 DIAGNOSIS — Z7901 Long term (current) use of anticoagulants: Secondary | ICD-10-CM

## 2012-12-30 ENCOUNTER — Ambulatory Visit (INDEPENDENT_AMBULATORY_CARE_PROVIDER_SITE_OTHER): Payer: Medicare Other | Admitting: *Deleted

## 2012-12-30 DIAGNOSIS — I4891 Unspecified atrial fibrillation: Secondary | ICD-10-CM

## 2012-12-30 DIAGNOSIS — Z7901 Long term (current) use of anticoagulants: Secondary | ICD-10-CM | POA: Diagnosis not present

## 2012-12-30 DIAGNOSIS — I480 Paroxysmal atrial fibrillation: Secondary | ICD-10-CM

## 2013-01-01 ENCOUNTER — Other Ambulatory Visit: Payer: Self-pay | Admitting: Gastroenterology

## 2013-01-13 DIAGNOSIS — M9981 Other biomechanical lesions of cervical region: Secondary | ICD-10-CM | POA: Diagnosis not present

## 2013-01-13 DIAGNOSIS — M5137 Other intervertebral disc degeneration, lumbosacral region: Secondary | ICD-10-CM | POA: Diagnosis not present

## 2013-01-13 DIAGNOSIS — M999 Biomechanical lesion, unspecified: Secondary | ICD-10-CM | POA: Diagnosis not present

## 2013-01-14 ENCOUNTER — Ambulatory Visit (INDEPENDENT_AMBULATORY_CARE_PROVIDER_SITE_OTHER): Payer: Medicare Other | Admitting: General Practice

## 2013-01-14 DIAGNOSIS — I480 Paroxysmal atrial fibrillation: Secondary | ICD-10-CM

## 2013-01-14 DIAGNOSIS — I4891 Unspecified atrial fibrillation: Secondary | ICD-10-CM

## 2013-01-14 DIAGNOSIS — Z7901 Long term (current) use of anticoagulants: Secondary | ICD-10-CM | POA: Diagnosis not present

## 2013-01-14 LAB — POCT INR: INR: 3.7

## 2013-01-20 ENCOUNTER — Encounter (HOSPITAL_COMMUNITY): Payer: Self-pay | Admitting: Pharmacy Technician

## 2013-01-20 ENCOUNTER — Encounter (HOSPITAL_COMMUNITY): Payer: Self-pay | Admitting: *Deleted

## 2013-01-20 DIAGNOSIS — R413 Other amnesia: Secondary | ICD-10-CM

## 2013-01-20 HISTORY — DX: Other amnesia: R41.3

## 2013-01-21 ENCOUNTER — Other Ambulatory Visit: Payer: Self-pay | Admitting: *Deleted

## 2013-01-21 MED ORDER — SIMVASTATIN 20 MG PO TABS: 20.0000 mg | ORAL_TABLET | Freq: Every evening | ORAL | Status: DC

## 2013-01-28 ENCOUNTER — Ambulatory Visit (INDEPENDENT_AMBULATORY_CARE_PROVIDER_SITE_OTHER): Payer: Medicare Other | Admitting: Cardiology

## 2013-01-28 ENCOUNTER — Encounter: Payer: Self-pay | Admitting: Cardiology

## 2013-01-28 ENCOUNTER — Ambulatory Visit (INDEPENDENT_AMBULATORY_CARE_PROVIDER_SITE_OTHER): Payer: Medicare Other | Admitting: *Deleted

## 2013-01-28 VITALS — BP 158/80 | HR 62 | Ht 64.0 in | Wt 126.0 lb

## 2013-01-28 DIAGNOSIS — I2510 Atherosclerotic heart disease of native coronary artery without angina pectoris: Secondary | ICD-10-CM | POA: Diagnosis not present

## 2013-01-28 DIAGNOSIS — E785 Hyperlipidemia, unspecified: Secondary | ICD-10-CM

## 2013-01-28 DIAGNOSIS — I480 Paroxysmal atrial fibrillation: Secondary | ICD-10-CM

## 2013-01-28 DIAGNOSIS — Z7901 Long term (current) use of anticoagulants: Secondary | ICD-10-CM

## 2013-01-28 DIAGNOSIS — I779 Disorder of arteries and arterioles, unspecified: Secondary | ICD-10-CM

## 2013-01-28 DIAGNOSIS — I4891 Unspecified atrial fibrillation: Secondary | ICD-10-CM

## 2013-01-28 DIAGNOSIS — R002 Palpitations: Secondary | ICD-10-CM | POA: Diagnosis not present

## 2013-01-28 LAB — POCT INR: INR: 3.1

## 2013-01-28 NOTE — Assessment & Plan Note (Signed)
Patient has mild disease. Nuclear scan in February, 2013 revealed no ischemia. No further workup.

## 2013-01-28 NOTE — Assessment & Plan Note (Addendum)
The patient is on a statin. Consideration could be given to a higher dose with current guidelines. However I am not convinced that this is the most prudent approach for her at this time.  As part of today's evaluation I spent greater than 25 minutes with her total care. More than half of this time was with direct contact with her. We had a lengthy discussion about the approach to anticoagulation.

## 2013-01-28 NOTE — Assessment & Plan Note (Signed)
She has mild carotid disease. She will need a followup Doppler next year.

## 2013-01-28 NOTE — Progress Notes (Signed)
HPI  Patient is seen today for followup coronary disease and paroxysmal atrial fibrillation. She is on Coumadin. Colonoscopy is being planned. It will be okay for her to hold her Coumadin. She's not having any particular problems. She has only rare limited palpitations.  No Known Allergies  Current Outpatient Prescriptions  Medication Sig Dispense Refill  . Calcium-Magnesium-Vitamin D (CALCIUM 500 PO) Take 1 tablet by mouth daily.      . cholecalciferol (VITAMIN D) 1000 UNITS tablet Take 1,000 Units by mouth daily.      [name] lisinopril (PRINIVIL,ZESTRIL) 5 MG tablet Take 5 mg by mouth every morning.      . metoprolol tartrate (LOPRESSOR) 25 MG tablet Take 25 mg by mouth 2 (two) times daily.      . Multiple Vitamins-Minerals (OCUVITE PRESERVISION) TABS Take 1 each by mouth daily.        . Risedronate Sodium (ATELVIA) 35 MG TBEC Take 1 tablet by mouth once a week.      . simvastatin (ZOCOR) 20 MG tablet Take 1 tablet (20 mg total) by mouth every evening.  30 tablet  0  . warfarin (COUMADIN) 5 MG tablet Take 5 mg by mouth every evening.       . [DISCONTINUED] estradiol (ESTRACE) 1 MG tablet Take 1 mg by mouth as needed.        No current facility-administered medications for this visit.    History   Social History  . Marital Status: Married    Spouse Name: N/A    Number of Children: N/A  . Years of Education: N/A   Occupational History  . Retired    Social History Main Topics  . Smoking status: Never Smoker   . Smokeless tobacco: Never Used  . Alcohol Use: Yes     Comment: very rare  . Drug Use: No  . Sexual Activity: No   Other Topics Concern  . Not on file   Social History Narrative   Retired; Married;      Has had increased stress as her husband had a stroke ~ 3wks ago and she has been taking care of him. (04/07/11)    Family History  Problem Relation Age of Onset  . Transient ischemic attack Mother   . Stroke Mother   . Cancer Father     PROSTATE  .  Pancreatitis Father   . Stroke Sister   . Spina bifida Brother     Past Medical History  Diagnosis Date  . Spinal stenosis   . Anxiety   . GERD (gastroesophageal reflux disease)   . Osteoporosis   . Paroxysmal atrial fibrillation     Coumadin therapy  . Hypertension   . Hyperlipidemia   . CAD (coronary artery disease) 2008/2010    Catheterization 2008, 40% LAD after first diagonal  /  nuclear January, 2010 normal  . Carotid artery disease     Doppler, July, 2011, 0-39% bilateral  . Endometrial polyp   . Atrophic vaginitis   . Vitamin D deficiency   . Stress   . Normal nuclear stress test Feb 2013    No ischemia. Normal wall motion. EF greater than 70%  . Osteopenia   . Palpitations     Monitor, May, 2013, no atrial fib,  one short run of PSVT  . Memory difficulty 01-20-13    "memory issues"    Past Surgical History  Procedure Laterality Date  . Laminectomy  March 2009    L4 through L5  .  Cataract surgery  2009  . Tonsillectomy    . Hysteroscopy  02/07/2005    HYST W D&C, DX: PMB W SMALL ENDO POLYP-SURGEON [name]  . Breast surgery      BENIGN BREAST LUMP  . Excision morton's neuroma  2000  . Hip surgery Right 2005    debridement  . Dilation and curettage of uterus      Patient Active Problem List   Diagnosis Date Noted  . Palpitations   . Osteopenia   . Chest pain 04/07/2011  . Spinal stenosis   . Anxiety   . Warfarin anticoagulation   . GERD (gastroesophageal reflux disease)   . Osteoporosis   . Paroxysmal atrial fibrillation   . Hypertension   . Hyperlipidemia   . Carotid artery disease   . CAD (coronary artery disease)   . Ejection fraction     ROS   Patient denies fever, chills, headache, sweats, rash, change in vision, change in hearing, chest pain, cough, nausea or vomiting, urinary symptoms. All other systems are reviewed and are negative.  PHYSICAL EXAM Patient is oriented to person time and place. Affect is normal. There is no  jugulovenous distention. Lungs are clear. Respiratory effort is nonlabored. Cardiac exam her vitals S1 and S2. There no clicks or significant murmurs. The abdomen is soft. There is no peripheral edema. There no musculoskeletal deformities. There are no skin rashes. Her rhythm is perfectly regular. Filed Vitals:   01/28/13 1446  BP: 158/80  Pulse: 62  Height: 5\' 4"  (1.626 m)  Weight: 126 lb (57.153 kg)    EKG was not done today because rhythm is quite regular on physical exam.  ASSESSMENT & PLAN

## 2013-01-28 NOTE — Assessment & Plan Note (Signed)
She will monitor in May, 2013. She had no atrial fibrillation. She did have one 14 beat run of supraventricular tachycardia that was regular. She's not having any symptoms at this time. No further workup.

## 2013-01-28 NOTE — Assessment & Plan Note (Signed)
She is anticoagulated. She is not having any prolonged symptoms. No further workup.

## 2013-01-28 NOTE — Patient Instructions (Signed)
**Note De-Identified  Obfuscation** Your physician recommends that you continue on your current medications as directed. Please refer to the Current Medication list given to you today. You may hold Coumadin dose as directed by gastroenterology for colonoscopy.  Your physician wants you to follow-up in: 1 year. You will receive a reminder letter in the mail two months in advance. If you don't receive a letter, please call our office to schedule the follow-up appointment.

## 2013-01-28 NOTE — Assessment & Plan Note (Addendum)
Patient has been stable her Coumadin. She can hold her Coumadin for her colonoscopy. She can then start her Coumadin back after the colonoscopy. I had a lengthy discussion with her today about anticoagulation in general. She is on Coumadin. She does have some variation in her labs over time. She asked about new agents. She is definitely a candidate for a new anticoagulant if she decides she would like to take 1. We have not finalize this decision today. However will be safe for her she makes his decision and she can be converted to a new agent.

## 2013-01-31 DIAGNOSIS — M9981 Other biomechanical lesions of cervical region: Secondary | ICD-10-CM | POA: Diagnosis not present

## 2013-01-31 DIAGNOSIS — M5137 Other intervertebral disc degeneration, lumbosacral region: Secondary | ICD-10-CM | POA: Diagnosis not present

## 2013-01-31 DIAGNOSIS — M999 Biomechanical lesion, unspecified: Secondary | ICD-10-CM | POA: Diagnosis not present

## 2013-02-04 DIAGNOSIS — Z86010 Personal history of colonic polyps: Secondary | ICD-10-CM | POA: Diagnosis not present

## 2013-02-04 DIAGNOSIS — M5137 Other intervertebral disc degeneration, lumbosacral region: Secondary | ICD-10-CM | POA: Diagnosis not present

## 2013-02-04 DIAGNOSIS — M9981 Other biomechanical lesions of cervical region: Secondary | ICD-10-CM | POA: Diagnosis not present

## 2013-02-04 DIAGNOSIS — Z1211 Encounter for screening for malignant neoplasm of colon: Secondary | ICD-10-CM | POA: Diagnosis not present

## 2013-02-04 DIAGNOSIS — M999 Biomechanical lesion, unspecified: Secondary | ICD-10-CM | POA: Diagnosis not present

## 2013-02-11 ENCOUNTER — Ambulatory Visit (HOSPITAL_COMMUNITY)
Admission: RE | Admit: 2013-02-11 | Discharge: 2013-02-11 | Disposition: A | Discharge status: Home / Self Care | Payer: Medicare Other | Source: Ambulatory Visit | Attending: Gastroenterology | Admitting: Gastroenterology

## 2013-02-11 ENCOUNTER — Ambulatory Visit (HOSPITAL_COMMUNITY): Payer: Medicare Other | Admitting: Anesthesiology

## 2013-02-11 ENCOUNTER — Encounter (HOSPITAL_COMMUNITY): Payer: Medicare Other | Admitting: Anesthesiology

## 2013-02-11 ENCOUNTER — Encounter (HOSPITAL_COMMUNITY): Payer: Self-pay | Admitting: *Deleted

## 2013-02-11 ENCOUNTER — Encounter (HOSPITAL_COMMUNITY)
Admission: RE | Disposition: A | Discharge status: Home / Self Care | Payer: Self-pay | Source: Ambulatory Visit | Attending: Gastroenterology

## 2013-02-11 DIAGNOSIS — Z09 Encounter for follow-up examination after completed treatment for conditions other than malignant neoplasm: Secondary | ICD-10-CM | POA: Diagnosis not present

## 2013-02-11 DIAGNOSIS — I2510 Atherosclerotic heart disease of native coronary artery without angina pectoris: Secondary | ICD-10-CM | POA: Insufficient documentation

## 2013-02-11 DIAGNOSIS — I739 Peripheral vascular disease, unspecified: Secondary | ICD-10-CM | POA: Diagnosis not present

## 2013-02-11 DIAGNOSIS — I4891 Unspecified atrial fibrillation: Secondary | ICD-10-CM | POA: Insufficient documentation

## 2013-02-11 DIAGNOSIS — Z860100 Personal history of colon polyps, unspecified: Secondary | ICD-10-CM | POA: Insufficient documentation

## 2013-02-11 DIAGNOSIS — Z1211 Encounter for screening for malignant neoplasm of colon: Secondary | ICD-10-CM | POA: Diagnosis not present

## 2013-02-11 DIAGNOSIS — I10 Essential (primary) hypertension: Secondary | ICD-10-CM | POA: Insufficient documentation

## 2013-02-11 DIAGNOSIS — Z7901 Long term (current) use of anticoagulants: Secondary | ICD-10-CM | POA: Insufficient documentation

## 2013-02-11 DIAGNOSIS — E7800 Pure hypercholesterolemia, unspecified: Secondary | ICD-10-CM | POA: Insufficient documentation

## 2013-02-11 DIAGNOSIS — M353 Polymyalgia rheumatica: Secondary | ICD-10-CM | POA: Diagnosis not present

## 2013-02-11 DIAGNOSIS — K219 Gastro-esophageal reflux disease without esophagitis: Secondary | ICD-10-CM | POA: Insufficient documentation

## 2013-02-11 HISTORY — PX: COLONOSCOPY WITH PROPOFOL: SHX5780

## 2013-02-11 HISTORY — DX: Other amnesia: R41.3

## 2013-02-11 SURGERY — COLONOSCOPY WITH PROPOFOL
Anesthesia: Monitor Anesthesia Care

## 2013-02-11 MED ORDER — PROPOFOL 10 MG/ML IV BOLUS
INTRAVENOUS | Status: AC
  Filled 2013-02-11: qty 20

## 2013-02-11 MED ORDER — PROPOFOL 10 MG/ML IV BOLUS
INTRAVENOUS | Status: DC | PRN
  Administered 2013-02-11 (×2): 50 mg via INTRAVENOUS

## 2013-02-11 MED ORDER — LACTATED RINGERS IV SOLN
INTRAVENOUS | Status: DC | PRN
  Administered 2013-02-11: 11:00:00 via INTRAVENOUS

## 2013-02-11 MED ORDER — PROPOFOL INFUSION 10 MG/ML OPTIME
INTRAVENOUS | Status: DC | PRN
  Administered 2013-02-11: 120 ug/kg/min via INTRAVENOUS

## 2013-02-11 MED ORDER — SODIUM CHLORIDE 0.9 % IV SOLN: INTRAVENOUS | Status: DC

## 2013-02-11 SURGICAL SUPPLY — 21 items

## 2013-02-11 NOTE — H&P (Signed)
  Procedure: Surveillance colonoscopy. Personal history of adenomatous colon polyps. Chronic Coumadin anticoagulation therapy.  History: The patient is a 79 year old female born 11/13/1933. The patient has undergone colonoscopic exams in the past to remove adenomatous colon polyps and is scheduled to undergo a surveillance colonoscopy today. She stopped taking Coumadin 5 days ago.  Medication allergies. None  Past medical history: Chronic atrial fibrillation. Hypertension. Osteoarthritis. Polymyalgia rheumatica. Constipation predominant irritable bowel syndrome. Hypercholesterolemia. Generalized anxiety disorder. Osteopenia. Coronary artery disease. Tonsillectomy. Lumbar laminectomy. Ectopic pregnancy. Ganglion cyst surgery. Bilateral cataract surgery.  Exam: The patient is alert and lying comfortably on the endoscopy stretcher. Abdomen is soft and nontender to palpation. Lungs are clear to auscultation. Cardiac exam reveals a regular rhythm despite her history of atrial fibrillation.  Plan: Proceed with surveillance colonoscopy.

## 2013-02-11 NOTE — Transfer of Care (Signed)
Immediate Anesthesia Transfer of Care Note  Patient: [name]  Procedure(s) Performed: Procedure(s) (LRB): COLONOSCOPY WITH PROPOFOL (N/A)  Patient Location: PACU  Anesthesia Type: MAC  Level of Consciousness: sedated, patient cooperative and responds to stimulation  Airway & Oxygen Therapy: Patient Spontanous Breathing and Patient connected to face mask oxgen  Post-op Assessment: Report given to PACU RN and Post -op Vital signs reviewed and stable  Post vital signs: Reviewed and stable  Complications: No apparent anesthesia complications

## 2013-02-11 NOTE — Anesthesia Postprocedure Evaluation (Signed)
Anesthesia Post Note  Patient: [name]  Procedure(s) Performed: Procedure(s) (LRB): COLONOSCOPY WITH PROPOFOL (N/A)  Anesthesia type: MAC  Patient location: PACU  Post pain: Pain level controlled  Post assessment: Post-op Vital signs reviewed  Last Vitals: BP 136/63  Pulse 52  Temp(Src) 37 C (Oral)  Resp 17  Ht 5\' 2"  (1.575 m)  Wt 118 lb (53.524 kg)  BMI 21.58 kg/m2  SpO2 99%  Post vital signs: Reviewed  Level of consciousness: awake  Complications: No apparent anesthesia complications

## 2013-02-11 NOTE — Op Note (Signed)
Procedure: Surveillance colonoscopy. Personal history of colon polyps  Endoscopist: Danise Edge  Premedication: Propofol administered by anesthesia  Procedure: The patient was placed in the left lateral decubitus position. Anal inspection and digital rectal exam were normal. The Pentax pediatric colonoscope was introduced into the rectum and advanced to the cecum. A normal-appearing ileocecal valve and appendiceal orifice were identified. Colonic preparation for the exam today was good. Colonoscopy was technically very difficult to perform due to looping in the left colon.  Rectum. Normal. Retroflexed view of the distal rectum normal.  Sigmoid colon and descending colon. Normal  Splenic flexure. Normal  Transverse colon. Normal  Hepatic flexure. Normal  Ascending colon. Normal  Cecum and ileocecal valve. Normal  Assessment: Normal surveillance proctocolonoscopy to the cecum

## 2013-02-11 NOTE — Preoperative (Signed)
Beta Blockers   Reason not to administer Beta Blockers:Not Applicable 

## 2013-02-11 NOTE — Anesthesia Preprocedure Evaluation (Signed)
Anesthesia Evaluation  Patient identified by MRN, date of birth, ID band Patient awake    Reviewed: Allergy & Precautions, H&P , NPO status , Patient's Chart, lab work & pertinent test results, reviewed documented beta blocker date and time   Airway       Dental   Pulmonary neg pulmonary ROS,          Cardiovascular hypertension, Pt. on medications and Pt. on home beta blockers + CAD and + Peripheral Vascular Disease + dysrhythmias Atrial Fibrillation     Neuro/Psych Anxiety negative neurological ROS     GI/Hepatic Neg liver ROS, GERD-  ,  Endo/Other  negative endocrine ROS  Renal/GU negative Renal ROS     Musculoskeletal negative musculoskeletal ROS (+)   Abdominal   Peds  Hematology negative hematology ROS (+)   Anesthesia Other Findings   Reproductive/Obstetrics negative OB ROS                           Anesthesia Physical Anesthesia Plan  ASA: III  Anesthesia Plan: MAC   Post-op Pain Management:    Induction: Intravenous  Airway Management Planned:   Additional Equipment:   Intra-op Plan:   Post-operative Plan:   Informed Consent: I have reviewed the patients History and Physical, chart, labs and discussed the procedure including the risks, benefits and alternatives for the proposed anesthesia with the patient or authorized representative who has indicated his/her understanding and acceptance.   Dental advisory given  Plan Discussed with: CRNA  Anesthesia Plan Comments:         Anesthesia Quick Evaluation

## 2013-02-12 ENCOUNTER — Encounter (HOSPITAL_COMMUNITY): Payer: Self-pay | Admitting: Gastroenterology

## 2013-02-12 DIAGNOSIS — M999 Biomechanical lesion, unspecified: Secondary | ICD-10-CM | POA: Diagnosis not present

## 2013-02-12 DIAGNOSIS — M9981 Other biomechanical lesions of cervical region: Secondary | ICD-10-CM | POA: Diagnosis not present

## 2013-02-12 DIAGNOSIS — M5137 Other intervertebral disc degeneration, lumbosacral region: Secondary | ICD-10-CM | POA: Diagnosis not present

## 2013-02-18 ENCOUNTER — Ambulatory Visit (INDEPENDENT_AMBULATORY_CARE_PROVIDER_SITE_OTHER): Payer: Medicare Other | Admitting: General Practice

## 2013-02-18 DIAGNOSIS — I480 Paroxysmal atrial fibrillation: Secondary | ICD-10-CM

## 2013-02-18 DIAGNOSIS — Z7901 Long term (current) use of anticoagulants: Secondary | ICD-10-CM

## 2013-02-18 DIAGNOSIS — I4891 Unspecified atrial fibrillation: Secondary | ICD-10-CM

## 2013-02-21 DIAGNOSIS — M5137 Other intervertebral disc degeneration, lumbosacral region: Secondary | ICD-10-CM | POA: Diagnosis not present

## 2013-02-21 DIAGNOSIS — M999 Biomechanical lesion, unspecified: Secondary | ICD-10-CM | POA: Diagnosis not present

## 2013-02-21 DIAGNOSIS — M9981 Other biomechanical lesions of cervical region: Secondary | ICD-10-CM | POA: Diagnosis not present

## 2013-02-28 ENCOUNTER — Ambulatory Visit (INDEPENDENT_AMBULATORY_CARE_PROVIDER_SITE_OTHER): Payer: Medicare Other | Admitting: *Deleted

## 2013-02-28 DIAGNOSIS — I480 Paroxysmal atrial fibrillation: Secondary | ICD-10-CM

## 2013-02-28 DIAGNOSIS — I4891 Unspecified atrial fibrillation: Secondary | ICD-10-CM

## 2013-02-28 DIAGNOSIS — Z7901 Long term (current) use of anticoagulants: Secondary | ICD-10-CM | POA: Diagnosis not present

## 2013-02-28 LAB — POCT INR: INR: 3.9

## 2013-03-14 ENCOUNTER — Ambulatory Visit (INDEPENDENT_AMBULATORY_CARE_PROVIDER_SITE_OTHER): Payer: Medicare Other | Admitting: *Deleted

## 2013-03-14 DIAGNOSIS — I4891 Unspecified atrial fibrillation: Secondary | ICD-10-CM | POA: Diagnosis not present

## 2013-03-14 DIAGNOSIS — I480 Paroxysmal atrial fibrillation: Secondary | ICD-10-CM

## 2013-03-14 DIAGNOSIS — Z7901 Long term (current) use of anticoagulants: Secondary | ICD-10-CM | POA: Diagnosis not present

## 2013-03-14 LAB — POCT INR: INR: 3.9

## 2013-03-23 ENCOUNTER — Other Ambulatory Visit: Payer: Self-pay | Admitting: Cardiology

## 2013-03-28 ENCOUNTER — Ambulatory Visit (INDEPENDENT_AMBULATORY_CARE_PROVIDER_SITE_OTHER): Payer: Medicare Other

## 2013-03-28 DIAGNOSIS — Z7901 Long term (current) use of anticoagulants: Secondary | ICD-10-CM

## 2013-03-28 DIAGNOSIS — I4891 Unspecified atrial fibrillation: Secondary | ICD-10-CM

## 2013-03-28 DIAGNOSIS — I480 Paroxysmal atrial fibrillation: Secondary | ICD-10-CM

## 2013-03-28 LAB — POCT INR: INR: 4

## 2013-04-11 ENCOUNTER — Ambulatory Visit (INDEPENDENT_AMBULATORY_CARE_PROVIDER_SITE_OTHER): Payer: Medicare Other

## 2013-04-11 DIAGNOSIS — I4891 Unspecified atrial fibrillation: Secondary | ICD-10-CM | POA: Diagnosis not present

## 2013-04-11 DIAGNOSIS — I480 Paroxysmal atrial fibrillation: Secondary | ICD-10-CM

## 2013-04-11 DIAGNOSIS — Z7901 Long term (current) use of anticoagulants: Secondary | ICD-10-CM | POA: Diagnosis not present

## 2013-04-11 LAB — POCT INR: INR: 4

## 2013-04-23 DIAGNOSIS — Z0000 Encounter for general adult medical examination without abnormal findings: Secondary | ICD-10-CM | POA: Diagnosis not present

## 2013-04-25 ENCOUNTER — Ambulatory Visit (INDEPENDENT_AMBULATORY_CARE_PROVIDER_SITE_OTHER): Payer: Medicare Other | Admitting: *Deleted

## 2013-04-25 DIAGNOSIS — Z7901 Long term (current) use of anticoagulants: Secondary | ICD-10-CM | POA: Diagnosis not present

## 2013-04-25 DIAGNOSIS — I4891 Unspecified atrial fibrillation: Secondary | ICD-10-CM | POA: Diagnosis not present

## 2013-04-25 DIAGNOSIS — Z5181 Encounter for therapeutic drug level monitoring: Secondary | ICD-10-CM | POA: Insufficient documentation

## 2013-04-25 DIAGNOSIS — I480 Paroxysmal atrial fibrillation: Secondary | ICD-10-CM

## 2013-04-25 LAB — POCT INR: INR: 2.1

## 2013-05-13 ENCOUNTER — Emergency Department (HOSPITAL_COMMUNITY)
Admission: EM | Admit: 2013-05-13 | Discharge: 2013-05-13 | Disposition: A | Discharge status: Home / Self Care | Payer: Medicare Other | Attending: Emergency Medicine | Admitting: Emergency Medicine

## 2013-05-13 ENCOUNTER — Encounter (HOSPITAL_COMMUNITY): Payer: Self-pay | Admitting: Emergency Medicine

## 2013-05-13 DIAGNOSIS — M810 Age-related osteoporosis without current pathological fracture: Secondary | ICD-10-CM | POA: Diagnosis not present

## 2013-05-13 DIAGNOSIS — Z79899 Other long term (current) drug therapy: Secondary | ICD-10-CM | POA: Diagnosis not present

## 2013-05-13 DIAGNOSIS — R51 Headache: Secondary | ICD-10-CM | POA: Diagnosis not present

## 2013-05-13 DIAGNOSIS — I2510 Atherosclerotic heart disease of native coronary artery without angina pectoris: Secondary | ICD-10-CM | POA: Insufficient documentation

## 2013-05-13 DIAGNOSIS — I4891 Unspecified atrial fibrillation: Secondary | ICD-10-CM | POA: Insufficient documentation

## 2013-05-13 DIAGNOSIS — E559 Vitamin D deficiency, unspecified: Secondary | ICD-10-CM | POA: Insufficient documentation

## 2013-05-13 DIAGNOSIS — Z8659 Personal history of other mental and behavioral disorders: Secondary | ICD-10-CM | POA: Insufficient documentation

## 2013-05-13 DIAGNOSIS — Z8742 Personal history of other diseases of the female genital tract: Secondary | ICD-10-CM | POA: Insufficient documentation

## 2013-05-13 DIAGNOSIS — R002 Palpitations: Secondary | ICD-10-CM

## 2013-05-13 DIAGNOSIS — Z9889 Other specified postprocedural states: Secondary | ICD-10-CM | POA: Insufficient documentation

## 2013-05-13 DIAGNOSIS — I10 Essential (primary) hypertension: Secondary | ICD-10-CM | POA: Insufficient documentation

## 2013-05-13 DIAGNOSIS — E785 Hyperlipidemia, unspecified: Secondary | ICD-10-CM | POA: Diagnosis not present

## 2013-05-13 DIAGNOSIS — K219 Gastro-esophageal reflux disease without esophagitis: Secondary | ICD-10-CM | POA: Insufficient documentation

## 2013-05-13 DIAGNOSIS — Z7901 Long term (current) use of anticoagulants: Secondary | ICD-10-CM | POA: Insufficient documentation

## 2013-05-13 LAB — I-STAT TROPONIN, ED: Troponin i, poc: 0 ng/mL (ref 0.00–0.08)

## 2013-05-13 LAB — BASIC METABOLIC PANEL
BUN: 16 mg/dL (ref 6–23)
CALCIUM: 9.4 mg/dL (ref 8.4–10.5)
CO2: 25 mEq/L (ref 19–32)
CREATININE: 0.62 mg/dL (ref 0.50–1.10)
Chloride: 106 mEq/L (ref 96–112)
GFR calc Af Amer: >90 mL/min (ref >90)
GFR calc non Af Amer: 84 mL/min — ABNORMAL LOW (ref >90)
GLUCOSE: 105 mg/dL — AB (ref 70–99)
Potassium: 3.8 mEq/L (ref 3.7–5.3)
Sodium: 143 mEq/L (ref 137–147)

## 2013-05-13 LAB — CBC
HCT: 39.6 % (ref 36.0–46.0)
Hemoglobin: 13.7 g/dL (ref 12.0–15.0)
MCH: 32.8 pg (ref 26.0–34.0)
MCHC: 34.6 g/dL (ref 30.0–36.0)
MCV: 94.7 fL (ref 78.0–100.0)
Platelets: 141 10*3/uL — ABNORMAL LOW (ref 150–400)
RBC: 4.18 MIL/uL (ref 3.87–5.11)
RDW: 12.6 % (ref 11.5–15.5)
WBC: 5.9 10*3/uL (ref 4.0–10.5)

## 2013-05-13 NOTE — ED Notes (Signed)
PT denies facial numbness/tightness at this time.

## 2013-05-13 NOTE — ED Provider Notes (Signed)
CSN: [id number]     Arrival date & time 05/13/13  1857 History   First MD Initiated Contact with Patient 05/13/13 1958     Chief Complaint  Patient presents with  . Atrial Fibrillation  . Facial Pain     (Consider location/radiation/quality/duration/timing/severity/associated sxs/prior Treatment) Patient is a 80 y.o. female presenting with atrial fibrillation. The history is provided by the patient and the spouse.  Atrial Fibrillation   She complains of a sensation of skipped beats, paused beats, and racing heartbeat; on and off, today. She feels like she might be going in and out of atrial fibrillation. He denies chest pain, nausea, vomiting, cough, shortness of breath, weakness, or dizziness. No near-syncope or syncope. He has a sensation of facial tightness in the area of her maxillary sinuses. No nasal discharge, ear pain, sore throat or neck pain. No similar problems in the past. She using her usual medications, without relief. There are no other known modifying factors  Past Medical History  Diagnosis Date  . Spinal stenosis   . Anxiety   . GERD (gastroesophageal reflux disease)   . Osteoporosis   . Paroxysmal atrial fibrillation     Coumadin therapy  . Hypertension   . Hyperlipidemia   . CAD (coronary artery disease) 2008/2010    Catheterization 2008, 40% LAD after first diagonal  /  nuclear January, 2010 normal  . Carotid artery disease     Doppler, July, 2011, 0-39% bilateral  . Endometrial polyp   . Atrophic vaginitis   . Vitamin D deficiency   . Stress   . Normal nuclear stress test Feb 2013    No ischemia. Normal wall motion. EF greater than 70%  . Osteopenia   . Palpitations     Monitor, May, 2013, no atrial fib,  one short run of PSVT  . Memory difficulty 01-20-13    "memory issues"   Past Surgical History  Procedure Laterality Date  . Laminectomy  March 2009    L4 through L5  . Cataract surgery  2009  . Tonsillectomy    . Hysteroscopy  02/07/2005   HYST W D&C, DX: PMB W SMALL ENDO POLYP-SURGEON [name]  . Breast surgery      BENIGN BREAST LUMP  . Excision morton's neuroma  2000  . Hip surgery Right 2005    debridement  . Dilation and curettage of uterus    . Colonoscopy with propofol N/A 02/11/2013    Procedure: COLONOSCOPY WITH PROPOFOL;  Surgeon: [name], MD;  Location: [facility];  Service: Endoscopy;  Laterality: N/A;   Family History  Problem Relation Age of Onset  . Transient ischemic attack Mother   . Stroke Mother   . Cancer Father     PROSTATE  . Pancreatitis Father   . Stroke Sister   . Spina bifida Brother    History  Substance Use Topics  . Smoking status: Never Smoker   . Smokeless tobacco: Never Used  . Alcohol Use: Yes     Comment: very rare   OB History   Grav Para Term Preterm Abortions TAB SAB Ect Mult Living   4 2 2  2     2      Review of Systems  All other systems reviewed and are negative.      Allergies  Review of patient's allergies indicates no known allergies.  Home Medications   Current Outpatient Rx  Name  Route  Sig  Dispense  Refill  . Calcium-Magnesium-Vitamin D (CALCIUM  500 PO)   Oral   Take 1 tablet by mouth daily.         . cholecalciferol (VITAMIN D) 1000 UNITS tablet   Oral   Take 1,000 Units by mouth daily.         [name] lisinopril (PRINIVIL,ZESTRIL) 5 MG tablet   Oral   Take 5 mg by mouth every morning.         . metoprolol tartrate (LOPRESSOR) 25 MG tablet   Oral   Take 25 mg by mouth 2 (two) times daily.         . Multiple Vitamins-Minerals (OCUVITE PRESERVISION) TABS   Oral   Take 1 each by mouth daily.           . Risedronate Sodium (ATELVIA) 35 MG TBEC   Oral   Take 1 tablet by mouth once a week.         . simvastatin (ZOCOR) 20 MG tablet      TAKE 1 TABLET BY MOUTH EVERY EVENING   30 tablet   5   . warfarin (COUMADIN) 5 MG tablet   Oral   Take 5 mg by mouth every evening.           BP 177/65  Pulse 67  Temp(Src)  97.7 F (36.5 C) (Oral)  Resp 18  Wt 100 lb (45.36 kg)  SpO2 99% Physical Exam  Nursing note and vitals reviewed. Constitutional: She is oriented to person, place, and time. She appears well-developed.  Elderly, frail  HENT:  Head: Normocephalic and atraumatic.  Eyes: Conjunctivae and EOM are normal. Pupils are equal, round, and reactive to light.  Neck: Normal range of motion and phonation normal. Neck supple.  Cardiovascular: Normal rate, regular rhythm and intact distal pulses.   During the time of examination, there were a few PACs seen on the cardiac monitor.  Pulmonary/Chest: Effort normal and breath sounds normal. No respiratory distress. She has no wheezes. She has no rales. She exhibits no tenderness.  Abdominal: Soft. She exhibits no distension. There is no tenderness. There is no guarding.  Musculoskeletal: Normal range of motion. She exhibits no edema and no tenderness.  Neurological: She is alert and oriented to person, place, and time. No cranial nerve deficit. She exhibits normal muscle tone. Coordination normal.  Skin: Skin is warm and dry.  Psychiatric: She has a normal mood and affect. Her behavior is normal. Judgment and thought content normal.    ED Course  Procedures (including critical care time)   Medications - No data to display  Patient Vitals for the past 24 hrs:  BP Temp Temp src Pulse Resp SpO2 Weight  05/13/13 1942 177/65 mmHg - - 67 18 99 % -  05/13/13 1904 190/63 mmHg 97.7 F (36.5 C) Oral 73 18 100 % 100 lb (45.36 kg)    8:37 PM Reevaluation with update and discussion. After initial assessment and treatment, an updated evaluation reveals no further c/o. [place] Review Labs Reviewed  CBC - Abnormal; Notable for the following:    Platelets 141 (*)    All other components within normal limits  BASIC METABOLIC PANEL - Abnormal; Notable for the following:    Glucose, Bld 105 (*)    GFR calc non Af Amer 84 (*)    All other  components within normal limits  I-STAT TROPOININ, ED   Imaging Review No results found.   EKG Interpretation   Date/Time:  Tuesday May 13 2013  19:01:37 EST Ventricular Rate:  68 PR Interval:  146 QRS Duration: 70 QT Interval:  364 QTC Calculation: 387 R Axis:   77 Text Interpretation:  Normal sinus rhythm Septal infarct , age  undetermined Abnormal ECG since last tracing no significant change  Confirmed by Eulis Foster  MD, Kazaria Gaertner ([id number]) on 05/13/2013 7:32:07 PM      MDM   Final diagnoses:  Palpitation    Nonspecific palpitations with initial normal EKG and cardiac testing. No worrisome symptoms such as near syncope or syncope.    Nursing Notes Reviewed/ Care Coordinated Applicable Imaging Reviewed Interpretation of Laboratory Data incorporated into ED treatment  The patient appears reasonably screened and/or stabilized for discharge and I doubt any other medical condition or other [facility] requiring further screening, evaluation, or treatment in the ED at this time prior to discharge.  Plan: Home Medications- usual, Consider Afrin for nasal congestion; Home Treatments- rest; return here if the recommended treatment, does not improve the symptoms; Recommended follow up- PCP 1 week  Richarda Blade, MD 05/13/13 2038

## 2013-05-13 NOTE — ED Notes (Signed)
Pt comfortable with discharge and follow up instructions. No prescriptions. 

## 2013-05-13 NOTE — Discharge Instructions (Signed)
Plenty of rest, and drink a lot of fluids. You can try using a nasal spray like Afrin for 1 or 2 days, to help with the nasal congestion. If you use it more often than that, it may tend to cause your heart to race. Return here, if needed, for problems.  Palpitations  A palpitation is the feeling that your heartbeat is irregular or is faster than normal. It may feel like your heart is fluttering or skipping a beat. Palpitations are usually not a serious problem. However, in some cases, you may need further medical evaluation. CAUSES  Palpitations can be caused by:  Smoking.  Caffeine or other stimulants, such as diet pills or energy drinks.  Alcohol.  Stress and anxiety.  Strenuous physical activity.  Fatigue.  Certain medicines.  Heart disease, especially if you have a history of arrhythmias. This includes atrial fibrillation, atrial flutter, or supraventricular tachycardia.  An improperly working pacemaker or defibrillator. DIAGNOSIS  To find the cause of your palpitations, your caregiver will take your history and perform a physical exam. Tests may also be done, including:  Electrocardiography (ECG). This test records the heart's electrical activity.  Cardiac monitoring. This allows your caregiver to monitor your heart rate and rhythm in real time.  Holter monitor. This is a portable device that records your heartbeat and can help diagnose heart arrhythmias. It allows your caregiver to track your heart activity for several days, if needed.  Stress tests by exercise or by giving medicine that makes the heart beat faster. TREATMENT  Treatment of palpitations depends on the cause of your symptoms and can vary greatly. Most cases of palpitations do not require any treatment other than time, relaxation, and monitoring your symptoms. Other causes, such as atrial fibrillation, atrial flutter, or supraventricular tachycardia, usually require further treatment. HOME CARE INSTRUCTIONS     Avoid:  Caffeinated coffee, tea, soft drinks, diet pills, and energy drinks.  Chocolate.  Alcohol.  Stop smoking if you smoke.  Reduce your stress and anxiety. Things that can help you relax include:  A method that measures bodily functions so you can learn to control them (biofeedback).  Yoga.  Meditation.  Physical activity such as swimming, jogging, or walking.  Get plenty of rest and sleep. SEEK MEDICAL CARE IF:   You continue to have a fast or irregular heartbeat beyond 24 hours.  Your palpitations occur more often. SEEK IMMEDIATE MEDICAL CARE IF:  You develop chest pain or shortness of breath.  You have a severe headache.  You feel dizzy, or you faint. MAKE SURE YOU:  Understand these instructions.  Will watch your condition.  Will get help right away if you are not doing well or get worse. Document Released: 02/25/2000 Document Revised: 06/24/2012 Document Reviewed: 04/28/2011 [facility] Patient Information 2014 [place].

## 2013-05-13 NOTE — ED Notes (Signed)
Pt is here with facial tightness, high blood pressure, and feels like she is going in and out of afib

## 2013-05-16 ENCOUNTER — Ambulatory Visit (INDEPENDENT_AMBULATORY_CARE_PROVIDER_SITE_OTHER): Payer: Medicare Other

## 2013-05-16 DIAGNOSIS — I4891 Unspecified atrial fibrillation: Secondary | ICD-10-CM | POA: Diagnosis not present

## 2013-05-16 DIAGNOSIS — Z7901 Long term (current) use of anticoagulants: Secondary | ICD-10-CM | POA: Diagnosis not present

## 2013-05-16 DIAGNOSIS — Z5181 Encounter for therapeutic drug level monitoring: Secondary | ICD-10-CM

## 2013-05-16 DIAGNOSIS — I480 Paroxysmal atrial fibrillation: Secondary | ICD-10-CM

## 2013-05-16 LAB — POCT INR: INR: 1.8

## 2013-05-26 ENCOUNTER — Encounter: Payer: Self-pay | Admitting: Cardiology

## 2013-05-26 ENCOUNTER — Ambulatory Visit (INDEPENDENT_AMBULATORY_CARE_PROVIDER_SITE_OTHER): Payer: Medicare Other | Admitting: Cardiology

## 2013-05-26 VITALS — BP 140/76 | HR 65 | Ht 62.0 in | Wt 99.0 lb

## 2013-05-26 DIAGNOSIS — I10 Essential (primary) hypertension: Secondary | ICD-10-CM

## 2013-05-26 DIAGNOSIS — I4891 Unspecified atrial fibrillation: Secondary | ICD-10-CM

## 2013-05-26 DIAGNOSIS — Z7901 Long term (current) use of anticoagulants: Secondary | ICD-10-CM | POA: Diagnosis not present

## 2013-05-26 DIAGNOSIS — I2510 Atherosclerotic heart disease of native coronary artery without angina pectoris: Secondary | ICD-10-CM

## 2013-05-26 DIAGNOSIS — R002 Palpitations: Secondary | ICD-10-CM

## 2013-05-26 DIAGNOSIS — I480 Paroxysmal atrial fibrillation: Secondary | ICD-10-CM

## 2013-05-26 DIAGNOSIS — E785 Hyperlipidemia, unspecified: Secondary | ICD-10-CM

## 2013-05-26 LAB — TSH: TSH: 2.43 u[IU]/mL (ref 0.35–5.50)

## 2013-05-26 MED ORDER — DILTIAZEM HCL ER COATED BEADS 120 MG PO CP24: 120.0000 mg | ORAL_CAPSULE | Freq: Every day | ORAL | Status: DC

## 2013-05-26 NOTE — Assessment & Plan Note (Signed)
The patient had minimal coronary disease in the past. Nuclear scan in February, 2013 revealed no ischemia. She does not need further exercise testing at this point.

## 2013-05-26 NOTE — Assessment & Plan Note (Signed)
There is a history of paroxysmal atrial fibrillation. She is fully anticoagulated. No change in therapy.

## 2013-05-26 NOTE — Progress Notes (Signed)
HPI  Patient is seen today as an add-on. She called saying that she was having increased palpitations. She had been in the emergency room May 13, 2013. She complained there of palpitations. She was noted to be in sinus rhythm. Her screening labs were normal. TSH was not drawn. Since being at home she continues to question the possibility of pauses in her heart rhythm. She has not had syncope or presyncope. She also feels short bursts of rapid heartbeat.  As part of today's evaluation I have reviewed my old records. I have also reviewed carefully the emergency room reports from her ER visit.  No Known Allergies  Current Outpatient Prescriptions  Medication Sig Dispense Refill  . Calcium-Magnesium-Vitamin D (CALCIUM 500 PO) Take 1 tablet by mouth daily.      . cholecalciferol (VITAMIN D) 1000 UNITS tablet Take 1,000 Units by mouth daily.      [name] lisinopril (PRINIVIL,ZESTRIL) 5 MG tablet Take 5 mg by mouth every morning.      . metoprolol tartrate (LOPRESSOR) 25 MG tablet Take 25 mg by mouth 2 (two) times daily.      . Multiple Vitamins-Minerals (OCUVITE PRESERVISION) TABS Take 1 each by mouth daily.        . Risedronate Sodium (ATELVIA) 35 MG TBEC Take 1 tablet by mouth once a week.      . simvastatin (ZOCOR) 20 MG tablet TAKE 1 TABLET BY MOUTH EVERY EVENING  30 tablet  5  . terconazole (TERAZOL 7) 0.4 % vaginal cream       . warfarin (COUMADIN) 5 MG tablet Take 5 mg by mouth every evening.       . [DISCONTINUED] estradiol (ESTRACE) 1 MG tablet Take 1 mg by mouth as needed.        No current facility-administered medications for this visit.    History   Social History  . Marital Status: Married    Spouse Name: N/A    Number of Children: N/A  . Years of Education: N/A   Occupational History  . Retired    Social History Main Topics  . Smoking status: Never Smoker   . Smokeless tobacco: Never Used  . Alcohol Use: Yes     Comment: very rare  . Drug Use: No  . Sexual Activity:  No   Other Topics Concern  . Not on file   Social History Narrative   Retired; Married;      Has had increased stress as her husband had a stroke ~ 3wks ago and she has been taking care of him. (04/07/11)    Family History  Problem Relation Age of Onset  . Transient ischemic attack Mother   . Stroke Mother   . Cancer Father     PROSTATE  . Pancreatitis Father   . Stroke Sister   . Spina bifida Brother     Past Medical History  Diagnosis Date  . Spinal stenosis   . Anxiety   . GERD (gastroesophageal reflux disease)   . Osteoporosis   . Paroxysmal atrial fibrillation     Coumadin therapy  . Hypertension   . Hyperlipidemia   . CAD (coronary artery disease) 2008/2010    Catheterization 2008, 40% LAD after first diagonal  /  nuclear January, 2010 normal  . Carotid artery disease     Doppler, July, 2011, 0-39% bilateral  . Endometrial polyp   . Atrophic vaginitis   . Vitamin D deficiency   . Stress   . Normal  nuclear stress test Feb 2013    No ischemia. Normal wall motion. EF greater than 70%  . Osteopenia   . Palpitations     Monitor, May, 2013, no atrial fib,  one short run of PSVT  . Memory difficulty 01-20-13    "memory issues"    Past Surgical History  Procedure Laterality Date  . Laminectomy  March 2009    L4 through L5  . Cataract surgery  2009  . Tonsillectomy    . Hysteroscopy  02/07/2005    HYST W D&C, DX: PMB W SMALL ENDO POLYP-SURGEON [name]  . Breast surgery      BENIGN BREAST LUMP  . Excision morton's neuroma  2000  . Hip surgery Right 2005    debridement  . Dilation and curettage of uterus    . Colonoscopy with propofol N/A 02/11/2013    Procedure: COLONOSCOPY WITH PROPOFOL;  Surgeon: [name], MD;  Location: [facility];  Service: Endoscopy;  Laterality: N/A;    Patient Active Problem List   Diagnosis Date Noted  . Encounter for therapeutic drug monitoring 04/25/2013  . Palpitations   . Osteopenia   . Chest pain 04/07/2011    . Spinal stenosis   . Anxiety   . Warfarin anticoagulation   . GERD (gastroesophageal reflux disease)   . Osteoporosis   . Paroxysmal atrial fibrillation   . Hypertension   . Hyperlipidemia   . Carotid artery disease   . CAD (coronary artery disease)   . Ejection fraction     ROS   Patient denies fever, chills, headache, sweats, rash, change in vision, change in hearing, chest pain, cough, nausea vomiting, urinary symptoms. All other systems are reviewed and are negative.  PHYSICAL EXAM  Patient is anxious this morning. She seems better after a complete discussion. She is oriented to person time and place. Affect is normal. There is no jugulovenous distention. Lungs are clear. Respiratory effort is nonlabored. Cardiac exam reveals S1 and S2. The rhythm is regular. The abdomen is soft. There is no peripheral edema. There are no musculoskeletal deformities. There are no skin rashes.  Filed Vitals:   05/26/13 0915  BP: 140/76  Pulse: 65  Height: 5\' 2"  (1.575 m)  Weight: 99 lb (44.906 kg)   EKG is done today and reviewed by me. There is sinus rhythm. The QRS reveals mild nonspecific ST-T wave changes.  ASSESSMENT & PLAN

## 2013-05-26 NOTE — Assessment & Plan Note (Signed)
There is a history of paroxysmal atrial fibrillation. When a Holter was done in 2013 for palpitations we did not see atrial fibrillation. There was 114 beat run of supraventricular tachycardia.

## 2013-05-26 NOTE — Assessment & Plan Note (Signed)
Blood pressure is controlled. No change in therapy. 

## 2013-05-26 NOTE — Assessment & Plan Note (Signed)
She's been having palpitations. I had a long and careful discussion with her. I explained to her that the pauses may be related to having PACs. I explained that I was not concerned about this. I told her that her palpitations might be very short bursts of atrial fib or very short bursts of the other type of supraventricular tachycardia that we saw on her monitor in 2013. In my mind this would be responsive to diltiazem. Therefore we will start her on a low dose of diltiazem. It was important to give her reassurance. I will see her back for early followup.  As part of today's evaluation I spent greater than 25 minutes with her total care. More than half of this time was spent with reviewing all of her symptoms and trying to reassure her. In addition she was added to my schedule today.

## 2013-05-26 NOTE — Assessment & Plan Note (Signed)
Patient is receiving medications for her lipids.

## 2013-05-26 NOTE — Patient Instructions (Signed)
Your physician has recommended you make the following change in your medication: start taking Diltiazem 120 mg daily  Your physician recommends that you return for lab work in: today  Your physician recommends that you schedule a follow-up appointment in: 3 to 4 weeks

## 2013-05-30 DIAGNOSIS — J310 Chronic rhinitis: Secondary | ICD-10-CM | POA: Diagnosis not present

## 2013-06-06 ENCOUNTER — Ambulatory Visit (INDEPENDENT_AMBULATORY_CARE_PROVIDER_SITE_OTHER): Payer: Medicare Other | Admitting: *Deleted

## 2013-06-06 DIAGNOSIS — Z7901 Long term (current) use of anticoagulants: Secondary | ICD-10-CM

## 2013-06-06 DIAGNOSIS — I4891 Unspecified atrial fibrillation: Secondary | ICD-10-CM | POA: Diagnosis not present

## 2013-06-06 DIAGNOSIS — I480 Paroxysmal atrial fibrillation: Secondary | ICD-10-CM

## 2013-06-06 DIAGNOSIS — Z5181 Encounter for therapeutic drug level monitoring: Secondary | ICD-10-CM

## 2013-06-06 LAB — POCT INR: INR: 1.9

## 2013-06-17 ENCOUNTER — Ambulatory Visit: Payer: Medicare Other | Admitting: Cardiology

## 2013-06-19 ENCOUNTER — Other Ambulatory Visit: Payer: Self-pay | Admitting: Cardiology

## 2013-06-20 ENCOUNTER — Encounter: Payer: Self-pay | Admitting: Cardiology

## 2013-06-20 ENCOUNTER — Ambulatory Visit (INDEPENDENT_AMBULATORY_CARE_PROVIDER_SITE_OTHER): Payer: Medicare Other | Admitting: Cardiology

## 2013-06-20 ENCOUNTER — Ambulatory Visit (INDEPENDENT_AMBULATORY_CARE_PROVIDER_SITE_OTHER): Payer: Medicare Other | Admitting: Pharmacist

## 2013-06-20 VITALS — BP 168/62 | HR 60 | Ht 62.0 in | Wt 100.0 lb

## 2013-06-20 DIAGNOSIS — Z7901 Long term (current) use of anticoagulants: Secondary | ICD-10-CM

## 2013-06-20 DIAGNOSIS — I2510 Atherosclerotic heart disease of native coronary artery without angina pectoris: Secondary | ICD-10-CM

## 2013-06-20 DIAGNOSIS — I4891 Unspecified atrial fibrillation: Secondary | ICD-10-CM

## 2013-06-20 DIAGNOSIS — I480 Paroxysmal atrial fibrillation: Secondary | ICD-10-CM

## 2013-06-20 DIAGNOSIS — Z5181 Encounter for therapeutic drug level monitoring: Secondary | ICD-10-CM

## 2013-06-20 LAB — POCT INR: INR: 2

## 2013-06-20 NOTE — Assessment & Plan Note (Signed)
She will continue to take her Coumadin.

## 2013-06-20 NOTE — Patient Instructions (Signed)
**Note De-identified  Obfuscation** Your physician recommends that you continue on your current medications as directed. Please refer to the Current Medication list given to you today.  Your physician wants you to follow-up in: 6 months. You will receive a reminder letter in the mail two months in advance. If you don't receive a letter, please call our office to schedule the follow-up appointment.  

## 2013-06-20 NOTE — Progress Notes (Signed)
Patient ID: [name], female   DOB: [birth year], 80 y.o.   MRN: [id number]    HPI  Patient is seen to followup palpitations. I saw her last May 26, 2013. Diltiazem 120 long acting was added at that time. She still having palpitations. They are intermittent and she is anxious about this. However she says that she has read about atrial fib and feels more confident about it.  No Known Allergies  Current Outpatient Prescriptions  Medication Sig Dispense Refill  . Calcium-Magnesium-Vitamin D (CALCIUM 500 PO) Take 1 tablet by mouth daily.      . cholecalciferol (VITAMIN D) 1000 UNITS tablet Take 1,000 Units by mouth daily.      [name] diltiazem (CARDIZEM CD) 120 MG 24 hr capsule Take 1 capsule (120 mg total) by mouth daily.  30 capsule  3  . lisinopril (PRINIVIL,ZESTRIL) 5 MG tablet Take 5 mg by mouth every morning.      . metoprolol tartrate (LOPRESSOR) 25 MG tablet Take 25 mg by mouth 2 (two) times daily.      . Multiple Vitamins-Minerals (OCUVITE PRESERVISION) TABS Take 1 each by mouth daily.        . Risedronate Sodium (ATELVIA) 35 MG TBEC Take 1 tablet by mouth once a week.      . simvastatin (ZOCOR) 20 MG tablet TAKE 1 TABLET BY MOUTH EVERY EVENING  30 tablet  5  . warfarin (COUMADIN) 5 MG tablet Take 5 mg by mouth every evening.       . [DISCONTINUED] estradiol (ESTRACE) 1 MG tablet Take 1 mg by mouth as needed.        No current facility-administered medications for this visit.    History   Social History  . Marital Status: Married    Spouse Name: N/A    Number of Children: N/A  . Years of Education: N/A   Occupational History  . Retired    Social History Main Topics  . Smoking status: Never Smoker   . Smokeless tobacco: Never Used  . Alcohol Use: Yes     Comment: very rare  . Drug Use: No  . Sexual Activity: No   Other Topics Concern  . Not on file   Social History Narrative   Retired; Married;      Has had increased stress as her husband had a stroke ~ 3wks ago  and she has been taking care of him. (04/07/11)    Family History  Problem Relation Age of Onset  . Transient ischemic attack Mother   . Stroke Mother   . Cancer Father     PROSTATE  . Pancreatitis Father   . Stroke Sister   . Spina bifida Brother     Past Medical History  Diagnosis Date  . Spinal stenosis   . Anxiety   . GERD (gastroesophageal reflux disease)   . Osteoporosis   . Paroxysmal atrial fibrillation     Coumadin therapy  . Hypertension   . Hyperlipidemia   . CAD (coronary artery disease) 2008/2010    Catheterization 2008, 40% LAD after first diagonal  /  nuclear January, 2010 normal  . Carotid artery disease     Doppler, July, 2011, 0-39% bilateral  . Endometrial polyp   . Atrophic vaginitis   . Vitamin D deficiency   . Stress   . Normal nuclear stress test Feb 2013    No ischemia. Normal wall motion. EF greater than 70%  . Osteopenia   . Palpitations  Monitor, May, 2013, no atrial fib,  one short run of PSVT  . Memory difficulty 01-20-13    "memory issues"    Past Surgical History  Procedure Laterality Date  . Laminectomy  March 2009    L4 through L5  . Cataract surgery  2009  . Tonsillectomy    . Hysteroscopy  02/07/2005    HYST W D&C, DX: PMB W SMALL ENDO POLYP-SURGEON [name]  . Breast surgery      BENIGN BREAST LUMP  . Excision morton's neuroma  2000  . Hip surgery Right 2005    debridement  . Dilation and curettage of uterus    . Colonoscopy with propofol N/A 02/11/2013    Procedure: COLONOSCOPY WITH PROPOFOL;  Surgeon: [name], MD;  Location: [facility];  Service: Endoscopy;  Laterality: N/A;    Patient Active Problem List   Diagnosis Date Noted  . Encounter for therapeutic drug monitoring 04/25/2013  . Palpitations   . Osteopenia   . Chest pain 04/07/2011  . Spinal stenosis   . Anxiety   . Warfarin anticoagulation   . GERD (gastroesophageal reflux disease)   . Osteoporosis   . Paroxysmal atrial fibrillation   .  Hypertension   . Hyperlipidemia   . Carotid artery disease   . CAD (coronary artery disease)   . Ejection fraction     ROS   Patient's denies fever, chills, headache, sweats, rash, change in vision, change in hearing, chest pain, cough, nausea or vomiting, urinary symptoms. All other systems are reviewed and are negative.  PHYSICAL EXAM  Patient is oriented to person time and place. Affect is normal. She does seem anxious about her palpitations. I did my best to be reassuring. There is no jugulovenous distention. Lungs are clear. Respiratory effort is not labored. Cardiac exam shows S1 and S2. There no clicks or significant murmurs. The abdomen is soft. There is no peripheral edema.  Filed Vitals:   06/20/13 1432  BP: 168/62  Pulse: 60  Height: 5\' 2"  (1.575 m)  Weight: 100 lb (45.36 kg)     ASSESSMENT & PLAN

## 2013-06-20 NOTE — Assessment & Plan Note (Signed)
There is a history of paroxysmal atrial fib. The most recent Holter did not show atrial fibrillation. She had some supraventricular tachycardia that was not atrial fib. I have not repeated her Holter recently. I will consider that at the time of the next visit. She will continue on diltiazem low-dose. I feel that reassurance is the most important issue at this time.

## 2013-06-27 ENCOUNTER — Encounter: Payer: Self-pay | Admitting: *Deleted

## 2013-07-02 ENCOUNTER — Ambulatory Visit (INDEPENDENT_AMBULATORY_CARE_PROVIDER_SITE_OTHER): Payer: Medicare Other | Admitting: Neurology

## 2013-07-02 ENCOUNTER — Encounter: Payer: Self-pay | Admitting: Neurology

## 2013-07-02 VITALS — BP 130/70 | HR 56 | Resp 16 | Ht 63.5 in | Wt 100.0 lb

## 2013-07-02 DIAGNOSIS — I2510 Atherosclerotic heart disease of native coronary artery without angina pectoris: Secondary | ICD-10-CM

## 2013-07-02 DIAGNOSIS — R413 Other amnesia: Secondary | ICD-10-CM | POA: Diagnosis not present

## 2013-07-02 NOTE — Patient Instructions (Signed)
Mild Neurocognitive Disorder Mild neurocognitive disorder (formerly known as mild cognitive impairment) is a mental disorder. It is a slight abnormal decrease in mental function. The areas of mental function affected may include memory, thought, communication, behavior, and completion of tasks. The decrease is noticeable and measurable but does not interfere substantially with your daily activities. Mild neurocognitive disorder typically occurs in people older than 60 years but can occur earlier. It is not as serious as major neurocognitive disorder (formerly known as dementia) but may lead to a more serious neurocognitive disorder. However, in some cases the condition does not progress. A few people with mild neurocognitive disorder even improve. CAUSES  There are a number of different causes of mild neurocognitive disorder:   Brain disorders associated with abnormal protein deposits, such as Alzheimer disease, Pick disease, and Lewy body disease.  Brain disorders associated with abnormal movement, such as Parkinson disease and Huntington disease.  Diseases affecting blood vessels in the brain and resulting in mini-strokes.  Certain infections such as human immunodeficiency virus (HIV) infection.  Traumatic brain injury.  Other medical conditions such as brain tumors, underactive thyroid (hypothyroidism), and vitamin B12 deficiency.  Use of certain prescription medicine and "recreational" drugs. SYMPTOMS  Symptoms of mild neurocognitive disorder include:  Difficulty remembering You may forget details of recent events, names, or phone numbers. You may forget important social events and appointments or repeatedly forget where you put your car keys.  Difficulty thinking and solving problems You may have difficulty with complex tasks such as paying bills or driving in unfamiliar locations.  Difficulty communicating You may have difficulty finding the right word, naming an object, forming a  sentence that makes sense, or understanding what you read or hear.  Changes in your behavior or personality You may lose interest in the things that you used to enjoy or withdraw from social situations. You may get angry more easily than usual. You may act before thinking. You may do things in public that you would not usually do. You may hear or see things that are not real (hallucinations). You may believe falsely that others are trying to hurt you (paranoia). DIAGNOSIS Mild neurocognitive disorder is diagnosed through an assessment by your health care provider. Your health care provider will ask you and your family, friends, or coworkers questions about your symptoms, their frequency, their duration and progression, and the effect they are having on your life. Your health care provider may refer you to a neurologist or mental health specialist for a detailed evaluation of your mental functions (neuropsychological testing).  To identify the cause of your mild neurocognitive disorder, your health care provider may:  Obtain a detailed medical history.  Ask about alcohol and drug use, including prescription medicine.  Perform a physical exam.  Order blood tests and brain imaging exams. TREATMENT  Mild neurocognitive disorder caused by infections, use of certain medicines or recreational drugs, and certain medical conditions may improve with treatment of the condition that is causing mild neurocognitive disorder. Mild neurocognitive disorder resulting from other causes generally does not improve and may worsen. In these cases, the goal of treatment is to slow progression of the disorder and help you cope with the loss of cognitive function. Treatments in these cases include:   Medicine Medication helps mainly with memory loss and behavioral symptoms.   Talk therapy Talk therapy provides education, emotional support, memory aids, and other ways of compensating for decreases in mental function.    Lifestyle changes These include regular exercise,  a healthy diet (including essential omega-3 fatty acids), intellectual stimulation, and increased social interaction. Document Released: 10/30/2012 Document Reviewed: 10/30/2012 [facility] Patient Information 2014 [place].

## 2013-07-02 NOTE — Progress Notes (Addendum)
[facility]  Provider:  Larey Seat, M D  Referring Provider: Lajean Manes, MD Primary Care Physician:  Mathews Argyle, MD  Chief Complaint  Patient presents with  . New Evaluation    Room 10  . Memory Loss    [place] 19/30    HPI:  [name] is a 80 y.o. female , a retired Holiday representative , and seen here as a referral  from Dr. Felipa Eth for progressive memory loss, primarily anomia.   [name] is a Caucasian, married, right-handed female seen today at the request of Dr. Felipa Eth. She reports that she noted for a period of about 2 years progressive loss of memory.   She describes is a typical for short-term memory loss for example she has trouble finding the name of certain rules or or of people she meets and is  acquainted with. Her husband just noticed over the last couple of months and that she sometimes exchanges the names of 2 neighbors ;Stanton Kidney and [place] Stone. She has also developed some coping strategies; for example she will use a shopping list to make sure that she does not miss any items, and she also has somewhat refrained from conversations out of fear that she would not be able to contribute to be as effective as she would like to be. [name] also as a member of a book club where she feels unpressured and is normally one of the quieter members that she has not felt a limitation to her ability to eloquently  expressing her ideas thoughts and feelings in that comfortable environment. She has not noted any difficulties in phone conversations.  She had trouble finding a measuring cup in her own kitchen. She wasn't sure what restaurant a friend talked about , even that she had been there. Once the friend told her it's location, she knew immediately. She feels troubled by not being able to give directions to some areas of the city, she has lived in for 18 years. Her problem is that she cannot place the names of streets, but if given a visual help  point, she would be able to drive.  No paraphrasia's or neologisms.    Reading books mostly in the evenings, she often has to repeat a chapter to connect to the story again. She is calling her problem " forgetfulness'.  She has mixed up times of appointments , forgot when a friend will call her, even that this has been a routine for many years , 8 AM on Wednesdays.  She  and her husband are empty nesters. Her husband suffered a stroke with residual aphasia 2 years ago.  Her friends tell her the same problems, mostly "things float up" , meaning they have a delay in word recall , too.   Review of Systems:  Out of a complete 14 system review, the patient complains of only the following symptoms, and all other reviewed systems are negative. Memory loss .She has sometimes  chronically 3-4 times a week. No pain problems, No falls, no balance difficulties.      History   Social History  . Marital Status: Married    Spouse Name: [name]    Number of Children: 2  . Years of Education: Masters   Occupational History  . Retired    Social History Main Topics  . Smoking status: Never Smoker   . Smokeless tobacco: Never Used  . Alcohol Use: No  . Drug Use: No  . Sexual Activity: No  Other Topics Concern  . Not on file   Social History Narrative   Patient is married [name]) and lives at home with her husband.   Patient has two children.   Retired; Married;   Patient is right-handed.   Patient drinks very little caffeine.   Patient has a Scientist, water quality.   Has had increased stress as her husband had a stroke ~ 3wks ago and she has been taking care of him. (04/07/11)    Family History  Problem Relation Age of Onset  . Transient ischemic attack Mother   . Stroke Mother   . Cancer Father     PROSTATE  . Pancreatitis Father   . Stroke Sister   . Spina bifida Brother   . Hypertension Brother   . Heart disease Brother   . Hypertension Sister   . Diabetes Sister   . Heart disease  Sister   . Dementia Sister     Past Medical History  Diagnosis Date  . Spinal stenosis   . Anxiety   . GERD (gastroesophageal reflux disease)   . Osteoporosis   . Paroxysmal atrial fibrillation     Coumadin therapy  . Hypertension   . Hyperlipidemia   . CAD (coronary artery disease) 2008/2010    Catheterization 2008, 40% LAD after first diagonal  /  nuclear January, 2010 normal  . Carotid artery disease     Doppler, July, 2011, 0-39% bilateral  . Endometrial polyp   . Atrophic vaginitis   . Vitamin D deficiency   . Stress   . Normal nuclear stress test Feb 2013    No ischemia. Normal wall motion. EF greater than 70%  . Osteopenia   . Palpitations     Monitor, May, 2013, no atrial fib,  one short run of PSVT  . Memory difficulty 01-20-13    "memory issues"  . Osteoarthritis   . Polymyalgia rheumatica   . Irritable bowel syndrome with constipation   . Decreased platelet count 04/2011    142,000    Past Surgical History  Procedure Laterality Date  . Laminectomy  March 2009    L4 through L5  . Cataract surgery  2009  . Tonsillectomy    . Hysteroscopy  02/07/2005    HYST W D&C, DX: PMB W SMALL ENDO POLYP-SURGEON [name]  . Breast surgery      BENIGN BREAST LUMP  . Excision morton's neuroma  2000  . Hip surgery Right 2005    debridement  . Dilation and curettage of uterus    . Colonoscopy with propofol N/A 02/11/2013    Procedure: COLONOSCOPY WITH PROPOFOL;  Surgeon: [name], MD;  Location: [facility];  Service: Endoscopy;  Laterality: N/A;    Current Outpatient Prescriptions  Medication Sig Dispense Refill  . Calcium-Magnesium-Vitamin D (CALCIUM 500 PO) Take 1 tablet by mouth daily.      . cholecalciferol (VITAMIN D) 1000 UNITS tablet Take 1,000 Units by mouth daily.      [name] diltiazem (CARDIZEM CD) 120 MG 24 hr capsule Take 1 capsule (120 mg total) by mouth daily.  30 capsule  3  . metoprolol tartrate (LOPRESSOR) 25 MG tablet Take 25 mg by mouth 2  (two) times daily.      . Multiple Vitamins-Minerals (OCUVITE PRESERVISION) TABS Take 1 each by mouth daily.        . Risedronate Sodium (ATELVIA) 35 MG TBEC Take 1 tablet by mouth once a week.      . simvastatin (ZOCOR)  20 MG tablet TAKE 1 TABLET BY MOUTH EVERY EVENING  30 tablet  5  . warfarin (COUMADIN) 5 MG tablet 1 tablet all days of the week or as directed by coumadin clinic  90 tablet  1  . [DISCONTINUED] estradiol (ESTRACE) 1 MG tablet Take 1 mg by mouth as needed.        No current facility-administered medications for this visit.    Allergies as of 07/02/2013  . (No Known Allergies)    Vitals: BP 130/70  Pulse 56  Resp 16  Ht 5' 3.5" (1.613 m)  Wt 100 lb (45.36 kg)  BMI 17.43 kg/m2 Last Weight:  Wt Readings from Last 1 Encounters:  07/02/13 100 lb (45.36 kg)   Last Height:   Ht Readings from Last 1 Encounters:  07/02/13 5' 3.5" (1.613 m)    Physical exam:  General: The patient is awake, alert and appears not in acute distress. The patient is well groomed. Head: Normocephalic, atraumatic. Neck is supple. Mallampati 2, neck circumference: 14 inches  Cardiovascular:  irregular rate and rhythm- atrial fibrillation , patient reports flutter. no carotid bruit, and without distended neck veins. Respiratory: Lungs are clear to auscultation. Skin:  Without evidence of edema, or rash Trunk: normal posture.  Neurologic exam : The patient is awake and alert, oriented to place and time.  Memory subjective  described as progressively impaired, MOCA here 19-30 (per Dr. Carlyle Lipa chart, her MMSE was 28-11 July 2011) . There is a normal attention span & concentration ability. Speech is fluent without  dysarthria, dysphonia or aphasia. Mood and affect are appropriate. She is quiet and demure, anxious " inside ".   Cranial nerves: Pupils are equal and briskly reactive to light. Funduscopic exam without pallor or edema. Status post cataract.   Extraocular movements  in vertical  and horizontal planes intact , with gaze to the left , her right eyelid closes , with prolonged upward gaze, ptosis is noted on the right.  Visual fields by finger perimetry are intact. Hearing to finger rub intact, tinnitus not objective.  Facial sensation intact to fine touch.  Facial motor strength is symmetric and tongue and uvula move midline.  Motor exam: Normal tone , muscle bulk and symmetric normal strength in all extremities.  Sensory:  Fine touch, pinprick and vibration were tested in all extremities. Proprioception is  normal.  Coordination: Rapid alternating movements in the fingers/hands is tested and normal. Finger-to-nose maneuver  without evidence of ataxia, only mild dysmetria and no tremor.  Gait and station: Patient walks without assistive device . Strength within normal limits. Stance is stable and normal.  Romberg testing is normal. The tandem gait was very unsteady.  Deep tendon reflexes: in the  upper and lower extremities are brisk - symmetric and intact. Babinski maneuver response is downgoing.   Assessment:  After physical and neurologic examination, review of laboratory studies, imaging, neurophysiology testing and pre-existing records, assessment is  1) cognitive impairment in the range of early dementia by [facility], and with her history of atrial fibrillation and the hyperreflexia and eye movement abnormalitiy, suspect she may have a vascular componnent.  I would like to have the patient discuss with Dr. Ron Parker and [place] to change from coumadin to a newer oral anticoagulant with the upcoming antidot. Ordered MRI , non contrast.     60 minute visit with well over 30 minutes of discussion and  A/ Question.

## 2013-07-03 ENCOUNTER — Other Ambulatory Visit (INDEPENDENT_AMBULATORY_CARE_PROVIDER_SITE_OTHER): Payer: Self-pay

## 2013-07-03 DIAGNOSIS — R413 Other amnesia: Secondary | ICD-10-CM | POA: Diagnosis not present

## 2013-07-03 DIAGNOSIS — Z0289 Encounter for other administrative examinations: Secondary | ICD-10-CM

## 2013-07-07 LAB — TSH: TSH: 2.85 u[IU]/mL (ref 0.450–4.500)

## 2013-07-07 LAB — METHYLMALONIC ACID, SERUM: Methylmalonic Acid: 145 nmol/L (ref 0–378)

## 2013-07-10 ENCOUNTER — Ambulatory Visit
Admission: RE | Admit: 2013-07-10 | Discharge: 2013-07-10 | Disposition: A | Discharge status: Home / Self Care | Payer: Medicare Other | Source: Ambulatory Visit | Attending: Neurology | Admitting: Neurology

## 2013-07-10 DIAGNOSIS — R413 Other amnesia: Secondary | ICD-10-CM

## 2013-07-11 DIAGNOSIS — Z961 Presence of intraocular lens: Secondary | ICD-10-CM | POA: Diagnosis not present

## 2013-07-11 DIAGNOSIS — H47329 Drusen of optic disc, unspecified eye: Secondary | ICD-10-CM | POA: Diagnosis not present

## 2013-07-11 DIAGNOSIS — H3530 Unspecified macular degeneration: Secondary | ICD-10-CM | POA: Diagnosis not present

## 2013-07-11 DIAGNOSIS — H04129 Dry eye syndrome of unspecified lacrimal gland: Secondary | ICD-10-CM | POA: Diagnosis not present

## 2013-07-14 NOTE — Progress Notes (Signed)
Quick Note:  Shared normal lab results with husband, verbalized understanding ______

## 2013-07-15 ENCOUNTER — Ambulatory Visit (INDEPENDENT_AMBULATORY_CARE_PROVIDER_SITE_OTHER): Payer: Medicare Other | Admitting: Pharmacist Clinician (PhC)/ Clinical Pharmacy Specialist

## 2013-07-15 DIAGNOSIS — I4891 Unspecified atrial fibrillation: Secondary | ICD-10-CM | POA: Diagnosis not present

## 2013-07-15 DIAGNOSIS — Z5181 Encounter for therapeutic drug level monitoring: Secondary | ICD-10-CM

## 2013-07-15 DIAGNOSIS — Z7901 Long term (current) use of anticoagulants: Secondary | ICD-10-CM | POA: Diagnosis not present

## 2013-07-15 DIAGNOSIS — I480 Paroxysmal atrial fibrillation: Secondary | ICD-10-CM

## 2013-07-15 LAB — POCT INR: INR: 2.5

## 2013-08-08 ENCOUNTER — Telehealth: Payer: Self-pay | Admitting: Cardiology

## 2013-08-08 DIAGNOSIS — J329 Chronic sinusitis, unspecified: Secondary | ICD-10-CM | POA: Diagnosis not present

## 2013-08-08 NOTE — Telephone Encounter (Signed)
New message    Patient Started on antibiotic for sinusitis may effect her PT/ INR.

## 2013-08-08 NOTE — Telephone Encounter (Signed)
Patient starting on a Z-pak today by [facility] physicians.  Patient advised to continue same warfarin dose but to increase vitamin K in diet slightly, and to keep her protime appointment next week.  She was agreeable to this.

## 2013-08-14 ENCOUNTER — Ambulatory Visit (INDEPENDENT_AMBULATORY_CARE_PROVIDER_SITE_OTHER): Payer: Medicare Other | Admitting: *Deleted

## 2013-08-14 DIAGNOSIS — I480 Paroxysmal atrial fibrillation: Secondary | ICD-10-CM

## 2013-08-14 DIAGNOSIS — Z7901 Long term (current) use of anticoagulants: Secondary | ICD-10-CM

## 2013-08-14 DIAGNOSIS — Z5181 Encounter for therapeutic drug level monitoring: Secondary | ICD-10-CM

## 2013-08-14 DIAGNOSIS — I4891 Unspecified atrial fibrillation: Secondary | ICD-10-CM

## 2013-08-14 LAB — POCT INR: INR: 2.6

## 2013-08-18 DIAGNOSIS — Z1231 Encounter for screening mammogram for malignant neoplasm of breast: Secondary | ICD-10-CM | POA: Diagnosis not present

## 2013-08-18 DIAGNOSIS — Z8262 Family history of osteoporosis: Secondary | ICD-10-CM | POA: Diagnosis not present

## 2013-09-22 DIAGNOSIS — K219 Gastro-esophageal reflux disease without esophagitis: Secondary | ICD-10-CM | POA: Diagnosis not present

## 2013-09-22 DIAGNOSIS — Z0000 Encounter for general adult medical examination without abnormal findings: Secondary | ICD-10-CM | POA: Diagnosis not present

## 2013-09-22 DIAGNOSIS — E46 Unspecified protein-calorie malnutrition: Secondary | ICD-10-CM | POA: Diagnosis not present

## 2013-09-22 DIAGNOSIS — I10 Essential (primary) hypertension: Secondary | ICD-10-CM | POA: Diagnosis not present

## 2013-09-22 DIAGNOSIS — Z1331 Encounter for screening for depression: Secondary | ICD-10-CM | POA: Diagnosis not present

## 2013-09-22 DIAGNOSIS — E7800 Pure hypercholesterolemia, unspecified: Secondary | ICD-10-CM | POA: Diagnosis not present

## 2013-09-22 DIAGNOSIS — Z79899 Other long term (current) drug therapy: Secondary | ICD-10-CM | POA: Diagnosis not present

## 2013-09-22 DIAGNOSIS — Z23 Encounter for immunization: Secondary | ICD-10-CM | POA: Diagnosis not present

## 2013-09-25 ENCOUNTER — Ambulatory Visit (INDEPENDENT_AMBULATORY_CARE_PROVIDER_SITE_OTHER): Payer: Medicare Other

## 2013-09-25 DIAGNOSIS — I4891 Unspecified atrial fibrillation: Secondary | ICD-10-CM | POA: Diagnosis not present

## 2013-09-25 DIAGNOSIS — Z5181 Encounter for therapeutic drug level monitoring: Secondary | ICD-10-CM | POA: Diagnosis not present

## 2013-09-25 DIAGNOSIS — Z7901 Long term (current) use of anticoagulants: Secondary | ICD-10-CM

## 2013-09-25 DIAGNOSIS — I480 Paroxysmal atrial fibrillation: Secondary | ICD-10-CM

## 2013-09-25 LAB — POCT INR: INR: 2.7

## 2013-10-02 ENCOUNTER — Encounter: Payer: Self-pay | Admitting: Neurology

## 2013-10-02 ENCOUNTER — Ambulatory Visit (INDEPENDENT_AMBULATORY_CARE_PROVIDER_SITE_OTHER): Payer: Medicare Other | Admitting: Neurology

## 2013-10-02 VITALS — BP 138/67 | HR 54 | Resp 15 | Ht 63.25 in | Wt 100.0 lb

## 2013-10-02 DIAGNOSIS — R413 Other amnesia: Secondary | ICD-10-CM

## 2013-10-02 DIAGNOSIS — I2510 Atherosclerotic heart disease of native coronary artery without angina pectoris: Secondary | ICD-10-CM | POA: Diagnosis not present

## 2013-10-02 DIAGNOSIS — G3184 Mild cognitive impairment, so stated: Secondary | ICD-10-CM

## 2013-10-02 MED ORDER — DONEPEZIL HCL 5 MG PO TABS: 5.0000 mg | ORAL_TABLET | Freq: Every day | ORAL | Status: DC

## 2013-10-02 MED ORDER — MEMANTINE HCL ER 7 & 14 & 21 &28 MG PO CP24: 28.0000 mg | ORAL_CAPSULE | Freq: Every morning | ORAL | Status: DC

## 2013-10-02 NOTE — Progress Notes (Signed)
[facility]  Provider:  Larey Stone, M D  Referring Provider: Lajean Manes, MD Primary Care Physician:  Donna Argyle, MD  Chief Complaint  Patient presents with  . Follow-up    Room 10  . Memory Loss    MMSE- 26/30   MOCA  26/30 - AFT- 15    HPI:  [name] is a 80 y.o. female , a retired Holiday representative , and seen here as a referral  from [name] for progressive memory loss, primarily anomia.   The [facility] are her to discuss the recent test results, TSH and B 12 were normal, her MRI was not. There is peri-sylvian  atrophy and hippocampal atrophy that is frequently associated with dementia of alzheimer's type. She received a copy in the mail after we spoke on the phone and made this follow up appointment.  The patient has begun to use [name] a date computer games and she feels already that there is a comportment in problem solving related to these some special brain training sessions. In addition I was happy to see today that she scored 23 of 30 points on  Montral cognitive assessment test.  The mportant parts with difficulties were the trail making test #1 in the 5 work recall. She was fully oriented to time and place, could repeat sentences and series of numbers and name objects.  She is physically active, has  A moderate diet and no habits of drinking , smoking etc. Her sleep is normal. Her husband noted her continuous repetition of  commends and questions.  We have decided today to start on 5 mg aricept and namenda XR.           Last visit note;   [name] is a Caucasian, married, right-handed female seen today at the request of [name]. She reports that she noted for a period of about 2 years progressive loss of memory.   She describes is a typical for short-term memory loss for example she has trouble finding the name of certain rules or or of people she meets and is  acquainted with. Her husband just noticed  over the last couple of months and that she sometimes exchanges the names of 2 neighbors ;[name] and [name]. She has also developed some coping strategies; for example she will use a shopping list to make sure that she does not miss any items, and she also has somewhat refrained from conversations out of fear that she would not be able to contribute to be as effective as she would like to be. [name] also as a member of a book club where she feels unpressured and is normally one of the quieter members that she has not felt a limitation to her ability to eloquently  expressing her ideas thoughts and feelings in that comfortable environment. She has not noted any difficulties in phone conversations.  She had trouble finding a measuring cup in her own kitchen. She wasn't sure what restaurant a friend talked about , even that she had been there. Once the friend told her it's location, she knew immediately. She feels troubled by not being able to give directions to some areas of the city, she has lived in for 54 years. Her problem is that she cannot place the names of streets, but if given a visual help point, she would be able to drive.  No paraphrasia's or neologisms.    Reading books mostly in the evenings, she often has to repeat  a chapter to connect to the story again. She is calling her problem " forgetfulness'.  She has mixed up times of appointments , forgot when a friend will call her, even that this has been a routine for many years , 8 AM on Wednesdays.  She  and her husband are empty nesters. Her husband suffered a stroke with residual aphasia 2 years ago.  Her friends tell her the same problems, mostly "things float up" , meaning they have a delay in word recall , too.   Review of Systems:  Out of a complete 14 system review, the patient complains of only the following symptoms, and all other reviewed systems are negative. Memory loss .She has sometimes  chronically 3-4 times a week. No  pain problems, No falls, no balance difficulties.      History   Social History  . Marital Status: Married    Spouse Name: [name]    Number of Children: 2  . Years of Education: Masters   Occupational History  . Retired    Social History Main Topics  . Smoking status: Never Smoker   . Smokeless tobacco: Never Used  . Alcohol Use: No  . Drug Use: No  . Sexual Activity: No   Other Topics Concern  . Not on file   Social History Narrative   Patient is married [name]) and lives at home with her husband.   Patient has two children.   Retired; Married;   Patient is right-handed.   Patient drinks very little caffeine.   Patient has a Scientist, water quality.   Has had increased stress as her husband had a stroke ~ 3wks ago and she has been taking care of him. (04/07/11)    Family History  Problem Relation Age of Onset  . Transient ischemic attack Mother   . Stroke Mother   . Cancer Father     PROSTATE  . Pancreatitis Father   . Stroke Sister   . Spina bifida Brother   . Hypertension Brother   . Heart disease Brother   . Hypertension Sister   . Diabetes Sister   . Heart disease Sister   . Dementia Sister     Past Medical History  Diagnosis Date  . Spinal stenosis   . Anxiety   . GERD (gastroesophageal reflux disease)   . Osteoporosis   . Paroxysmal atrial fibrillation     Coumadin therapy  . Hypertension   . Hyperlipidemia   . CAD (coronary artery disease) 2008/2010    Catheterization 2008, 40% LAD after first diagonal  /  nuclear January, 2010 normal  . Carotid artery disease     Doppler, July, 2011, 0-39% bilateral  . Endometrial polyp   . Atrophic vaginitis   . Vitamin D deficiency   . Stress   . Normal nuclear stress test Feb 2013    No ischemia. Normal wall motion. EF greater than 70%  . Osteopenia   . Palpitations     Monitor, May, 2013, no atrial fib,  one short run of PSVT  . Memory difficulty 01-20-13    "memory issues"  . Osteoarthritis   .  Polymyalgia rheumatica   . Irritable bowel syndrome with constipation   . Decreased platelet count 04/2011    142,000    Past Surgical History  Procedure Laterality Date  . Laminectomy  March 2009    L4 through L5  . Cataract surgery  2009  . Tonsillectomy    . Hysteroscopy  02/07/2005  HYST W D&C, DX: PMB W SMALL ENDO POLYP-SURGEON [name]  . Breast surgery      BENIGN BREAST LUMP  . Excision morton's neuroma  2000  . Hip surgery Right 2005    debridement  . Dilation and curettage of uterus    . Colonoscopy with propofol N/A 02/11/2013    Procedure: COLONOSCOPY WITH PROPOFOL;  Surgeon: [name], MD;  Location: [facility];  Service: Endoscopy;  Laterality: N/A;    Current Outpatient Prescriptions  Medication Sig Dispense Refill  . Calcium-Magnesium-Vitamin D (CALCIUM 500 PO) Take 1 tablet by mouth daily.      . cholecalciferol (VITAMIN D) 1000 UNITS tablet Take 1,000 Units by mouth daily.      [name] lisinopril (PRINIVIL,ZESTRIL) 5 MG tablet 1 tablet daily.      . metoprolol tartrate (LOPRESSOR) 25 MG tablet Take 25 mg by mouth 2 (two) times daily.      . Multiple Vitamins-Minerals (OCUVITE PRESERVISION) TABS Take 1 each by mouth daily.        . Risedronate Sodium (ATELVIA) 35 MG TBEC Take 1 tablet by mouth once a week.      . simvastatin (ZOCOR) 20 MG tablet TAKE 1 TABLET BY MOUTH EVERY EVENING  30 tablet  5  . warfarin (COUMADIN) 5 MG tablet 1 tablet all days of the week or as directed by coumadin clinic  90 tablet  1  . [DISCONTINUED] estradiol (ESTRACE) 1 MG tablet Take 1 mg by mouth as needed.        No current facility-administered medications for this visit.    Allergies as of 10/02/2013  . (No Known Allergies)    Vitals: BP 138/67  Pulse 54  Resp 15  Ht 5' 3.25" (1.607 m)  Wt 100 lb (45.36 kg)  BMI 17.56 kg/m2 Last Weight:  Wt Readings from Last 1 Encounters:  10/02/13 100 lb (45.36 kg)   Last Height:   Ht Readings from Last 1 Encounters:   10/02/13 5' 3.25" (1.607 m)    Physical exam:  General: The patient is awake, alert and appears not in acute distress. The patient is well groomed. Head: Normocephalic, atraumatic. Neck is supple. Mallampati 2, neck circumference: 14 inches  Cardiovascular:  irregular rate and rhythm- atrial fibrillation , patient reports flutter. no carotid bruit, and without distended neck veins. Respiratory: Lungs are clear to auscultation. Skin:  Without evidence of edema, or rash Trunk: normal posture.  Neurologic exam : The patient is awake and alert, oriented to place and time.  Memory subjective  described as progressively impaired, MOCA here 19-30 (per Dr. Carlyle Lipa chart, her MMSE was 28-11 July 2011) . There is a normal attention span & concentration ability. Speech is fluent without  dysarthria, dysphonia or aphasia. Mood and affect are appropriate. She is quiet and demure, anxious " inside ".   Cranial nerves: Pupils are equal and briskly reactive to light. Funduscopic exam without pallor or edema. Status post cataract.   Extraocular movements  in vertical and horizontal planes intact , with gaze to the left , her right eyelid closes , with prolonged upward gaze, ptosis is noted on the right.  Visual fields by finger perimetry are intact. Hearing to finger rub intact, tinnitus not objective.  Facial sensation intact to fine touch.  Facial motor strength is symmetric and tongue and uvula move midline.  Motor exam: Normal tone , muscle bulk and symmetric normal strength in all extremities. Sensory:  Fine touch, pinprick and vibration were tested  in all extremities. Coordination: Rapid alternating movements in the fingers/hands is tested and normal. Finger-to-nose maneuver  without evidence of ataxia, only mild dysmetria and no tremor. Gait and station: Patient walks without assistive device . Deep tendon reflexes: in the  upper and lower extremities are brisk - symmetric and intact. Babinski  maneuver response is downgoing.   Assessment:  After physical and neurologic examination, review of laboratory studies, imaging, neurophysiology testing and pre-existing records, assessment is  1) cognitive impairment in the range of early dementia by MOCA,  And no vascular component by MRI but hippocampal atrophy. , MOCA score today suggests more mild cognitive impairment.    PLAN start medication, excercise regularly.

## 2013-10-02 NOTE — Patient Instructions (Signed)
Donepezil tablets What is this medicine? DONEPEZIL (doe NEP e zil) is used to treat mild to moderate dementia caused by Alzheimer's disease. This medicine may be used for other purposes; ask your health care provider or pharmacist if you have questions. COMMON BRAND NAME(S): Aricept What should I tell my health care provider before I take this medicine? They need to know if you have any of these conditions: -asthma or other lung disease -difficulty passing urine -head injury -heart disease -history of irregular heartbeat -liver disease -seizures (convulsions) -stomach or intestinal disease, ulcers or stomach bleeding -an unusual or allergic reaction to donepezil, other medicines, foods, dyes, or preservatives -pregnant or trying to get pregnant -breast-feeding How should I use this medicine? Take this medicine by mouth with a glass of water. Follow the directions on the prescription label. You may take this medicine with or without food. Take this medicine at regular intervals. This medicine is usually taken before bedtime. Do not take it more often than directed. Continue to take your medicine even if you feel better. Do not stop taking except on your doctor's advice. If you are taking the 23 mg donepezil tablet, swallow it whole; do not cut, crush, or chew it. Talk to your pediatrician regarding the use of this medicine in children. Special care may be needed. Overdosage: If you think you have taken too much of this medicine contact a poison control center or emergency room at once. NOTE: This medicine is only for you. Do not share this medicine with others. What if I miss a dose? If you miss a dose, take it as soon as you can. If it is almost time for your next dose, take only that dose, do not take double or extra doses. What may interact with this medicine? Do not take this medicine with any of the following medications: -certain medicines for fungal infections like itraconazole,  fluconazole, posaconazole, and voriconazole -cisapride -dextromethorphan; quinidine -dofetilide -dronedarone -pimozide -quinidine -thioridazine -ziprasidone This medicine may also interact with the following medications: -antihistamines for allergy, cough and cold -atropine -bethanechol -carbamazepine -certain medicines for bladder problems like oxybutynin, tolterodine -certain medicines for Parkinson's disease like benztropine, trihexyphenidyl -certain medicines for stomach problems like dicyclomine, hyoscyamine -certain medicines for travel sickness like scopolamine -dexamethasone -ipratropium -NSAIDs, medicines for pain and inflammation, like ibuprofen or naproxen -other medicines for Alzheimer's disease -other medicines that prolong the QT interval (cause an abnormal heart rhythm) -phenobarbital -phenytoin -rifampin, rifabutin or rifapentine This list may not describe all possible interactions. Give your health care provider a list of all the medicines, herbs, non-prescription drugs, or dietary supplements you use. Also tell them if you smoke, drink alcohol, or use illegal drugs. Some items may interact with your medicine. What should I watch for while using this medicine? Visit your doctor or health care professional for regular checks on your progress. Check with your doctor or health care professional if your symptoms do not get better or if they get worse. You may get drowsy or dizzy. Do not drive, use machinery, or do anything that needs mental alertness until you know how this drug affects you. What side effects may I notice from receiving this medicine? Side effects that you should report to your doctor or health care professional as soon as possible: -allergic reactions like skin rash, itching or hives, swelling of the face, lips, or tongue -changes in vision -feeling faint or lightheaded, falls -problems with balance -slow heartbeat, or palpitations -stomach  pain -unusual bleeding or bruising,  red or purple spots on the skin -vomiting -weight loss Side effects that usually do not require medical attention (report to your doctor or health care professional if they continue or are bothersome): -diarrhea, especially when starting treatment -headache -indigestion or heartburn -loss of appetite -muscle cramps -nausea This list may not describe all possible side effects. Call your doctor for medical advice about side effects. You may report side effects to FDA at 1-800-FDA-1088. Where should I keep my medicine? Keep out of reach of children. Store at room temperature between 15 and 30 degrees C (59 and 86 degrees F). Throw away any unused medicine after the expiration date. NOTE: This sheet is a summary. It may not cover all possible information. If you have questions about this medicine, talk to your doctor, pharmacist, or health care provider.  2015, Elsevier/Gold Standard. (2013-06-16 21:44:11) Memantine extended release capsules What is this medicine? MEMANTINE (MEM an teen) is used to treat dementia caused by Alzheimer's disease. This medicine may be used for other purposes; ask your health care provider or pharmacist if you have questions. COMMON BRAND NAME(S): Namenda XR What should I tell my health care provider before I take this medicine? They need to know if you have any of these conditions: -difficulty passing urine -kidney disease -liver disease -seizures -an unusual or allergic reaction to memantine, other medicines, foods, dyes, or preservatives -pregnant or trying to get pregnant -breast-feeding How should I use this medicine? Take this medicine by mouth with a glass of water. Follow the directions on the prescription label. You may take this medicine with or without food. You may swallow the capsules whole or open them and sprinkle the entire contents on applesauce before swallowing. Other than sprinkling the medicine on  applesauce, the capsules should be swallowed whole and not divided, chewed, or crushed. Take your doses at regular intervals. Do not take your medicine more often than directed. Continue to take your medicine even if you feel better. Do not stop taking except on the advice of your doctor or health care professional. Talk to your pediatrician regarding the use of this medicine in children. Special care may be needed. Overdosage: If you think you've taken too much of this medicine contact a poison control center or emergency room at once. Overdosage: If you think you have taken too much of this medicine contact a poison control center or emergency room at once. NOTE: This medicine is only for you. Do not share this medicine with others. What if I miss a dose? If you miss a dose, take it as soon as you can. If it is almost time for your next dose, take only that dose. Do not take double or extra doses. If you do not take your medicine for several days, contact your health care provider. Your dose may need to be changed. What may interact with this medicine? -acetazolamide -amantadine -cimetidine -dextromethorphan -dofetilide -hydrochlorothiazide -ketamine -metformin -methazolamide -quinidine -ranitidine -sodium bicarbonate -triamterene This list may not describe all possible interactions. Give your health care provider a list of all the medicines, herbs, non-prescription drugs, or dietary supplements you use. Also tell them if you smoke, drink alcohol, or use illegal drugs. Some items may interact with your medicine. What should I watch for while using this medicine? Visit your doctor or health care professional for regular checks on your progress. Check with your doctor or health care professional if there is no improvement in your symptoms or if they get worse. You may get drowsy  or dizzy. Do not drive, use machinery, or do anything that needs mental alertness until you know how this drug  affects you. Do not stand or sit up quickly, especially if you are an older patient. This reduces the risk of dizzy or fainting spells. Alcohol can make you more drowsy and dizzy. Avoid alcoholic drinks. What side effects may I notice from receiving this medicine? Side effects that you should report to your doctor or health care professional as soon as possible: -agitation or a feeling of restlessness -allergic reactions like skin rash, itching or hives, swelling of the face, lips, or tongue -depressed mood -dizziness -hallucinations -redness, blistering, peeling or loosening of the skin, including inside the mouth -seizures -vomiting Side effects that usually do not require medical attention (Report these to your doctor or health care professional if they continue or are bothersome.): -constipation -diarrhea -headache -nausea -trouble sleeping This list may not describe all possible side effects. Call your doctor for medical advice about side effects. You may report side effects to FDA at 1-800-FDA-1088. Where should I keep my medicine? Keep out of the reach of children. Store at room temperature between 15 degrees and 30 degrees C (59 degrees and 86 degrees F). Throw away any unused medicine after the expiration date. NOTE: This sheet is a summary. It may not cover all possible information. If you have questions about this medicine, talk to your doctor, pharmacist, or health care provider.  2015, Elsevier/Gold Standard. (2012-12-16 13:59:18)

## 2013-10-10 ENCOUNTER — Telehealth: Payer: Self-pay | Admitting: Neurology

## 2013-10-10 NOTE — Telephone Encounter (Signed)
I called the pharmacy.  Spoke with the pharmacist who said they have the Rx, however they have not been able to get the starter pack in stock due to back order.  They are expected to have it on Monday.  I called the patient back.  She is aware.

## 2013-10-10 NOTE — Telephone Encounter (Signed)
[name] DOB Feb 14, 2034 is calling--patient saw Dr. Brett Fairy on 7-23 and Rx for Memantine was to be called to [facility] says they have no record of Rx--thank you.

## 2013-10-30 ENCOUNTER — Ambulatory Visit: Payer: Medicare Other | Admitting: Adult Health

## 2013-11-06 ENCOUNTER — Ambulatory Visit (INDEPENDENT_AMBULATORY_CARE_PROVIDER_SITE_OTHER): Payer: Medicare Other

## 2013-11-06 DIAGNOSIS — Z5181 Encounter for therapeutic drug level monitoring: Secondary | ICD-10-CM

## 2013-11-06 DIAGNOSIS — Z7901 Long term (current) use of anticoagulants: Secondary | ICD-10-CM | POA: Diagnosis not present

## 2013-11-06 DIAGNOSIS — I4891 Unspecified atrial fibrillation: Secondary | ICD-10-CM | POA: Diagnosis not present

## 2013-11-06 DIAGNOSIS — H903 Sensorineural hearing loss, bilateral: Secondary | ICD-10-CM | POA: Diagnosis not present

## 2013-11-06 DIAGNOSIS — J309 Allergic rhinitis, unspecified: Secondary | ICD-10-CM | POA: Diagnosis not present

## 2013-11-06 DIAGNOSIS — H905 Unspecified sensorineural hearing loss: Secondary | ICD-10-CM | POA: Diagnosis not present

## 2013-11-06 DIAGNOSIS — I480 Paroxysmal atrial fibrillation: Secondary | ICD-10-CM

## 2013-11-06 DIAGNOSIS — R070 Pain in throat: Secondary | ICD-10-CM | POA: Diagnosis not present

## 2013-11-06 LAB — POCT INR: INR: 1.8

## 2013-11-07 DIAGNOSIS — N952 Postmenopausal atrophic vaginitis: Secondary | ICD-10-CM | POA: Diagnosis not present

## 2013-11-07 DIAGNOSIS — Z124 Encounter for screening for malignant neoplasm of cervix: Secondary | ICD-10-CM | POA: Diagnosis not present

## 2013-11-07 DIAGNOSIS — M810 Age-related osteoporosis without current pathological fracture: Secondary | ICD-10-CM | POA: Diagnosis not present

## 2013-11-21 ENCOUNTER — Encounter: Payer: Self-pay | Admitting: Neurology

## 2013-11-21 ENCOUNTER — Ambulatory Visit (INDEPENDENT_AMBULATORY_CARE_PROVIDER_SITE_OTHER): Payer: Medicare Other | Admitting: Neurology

## 2013-11-21 VITALS — BP 178/80 | HR 68 | Ht 62.0 in | Wt 100.3 lb

## 2013-11-21 DIAGNOSIS — R413 Other amnesia: Secondary | ICD-10-CM | POA: Diagnosis not present

## 2013-11-21 DIAGNOSIS — I679 Cerebrovascular disease, unspecified: Secondary | ICD-10-CM | POA: Diagnosis not present

## 2013-11-21 DIAGNOSIS — I2510 Atherosclerotic heart disease of native coronary artery without angina pectoris: Secondary | ICD-10-CM | POA: Diagnosis not present

## 2013-11-21 DIAGNOSIS — G3184 Mild cognitive impairment, so stated: Secondary | ICD-10-CM | POA: Diagnosis not present

## 2013-11-21 MED ORDER — DONEPEZIL HCL 10 MG PO TABS: 10.0000 mg | ORAL_TABLET | Freq: Every day | ORAL | Status: DC

## 2013-11-21 NOTE — Progress Notes (Signed)
NEUROLOGY CONSULTATION NOTE  [name] MRN: [id number] DOB: 1933-06-30  Referring provider: Dr. Felipa Eth Primary care provider: Dr. Felipa Eth  Reason for consult:  Memory deficits.  HISTORY OF PRESENT ILLNESS: [name] is a 80 year old right-handed woman with history of paroxysmal atrial fibrillation (on anticoagulation), hypertension, hyperlipidemia, CAD, polymyalgia rheumatic, depression, IBS, and arthritis who presents for mild cognitive impairment.  Records and images reviewed.  She is a retired Education officer, museum.  She began noticing gradual memory deficits over the past 2 years.  At first, she would forget appointments.  She would need to use a calendar to remember to keep phone appointments with friends that she had for 6-8 years.  She tends to look at the calendar 3 or 4 times a day to make sure she doesn't miss an appointment.  She began misplacing objects around the house.  She began forgetting recipes that she used for many years.  She has lived in [place] for over 60 years.  Sometimes when somebody talks to her about a specific location, she has to stop and try to remember where it is.  On rare occasions, she has gotten disoriented while driving familiar routes.  More recently, she began forgetting names of acquaintances and it would take a while before she remembers.  She enjoys reading, but often she will have to re-read chapters because she forgot the content.  She belongs to a book club and has become less vocal because she cannot remember the story as much anymore.  When she is in the kitchen, she sometimes forgets where she keeps the pots or pans.  She has also begun forgetting to take her medication, so her husband has to remind her.  Her husband manages the finances, but she does write checks and pay some bills and has not had any difficulty with that.  She was given a diagnosis of early Alzheimer's recently, and has been anxious and scared since that time.  She  easily cries but she says it is due to this news and not because of depression.  She denies hallucinations or delusions.  She usually sleeps well.  She was recently started on both Aricept and Namenda, and she feels more groggy.  Both her mother and sister had dementia.    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  TSH was 2.850 and methylmalonic acid level was 145.  Medications include:  Coumadin, Lisinopril, melatonin 5mg , Zocor 20mg , Prilosec, Atelvia 35mg , D3 1000 IU, B12 500mg , Vit E.  09/22/13 LABS:  LDL 85  PAST MEDICAL HISTORY: Past Medical History  Diagnosis Date  . Spinal stenosis   . Anxiety   . GERD (gastroesophageal reflux disease)   . Osteoporosis   . Paroxysmal atrial fibrillation     Coumadin therapy  . Hypertension   . Hyperlipidemia   . CAD (coronary artery disease) 2008/2010    Catheterization 2008, 40% LAD after first diagonal  /  nuclear January, 2010 normal  . Carotid artery disease     Doppler, July, 2011, 0-39% bilateral  . Endometrial polyp   . Atrophic vaginitis   . Vitamin D deficiency   . Stress   . Normal nuclear stress test Feb 2013    No ischemia. Normal wall motion. EF greater than 70%  . Osteopenia   . Palpitations     Monitor, May, 2013, no atrial  fib,  one short run of PSVT  . Memory difficulty 01-20-13    "memory issues"  . Osteoarthritis   . Polymyalgia rheumatica   . Irritable bowel syndrome with constipation   . Decreased platelet count 04/2011    142,000    PAST SURGICAL HISTORY: Past Surgical History  Procedure Laterality Date  . Laminectomy  March 2009    L4 through L5  . Cataract surgery  2009  . Tonsillectomy    . Hysteroscopy  02/07/2005    HYST W D&C, DX: PMB W SMALL ENDO POLYP-SURGEON [name]  . Breast surgery      BENIGN BREAST LUMP  . Excision  morton's neuroma  2000  . Hip surgery Right 2005    debridement  . Dilation and curettage of uterus    . Colonoscopy with propofol N/A 02/11/2013    Procedure: COLONOSCOPY WITH PROPOFOL;  Surgeon: [name], MD;  Location: [facility];  Service: Endoscopy;  Laterality: N/A;    MEDICATIONS: Current Outpatient Prescriptions on File Prior to Visit  Medication Sig Dispense Refill  . Calcium-Magnesium-Vitamin D (CALCIUM 500 PO) Take 1 tablet by mouth daily.      . cholecalciferol (VITAMIN D) 1000 UNITS tablet Take 1,000 Units by mouth daily.      [name] lisinopril (PRINIVIL,ZESTRIL) 5 MG tablet 1 tablet daily.      . metoprolol tartrate (LOPRESSOR) 25 MG tablet Take 25 mg by mouth 2 (two) times daily.      . Multiple Vitamins-Minerals (OCUVITE PRESERVISION) TABS Take 1 each by mouth daily.        [name] warfarin (COUMADIN) 5 MG tablet 1 tablet all days of the week or as directed by coumadin clinic  90 tablet  1  . [DISCONTINUED] estradiol (ESTRACE) 1 MG tablet Take 1 mg by mouth as needed.        No current facility-administered medications on file prior to visit.    ALLERGIES: No Known Allergies  FAMILY HISTORY: Family History  Problem Relation Age of Onset  . Transient ischemic attack Mother   . Stroke Mother   . Cancer Father     PROSTATE  . Pancreatitis Father   . Stroke Sister   . Spina bifida Brother   . Hypertension Brother   . Heart disease Brother   . Hypertension Sister   . Diabetes Sister   . Heart disease Sister   . Dementia Sister     SOCIAL HISTORY: History   Social History  . Marital Status: Married    Spouse Name: Jeneen Rinks    Number of Children: 2  . Years of Education: Masters   Occupational History  . Retired    Social History Main Topics  . Smoking status: Never Smoker   . Smokeless tobacco: Never Used  . Alcohol Use: No  . Drug Use: No  . Sexual Activity: No   Other Topics Concern  . Not on file   Social History Narrative   Patient is married  Jeneen Rinks) and lives at home with her husband.   Patient has two children.   Retired; Married;   Patient is right-handed.   Patient drinks very little caffeine.   Patient has a Scientist, water quality.   Has had increased stress as her husband had a stroke ~ 3wks ago and she has been taking care of him. (04/07/11)    REVIEW OF SYSTEMS: Constitutional: No fevers, chills, or sweats, no generalized fatigue, change in appetite Eyes: No visual changes, double vision, eye pain  Ear, nose and throat: No hearing loss, ear pain, nasal congestion, sore throat Cardiovascular: No chest pain, palpitations Respiratory:  No shortness of breath at rest or with exertion, wheezes GastrointestinaI: No nausea, vomiting, diarrhea, abdominal pain, fecal incontinence Genitourinary:  No dysuria, urinary retention or frequency Musculoskeletal:  No neck pain, back pain Integumentary: No rash, pruritus, skin lesions Neurological: as above Psychiatric: No depression, insomnia, anxiety Endocrine: No palpitations, fatigue, diaphoresis, mood swings, change in appetite, change in weight, increased thirst Hematologic/Lymphatic:  No anemia, purpura, petechiae. Allergic/Immunologic: no itchy/runny eyes, nasal congestion, recent allergic reactions, rashes  PHYSICAL EXAM: Filed Vitals:   11/21/13 1250  BP: 178/80  Pulse: 68   General: No acute distress Head:  Normocephalic/atraumatic Neck: supple, no paraspinal tenderness, full range of motion Back: No paraspinal tenderness Heart: regular rate and rhythm Lungs: Clear to auscultation bilaterally. Vascular: No carotid bruits. Neurological Exam: Mental status: alert and oriented to person, place, and time, delayed recall poor, but remote memory intact, fund of knowledge intact, attention and concentration intact. Montreal Cognitive Assessment  11/21/2013  Visuospatial/ Executive (0/5) 3  Naming (0/3) 3  Attention: Read list of digits (0/2) 2  Attention: Read list of letters  (0/1) 1  Attention: Serial 7 subtraction starting at 100 (0/3) 3  Language: Repeat phrase (0/2) 2  Language : Fluency (0/1) 1  Abstraction (0/2) 1  Delayed Recall (0/5) 0  Orientation (0/6) 5  Total 21  Adjusted Score (based on education) 21  speech fluent and not dysarthric, language intact. Cranial nerves: CN I: not tested CN II: pupils equal, round and reactive to light, visual fields intact, fundi not visualized CN III, IV, VI:  full range of motion, no nystagmus, no ptosis CN V: facial sensation intact CN VII: upper and lower face symmetric CN VIII: hearing intact CN IX, X: gag intact, uvula midline CN XI: sternocleidomastoid and trapezius muscles intact CN XII: tongue midline Bulk & Tone: normal, no fasciculations. Motor: 5/5 throughout Sensation: pinprick intact.  Reduced vibration in the right foot (related to prior surgeries) Deep Tendon Reflexes: 2+ throughout, toes downgoing. Finger to nose testing: no dysmetria Heel to shin: no dysmetria Gait: normal station and stride.  Able to turn and walk in tandem. Romberg negative.  IMPRESSION: Mild cognitive impairment, primarily of the amnestic type Cerebrovascular disease  PLAN: 1.  Will continue Aricept 10mg  at bedtime but will discontinue Namenda for now.  Hopefully, she will become less groggy 2.  Encouraged to continue reading and partaking in book club and socializing with friends. 3.  Exercise and healthy diet. 4.  Provided numbers of support groups and web sites. 5.  Optimize stroke risk factors:  anticoagulation, blood pressure control, cholesterol (LDL at goal of less than 100) 6.  Follow up in 6 months or as needed.  Thank you for allowing me to take part in the care of this patient.  Metta Clines, DO  CC:  Lajean Manes, MD

## 2013-11-21 NOTE — Patient Instructions (Addendum)
1.  Continue donepezil (Aricept) 10mg  at bedtime.  You may stop the Namenda. 2.  Remain active with the book club and socializing with friends. 3.  Exercise and eat healthy 4.  Follow up in 6 months or as needed.

## 2013-12-04 ENCOUNTER — Ambulatory Visit (INDEPENDENT_AMBULATORY_CARE_PROVIDER_SITE_OTHER): Payer: Medicare Other

## 2013-12-04 DIAGNOSIS — Z7901 Long term (current) use of anticoagulants: Secondary | ICD-10-CM | POA: Diagnosis not present

## 2013-12-04 DIAGNOSIS — Z5181 Encounter for therapeutic drug level monitoring: Secondary | ICD-10-CM

## 2013-12-04 DIAGNOSIS — I480 Paroxysmal atrial fibrillation: Secondary | ICD-10-CM

## 2013-12-04 DIAGNOSIS — I4891 Unspecified atrial fibrillation: Secondary | ICD-10-CM | POA: Diagnosis not present

## 2013-12-04 LAB — POCT INR: INR: 2.4

## 2013-12-10 ENCOUNTER — Other Ambulatory Visit: Payer: Self-pay | Admitting: Cardiology

## 2013-12-11 DIAGNOSIS — Z23 Encounter for immunization: Secondary | ICD-10-CM | POA: Diagnosis not present

## 2013-12-19 ENCOUNTER — Encounter: Payer: Self-pay | Admitting: Cardiology

## 2013-12-19 ENCOUNTER — Ambulatory Visit (INDEPENDENT_AMBULATORY_CARE_PROVIDER_SITE_OTHER): Payer: Medicare Other | Admitting: Cardiology

## 2013-12-19 VITALS — BP 130/46 | HR 63 | Ht 62.0 in | Wt 101.4 lb

## 2013-12-19 DIAGNOSIS — I2510 Atherosclerotic heart disease of native coronary artery without angina pectoris: Secondary | ICD-10-CM | POA: Diagnosis not present

## 2013-12-19 DIAGNOSIS — I779 Disorder of arteries and arterioles, unspecified: Secondary | ICD-10-CM

## 2013-12-19 DIAGNOSIS — I480 Paroxysmal atrial fibrillation: Secondary | ICD-10-CM

## 2013-12-19 DIAGNOSIS — I10 Essential (primary) hypertension: Secondary | ICD-10-CM | POA: Diagnosis not present

## 2013-12-19 DIAGNOSIS — R002 Palpitations: Secondary | ICD-10-CM

## 2013-12-19 DIAGNOSIS — I739 Peripheral vascular disease, unspecified: Secondary | ICD-10-CM

## 2013-12-19 MED ORDER — DILTIAZEM HCL ER COATED BEADS 120 MG PO CP24: 120.0000 mg | ORAL_CAPSULE | Freq: Every day | ORAL | Status: DC

## 2013-12-19 NOTE — Assessment & Plan Note (Signed)
Blood pressures control. No change in therapy. 

## 2013-12-19 NOTE — Assessment & Plan Note (Signed)
She has a history of mild coronary disease. She's not having any significant symptoms. No further testing is needed.

## 2013-12-19 NOTE — Assessment & Plan Note (Signed)
Historically there was very slight carotid disease. When I see her next we will decide if we should do a followup carotid Doppler.

## 2013-12-19 NOTE — Patient Instructions (Signed)
**Note De-Identified  Obfuscation** Your physician has recommended you make the following change in your medication: start taking Diltiazem 120 mg daily  Your physician wants you to follow-up in: 6 months. You will receive a reminder letter in the mail two months in advance. If you don't receive a letter, please call our office to schedule the follow-up appointment.

## 2013-12-19 NOTE — Assessment & Plan Note (Signed)
Atrial fibrillation has been documented in the past. She is anticoagulated.

## 2013-12-19 NOTE — Progress Notes (Signed)
Patient ID: [name], female   DOB: 26-Apr-1933, 80 y.o.   MRN: [id number]    HPI  Patient is seen to followup palpitations. She is anxious. We know that she has had some atrial fibrillation over time. She is anticoagulated. I had her on a small dose of diltiazem. She read about the medication and decided to stop on her own. She was not having any significant side effects from the medicine.  No Known Allergies  Current Outpatient Prescriptions  Medication Sig Dispense Refill  . CALCIUM PO Take by mouth as directed.      . Calcium-Magnesium-Vitamin D (CALCIUM 500 PO) Take 1 tablet by mouth daily.      . Cyanocobalamin (VITAMIN B-12 PO) Take by mouth as directed.      . donepezil (ARICEPT) 10 MG tablet Take 1 tablet (10 mg total) by mouth at bedtime.  30 tablet  3  . lisinopril (PRINIVIL,ZESTRIL) 5 MG tablet 1 tablet daily.      [name] MELATONIN PO Take by mouth at bedtime as needed and may repeat dose one time if needed (FOR SLEEP).       . metoprolol tartrate (LOPRESSOR) 25 MG tablet Take 25 mg by mouth 2 (two) times daily.      . Multiple Vitamins-Minerals (OCUVITE PRESERVISION) TABS Take 1 each by mouth daily.        [name] warfarin (COUMADIN) 5 MG tablet Take as directed by anticoagulation clinic  90 tablet  1  . ipratropium (ATROVENT) 0.06 % nasal spray Place into both nostrils every other day.       . [DISCONTINUED] estradiol (ESTRACE) 1 MG tablet Take 1 mg by mouth as needed.        No current facility-administered medications for this visit.    History   Social History  . Marital Status: Married    Spouse Name: Jeneen Rinks    Number of Children: 2  . Years of Education: Masters   Occupational History  . Retired    Social History Main Topics  . Smoking status: Never Smoker   . Smokeless tobacco: Never Used  . Alcohol Use: No  . Drug Use: No  . Sexual Activity: No   Other Topics Concern  . Not on file   Social History Narrative   Patient is married Jeneen Rinks) and lives at home  with her husband.   Patient has two children.   Retired; Married;   Patient is right-handed.   Patient drinks very little caffeine.   Patient has a Scientist, water quality.   Has had increased stress as her husband had a stroke ~ 3wks ago and she has been taking care of him. (04/07/11)    Family History  Problem Relation Age of Onset  . Transient ischemic attack Mother   . Stroke Mother   . Cancer Father     PROSTATE  . Pancreatitis Father   . Stroke Sister   . Spina bifida Brother   . Hypertension Brother   . Heart disease Brother   . Hypertension Sister   . Diabetes Sister   . Heart disease Sister   . Dementia Sister     Past Medical History  Diagnosis Date  . Spinal stenosis   . Anxiety   . GERD (gastroesophageal reflux disease)   . Osteoporosis   . Paroxysmal atrial fibrillation     Coumadin therapy  . Hypertension   . Hyperlipidemia   . CAD (coronary artery disease) 2008/2010    Catheterization 2008, 40%  LAD after first diagonal  /  nuclear January, 2010 normal  . Carotid artery disease     Doppler, July, 2011, 0-39% bilateral  . Endometrial polyp   . Atrophic vaginitis   . Vitamin D deficiency   . Stress   . Normal nuclear stress test Feb 2013    No ischemia. Normal wall motion. EF greater than 70%  . Osteopenia   . Palpitations     Monitor, May, 2013, no atrial fib,  one short run of PSVT  . Memory difficulty 01-20-13    "memory issues"  . Osteoarthritis   . Polymyalgia rheumatica   . Irritable bowel syndrome with constipation   . Decreased platelet count 04/2011    142,000    Past Surgical History  Procedure Laterality Date  . Laminectomy  March 2009    L4 through L5  . Cataract surgery  2009  . Tonsillectomy    . Hysteroscopy  02/07/2005    HYST W D&C, DX: PMB W SMALL ENDO POLYP-SURGEON [name]  . Breast surgery      BENIGN BREAST LUMP  . Excision morton's neuroma  2000  . Hip surgery Right 2005    debridement  . Dilation and curettage of uterus     . Colonoscopy with propofol N/A 02/11/2013    Procedure: COLONOSCOPY WITH PROPOFOL;  Surgeon: [name], MD;  Location: [facility];  Service: Endoscopy;  Laterality: N/A;    Patient Active Problem List   Diagnosis Date Noted  . Amnestic MCI (mild cognitive impairment with memory loss) 11/21/2013  . Memory loss or impairment 07/02/2013  . Encounter for therapeutic drug monitoring 04/25/2013  . Palpitations   . Osteopenia   . Chest pain 04/07/2011  . Spinal stenosis   . Anxiety   . Warfarin anticoagulation   . GERD (gastroesophageal reflux disease)   . Osteoporosis   . Paroxysmal atrial fibrillation   . Hypertension   . Hyperlipidemia   . Carotid artery disease   . CAD (coronary artery disease)   . Ejection fraction     ROS   Patient denies fever, chills, headache, sweats, rash, change in vision, change in hearing, chest pain, cough, nausea vomiting, urinary symptoms. All other systems are reviewed and are negative.  PHYSICAL EXAM  Patient is stable. She is mildly anxious. She is oriented to person time and place. Head is atraumatic. Sclera and conjunctiva are normal. There is no jugulovenous distention. Lungs are clear. Respiratory effort is nonlabored. Cardiac exam reveals S1 and S2. Abdomen is soft. There is no peripheral edema. There no musculoskeletal deformities. There are no skin rashes.  Filed Vitals:   12/19/13 0947  BP: 130/46  Pulse: 63  Height: 5\' 2"  (1.575 m)  Weight: 101 lb 6.4 oz (45.995 kg)  SpO2: 99%     ASSESSMENT & PLAN

## 2013-12-19 NOTE — Assessment & Plan Note (Signed)
She continues to have some mild palpitations. After discussion today she wants to try a low-dose diltiazem again.

## 2014-01-01 ENCOUNTER — Ambulatory Visit (INDEPENDENT_AMBULATORY_CARE_PROVIDER_SITE_OTHER): Payer: Medicare Other | Admitting: Pharmacist Clinician (PhC)/ Clinical Pharmacy Specialist

## 2014-01-01 DIAGNOSIS — I4891 Unspecified atrial fibrillation: Secondary | ICD-10-CM

## 2014-01-01 DIAGNOSIS — Z7901 Long term (current) use of anticoagulants: Secondary | ICD-10-CM | POA: Diagnosis not present

## 2014-01-01 DIAGNOSIS — Z5181 Encounter for therapeutic drug level monitoring: Secondary | ICD-10-CM | POA: Diagnosis not present

## 2014-01-01 DIAGNOSIS — I480 Paroxysmal atrial fibrillation: Secondary | ICD-10-CM

## 2014-01-01 LAB — POCT INR: INR: 4.5

## 2014-01-12 ENCOUNTER — Encounter: Payer: Self-pay | Admitting: Cardiology

## 2014-01-13 ENCOUNTER — Ambulatory Visit (INDEPENDENT_AMBULATORY_CARE_PROVIDER_SITE_OTHER): Payer: Medicare Other | Admitting: *Deleted

## 2014-01-13 DIAGNOSIS — Z5181 Encounter for therapeutic drug level monitoring: Secondary | ICD-10-CM | POA: Diagnosis not present

## 2014-01-13 DIAGNOSIS — I4891 Unspecified atrial fibrillation: Secondary | ICD-10-CM

## 2014-01-13 DIAGNOSIS — Z7901 Long term (current) use of anticoagulants: Secondary | ICD-10-CM

## 2014-01-13 DIAGNOSIS — I480 Paroxysmal atrial fibrillation: Secondary | ICD-10-CM | POA: Diagnosis not present

## 2014-01-13 LAB — POCT INR: INR: 1.3

## 2014-01-14 ENCOUNTER — Ambulatory Visit: Payer: Medicare Other | Admitting: Neurology

## 2014-01-20 ENCOUNTER — Ambulatory Visit (INDEPENDENT_AMBULATORY_CARE_PROVIDER_SITE_OTHER): Payer: Medicare Other | Admitting: *Deleted

## 2014-01-20 DIAGNOSIS — Z7901 Long term (current) use of anticoagulants: Secondary | ICD-10-CM | POA: Diagnosis not present

## 2014-01-20 DIAGNOSIS — I480 Paroxysmal atrial fibrillation: Secondary | ICD-10-CM

## 2014-01-20 DIAGNOSIS — I4891 Unspecified atrial fibrillation: Secondary | ICD-10-CM

## 2014-01-20 DIAGNOSIS — Z5181 Encounter for therapeutic drug level monitoring: Secondary | ICD-10-CM

## 2014-01-20 LAB — POCT INR: INR: 2.6

## 2014-02-03 ENCOUNTER — Ambulatory Visit (INDEPENDENT_AMBULATORY_CARE_PROVIDER_SITE_OTHER): Payer: Medicare Other | Admitting: *Deleted

## 2014-02-03 DIAGNOSIS — Z5181 Encounter for therapeutic drug level monitoring: Secondary | ICD-10-CM | POA: Diagnosis not present

## 2014-02-03 DIAGNOSIS — Z7901 Long term (current) use of anticoagulants: Secondary | ICD-10-CM

## 2014-02-03 DIAGNOSIS — I480 Paroxysmal atrial fibrillation: Secondary | ICD-10-CM

## 2014-02-03 DIAGNOSIS — I4891 Unspecified atrial fibrillation: Secondary | ICD-10-CM

## 2014-02-03 LAB — POCT INR: INR: 2.7

## 2014-02-11 DIAGNOSIS — J309 Allergic rhinitis, unspecified: Secondary | ICD-10-CM | POA: Diagnosis not present

## 2014-02-11 DIAGNOSIS — R109 Unspecified abdominal pain: Secondary | ICD-10-CM | POA: Diagnosis not present

## 2014-02-26 ENCOUNTER — Ambulatory Visit (INDEPENDENT_AMBULATORY_CARE_PROVIDER_SITE_OTHER): Payer: Medicare Other

## 2014-02-26 DIAGNOSIS — Z7901 Long term (current) use of anticoagulants: Secondary | ICD-10-CM | POA: Diagnosis not present

## 2014-02-26 DIAGNOSIS — Z5181 Encounter for therapeutic drug level monitoring: Secondary | ICD-10-CM | POA: Diagnosis not present

## 2014-02-26 DIAGNOSIS — I4891 Unspecified atrial fibrillation: Secondary | ICD-10-CM | POA: Diagnosis not present

## 2014-02-26 DIAGNOSIS — I480 Paroxysmal atrial fibrillation: Secondary | ICD-10-CM | POA: Diagnosis not present

## 2014-02-26 LAB — POCT INR: INR: 5.1

## 2014-03-04 ENCOUNTER — Ambulatory Visit (INDEPENDENT_AMBULATORY_CARE_PROVIDER_SITE_OTHER): Payer: Medicare Other | Admitting: *Deleted

## 2014-03-04 DIAGNOSIS — Z5181 Encounter for therapeutic drug level monitoring: Secondary | ICD-10-CM | POA: Diagnosis not present

## 2014-03-04 DIAGNOSIS — Z7901 Long term (current) use of anticoagulants: Secondary | ICD-10-CM

## 2014-03-04 DIAGNOSIS — I480 Paroxysmal atrial fibrillation: Secondary | ICD-10-CM

## 2014-03-04 DIAGNOSIS — I4891 Unspecified atrial fibrillation: Secondary | ICD-10-CM | POA: Diagnosis not present

## 2014-03-04 LAB — POCT INR: INR: 1.5

## 2014-03-12 ENCOUNTER — Ambulatory Visit (INDEPENDENT_AMBULATORY_CARE_PROVIDER_SITE_OTHER): Payer: Medicare Other | Admitting: Pharmacist Clinician (PhC)/ Clinical Pharmacy Specialist

## 2014-03-12 DIAGNOSIS — I480 Paroxysmal atrial fibrillation: Secondary | ICD-10-CM | POA: Diagnosis not present

## 2014-03-12 DIAGNOSIS — Z7901 Long term (current) use of anticoagulants: Secondary | ICD-10-CM

## 2014-03-12 DIAGNOSIS — Z5181 Encounter for therapeutic drug level monitoring: Secondary | ICD-10-CM

## 2014-03-12 DIAGNOSIS — I4891 Unspecified atrial fibrillation: Secondary | ICD-10-CM | POA: Diagnosis not present

## 2014-03-12 LAB — POCT INR: INR: 1.8

## 2014-03-16 ENCOUNTER — Encounter: Payer: Self-pay | Admitting: Neurology

## 2014-03-16 ENCOUNTER — Ambulatory Visit (INDEPENDENT_AMBULATORY_CARE_PROVIDER_SITE_OTHER): Payer: Medicare Other | Admitting: Neurology

## 2014-03-16 ENCOUNTER — Telehealth: Payer: Self-pay | Admitting: Neurology

## 2014-03-16 VITALS — BP 118/60 | HR 66 | Temp 97.8°F | Resp 16 | Ht 62.0 in | Wt 101.8 lb

## 2014-03-16 DIAGNOSIS — G3184 Mild cognitive impairment, so stated: Secondary | ICD-10-CM

## 2014-03-16 DIAGNOSIS — F419 Anxiety disorder, unspecified: Secondary | ICD-10-CM

## 2014-03-16 DIAGNOSIS — F32A Depression, unspecified: Secondary | ICD-10-CM

## 2014-03-16 DIAGNOSIS — F329 Major depressive disorder, single episode, unspecified: Secondary | ICD-10-CM | POA: Diagnosis not present

## 2014-03-16 MED ORDER — MEMANTINE HCL 10 MG PO TABS: ORAL_TABLET | ORAL | Status: DC

## 2014-03-16 NOTE — Telephone Encounter (Signed)
Pt made appt to come in sooner to see Dr Tomi Likens so we canceled the march appt

## 2014-03-16 NOTE — Progress Notes (Signed)
NEUROLOGY FOLLOW UP OFFICE NOTE  CLARENE Stone [id number]  HISTORY OF PRESENT ILLNESS: [name] is a 81 year old right-handed woman with history of paroxysmal atrial fibrillation (on anticoagulation), hypertension, CAD, polymyalgia rheumatic, depression, IBS, and arthritis who follows up for mild cognitive impairment.  Records and images reviewed.  UPDATE: Due to grogginess, she discontinued Namenda and continued Aricept.  She feels a little better.  She is concerned about worsening memory.  When she wakes up in the morning, she is not sure of the day or what her plans are for that day.  She is having more trouble remembering names of people at church.  She is not disoriented but cannot remember names of certain streets.  She is quite anxious and scared about her current predicament.  She has started relaxation techniques to help with the anxiety and depression.  HISTORY: She is a retired Education officer, museum.  She began noticing gradual memory deficits over the past 2 years.  At first, she would forget appointments.  She would need to use a calendar to remember to keep phone appointments with friends that she had for 6-8 years.  She tends to look at the calendar 3 or 4 times a day to make sure she doesn't miss an appointment.  She began misplacing objects around the house.  She began forgetting recipes that she used for many years.  She has lived in [place] for over 32 years.  Sometimes when somebody talks to her about a specific location, she has to stop and try to remember where it is.  On rare occasions, she has gotten disoriented while driving familiar routes.  More recently, she began forgetting names of acquaintances and it would take a while before she remembers.  She enjoys reading, but often she will have to re-read chapters because she forgot the content.  She belongs to a book club and has become less vocal because she cannot remember the story as much anymore.  When she is in the  kitchen, she sometimes forgets where she keeps the pots or pans.  She has also begun forgetting to take her medication, so her husband has to remind her.  Her husband manages the finances, but she does write checks and pay some bills and has not had any difficulty with that.  She was given a diagnosis of early Alzheimer's recently, and has been anxious and scared since that time.  She easily cries but she says it is due to this news and not because of depression.  She denies hallucinations or delusions.  She usually sleeps well.  Both her mother and sister had dementia.    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  TSH was 2.850 and methylmalonic acid level was 145.  MOCA from 11/21/13 was 21/30.  She has cerebrovascular disease with risk factors such as atrial fibrillation, hypertension, and hyperlipidemia.    Medications include:  Coumadin, Lisinopril, melatonin 5mg , Zocor 20mg , Prilosec, Atelvia 35mg , D3 1000 IU, B12 500mg , Vit E.  PAST MEDICAL HISTORY: Past Medical History  Diagnosis Date  . Spinal stenosis   . Anxiety   . GERD (gastroesophageal reflux disease)   . Osteoporosis   . Paroxysmal atrial fibrillation     Coumadin therapy  . Hypertension   . Hyperlipidemia   . CAD (coronary artery disease) 2008/2010    Catheterization 2008, 40% LAD  after first diagonal  /  nuclear January, 2010 normal  . Carotid artery disease     Doppler, July, 2011, 0-39% bilateral  . Endometrial polyp   . Atrophic vaginitis   . Vitamin D deficiency   . Stress   . Normal nuclear stress test Feb 2013    No ischemia. Normal wall motion. EF greater than 70%  . Osteopenia   . Palpitations     Monitor, May, 2013, no atrial fib,  one short run of PSVT  . Memory difficulty 01-20-13    "memory issues"  . Osteoarthritis   . Polymyalgia  rheumatica   . Irritable bowel syndrome with constipation   . Decreased platelet count 04/2011    142,000    MEDICATIONS: Current Outpatient Prescriptions on File Prior to Visit  Medication Sig Dispense Refill  . CALCIUM PO Take by mouth as directed.    . Calcium-Magnesium-Vitamin D (CALCIUM 500 PO) Take 1 tablet by mouth daily.    . Cyanocobalamin (VITAMIN B-12 PO) Take by mouth as directed.    . diltiazem (CARDIZEM CD) 120 MG 24 hr capsule Take 1 capsule (120 mg total) by mouth daily. 90 capsule 1  . donepezil (ARICEPT) 10 MG tablet Take 1 tablet (10 mg total) by mouth at bedtime. 30 tablet 3  . ipratropium (ATROVENT) 0.06 % nasal spray Place into both nostrils every other day.     . lisinopril (PRINIVIL,ZESTRIL) 5 MG tablet 1 tablet daily.    [name] MELATONIN PO Take by mouth at bedtime as needed and may repeat dose one time if needed (FOR SLEEP).     . metoprolol tartrate (LOPRESSOR) 25 MG tablet Take 25 mg by mouth 2 (two) times daily.    . Multiple Vitamins-Minerals (OCUVITE PRESERVISION) TABS Take 1 each by mouth daily.      [name] warfarin (COUMADIN) 5 MG tablet Take as directed by anticoagulation clinic 90 tablet 1  . [DISCONTINUED] estradiol (ESTRACE) 1 MG tablet Take 1 mg by mouth as needed.      No current facility-administered medications on file prior to visit.    ALLERGIES: No Known Allergies  FAMILY HISTORY: Family History  Problem Relation Age of Onset  . Transient ischemic attack Mother   . Stroke Mother   . Cancer Father     PROSTATE  . Pancreatitis Father   . Stroke Sister   . Spina bifida Brother   . Hypertension Brother   . Heart disease Brother   . Hypertension Sister   . Diabetes Sister   . Heart disease Sister   . Dementia Sister     SOCIAL HISTORY: History   Social History  . Marital Status: Married    Spouse Name: [name]    Number of Children: 2  . Years of Education: Masters   Occupational History  . Retired    Social History Main Topics  .  Smoking status: Never Smoker   . Smokeless tobacco: Never Used  . Alcohol Use: No  . Drug Use: No  . Sexual Activity: No   Other Topics Concern  . Not on file   Social History Narrative   Patient is married [name]) and lives at home with her husband.   Patient has two children.   Retired; Married;   Patient is right-handed.   Patient drinks very little caffeine.   Patient has a Scientist, water quality.   Has had increased stress as her husband had a stroke ~ 3wks ago and she has been taking care  of him. (04/07/11)    REVIEW OF SYSTEMS: Constitutional: No fevers, chills, or sweats, no generalized fatigue, change in appetite Eyes: No visual changes, double vision, eye pain Ear, nose and throat: No hearing loss, ear pain, nasal congestion, sore throat Cardiovascular: No chest pain, palpitations Respiratory:  No shortness of breath at rest or with exertion, wheezes GastrointestinaI: No nausea, vomiting, diarrhea, abdominal pain, fecal incontinence Genitourinary:  No dysuria, urinary retention or frequency Musculoskeletal:  No neck pain, back pain Integumentary: No rash, pruritus, skin lesions Neurological: as above Psychiatric: No depression, insomnia, anxiety Endocrine: No palpitations, fatigue, diaphoresis, mood swings, change in appetite, change in weight, increased thirst Hematologic/Lymphatic:  No anemia, purpura, petechiae. Allergic/Immunologic: no itchy/runny eyes, nasal congestion, recent allergic reactions, rashes  PHYSICAL EXAM: Filed Vitals:   03/16/14 0900  BP: 118/60  Pulse: 66  Temp: 97.8 F (36.6 C)  Resp: 16   General: No acute distress Head:  Normocephalic/atraumatic Eyes:  Fundoscopic exam unremarkable without vessel changes, exudates, hemorrhages or papilledema. Neck: supple, no paraspinal tenderness, full range of motion Heart:  Regular rate and rhythm Lungs:  Clear to auscultation bilaterally Back: No paraspinal tenderness Neurological Exam: alert and  oriented to person, place, and time. Attention span and concentration intact, recent and remote memory intact, fund of knowledge intact.  Speech fluent and not dysarthric, language intact.  CN II-XII intact. Fundoscopic exam unremarkable without vessel changes, exudates, hemorrhages or papilledema.  Bulk and tone normal, muscle strength 5/5 throughout.  Sensation to light touch  intact.  Deep tendon reflexes 2+ throughout.  Finger to nose testing intact.  Gait normal.  IMPRESSION: Alzheimer's disease Cerebrovascular disease Anxiety and depression.  I suspect this is the primary reason for perceived worsening memory.  PLAN: 1.  We will restart Namenda and titrate up to 10mg  twice daily.  Hopefully, she will tolerate it better. 2.  Continue Aricept 10mg  daily. 3.  May consider starting a medication such as Lexapro at next visit. 4.  Follow up in 3 months.   Metta Clines, DO  CC: Lajean Manes, MD

## 2014-03-16 NOTE — Patient Instructions (Addendum)
1.  Continue Aricept 10mg  daily 2.  Start Namenda (memantine) 10mg  tablets.  Take 1/2 tablet at bedtime for 7 days, then 1/2 tablet twice daily for 7 days, then 1/2 tablet in morning and 1 tablet at bedtime for 7 days, then 1 tablet twice daily.   Side effects include dizziness, headache, diarrhea or constipation.  Call with any questions or concerns. 3.  Follow up in 3 months with [facility]

## 2014-03-26 DIAGNOSIS — M7071 Other bursitis of hip, right hip: Secondary | ICD-10-CM | POA: Diagnosis not present

## 2014-03-27 ENCOUNTER — Ambulatory Visit (INDEPENDENT_AMBULATORY_CARE_PROVIDER_SITE_OTHER): Payer: Medicare Other

## 2014-03-27 DIAGNOSIS — I480 Paroxysmal atrial fibrillation: Secondary | ICD-10-CM

## 2014-03-27 DIAGNOSIS — I4891 Unspecified atrial fibrillation: Secondary | ICD-10-CM | POA: Diagnosis not present

## 2014-03-27 DIAGNOSIS — Z5181 Encounter for therapeutic drug level monitoring: Secondary | ICD-10-CM | POA: Diagnosis not present

## 2014-03-27 DIAGNOSIS — Z7901 Long term (current) use of anticoagulants: Secondary | ICD-10-CM | POA: Diagnosis not present

## 2014-03-27 LAB — POCT INR: INR: 2.9

## 2014-03-30 DIAGNOSIS — Z79899 Other long term (current) drug therapy: Secondary | ICD-10-CM | POA: Diagnosis not present

## 2014-03-30 DIAGNOSIS — R252 Cramp and spasm: Secondary | ICD-10-CM | POA: Diagnosis not present

## 2014-03-30 DIAGNOSIS — I10 Essential (primary) hypertension: Secondary | ICD-10-CM | POA: Diagnosis not present

## 2014-03-30 DIAGNOSIS — J309 Allergic rhinitis, unspecified: Secondary | ICD-10-CM | POA: Diagnosis not present

## 2014-03-30 DIAGNOSIS — I482 Chronic atrial fibrillation: Secondary | ICD-10-CM | POA: Diagnosis not present

## 2014-04-02 DIAGNOSIS — M7071 Other bursitis of hip, right hip: Secondary | ICD-10-CM | POA: Diagnosis not present

## 2014-04-08 DIAGNOSIS — M7071 Other bursitis of hip, right hip: Secondary | ICD-10-CM | POA: Diagnosis not present

## 2014-04-10 DIAGNOSIS — M7071 Other bursitis of hip, right hip: Secondary | ICD-10-CM | POA: Diagnosis not present

## 2014-04-14 DIAGNOSIS — M7071 Other bursitis of hip, right hip: Secondary | ICD-10-CM | POA: Diagnosis not present

## 2014-04-17 ENCOUNTER — Ambulatory Visit (INDEPENDENT_AMBULATORY_CARE_PROVIDER_SITE_OTHER): Payer: Medicare Other | Admitting: *Deleted

## 2014-04-17 DIAGNOSIS — Z5181 Encounter for therapeutic drug level monitoring: Secondary | ICD-10-CM

## 2014-04-17 DIAGNOSIS — I4891 Unspecified atrial fibrillation: Secondary | ICD-10-CM | POA: Diagnosis not present

## 2014-04-17 DIAGNOSIS — I480 Paroxysmal atrial fibrillation: Secondary | ICD-10-CM

## 2014-04-17 DIAGNOSIS — Z7901 Long term (current) use of anticoagulants: Secondary | ICD-10-CM

## 2014-04-17 DIAGNOSIS — M7071 Other bursitis of hip, right hip: Secondary | ICD-10-CM | POA: Diagnosis not present

## 2014-04-17 LAB — POCT INR: INR: 1.3

## 2014-04-21 DIAGNOSIS — M7071 Other bursitis of hip, right hip: Secondary | ICD-10-CM | POA: Diagnosis not present

## 2014-04-24 ENCOUNTER — Ambulatory Visit (INDEPENDENT_AMBULATORY_CARE_PROVIDER_SITE_OTHER): Payer: Medicare Other | Admitting: *Deleted

## 2014-04-24 DIAGNOSIS — I480 Paroxysmal atrial fibrillation: Secondary | ICD-10-CM | POA: Diagnosis not present

## 2014-04-24 DIAGNOSIS — I4891 Unspecified atrial fibrillation: Secondary | ICD-10-CM | POA: Diagnosis not present

## 2014-04-24 DIAGNOSIS — Z5181 Encounter for therapeutic drug level monitoring: Secondary | ICD-10-CM

## 2014-04-24 DIAGNOSIS — Z7901 Long term (current) use of anticoagulants: Secondary | ICD-10-CM

## 2014-04-24 DIAGNOSIS — M7071 Other bursitis of hip, right hip: Secondary | ICD-10-CM | POA: Diagnosis not present

## 2014-04-24 LAB — POCT INR: INR: 2

## 2014-04-28 ENCOUNTER — Other Ambulatory Visit: Payer: Self-pay | Admitting: Neurology

## 2014-04-28 DIAGNOSIS — M7071 Other bursitis of hip, right hip: Secondary | ICD-10-CM | POA: Diagnosis not present

## 2014-05-01 DIAGNOSIS — M7071 Other bursitis of hip, right hip: Secondary | ICD-10-CM | POA: Diagnosis not present

## 2014-05-05 DIAGNOSIS — M7071 Other bursitis of hip, right hip: Secondary | ICD-10-CM | POA: Diagnosis not present

## 2014-05-08 ENCOUNTER — Ambulatory Visit (INDEPENDENT_AMBULATORY_CARE_PROVIDER_SITE_OTHER): Payer: Medicare Other | Admitting: *Deleted

## 2014-05-08 ENCOUNTER — Other Ambulatory Visit: Payer: Self-pay | Admitting: *Deleted

## 2014-05-08 DIAGNOSIS — I4891 Unspecified atrial fibrillation: Secondary | ICD-10-CM

## 2014-05-08 DIAGNOSIS — Z7901 Long term (current) use of anticoagulants: Secondary | ICD-10-CM

## 2014-05-08 DIAGNOSIS — G309 Alzheimer's disease, unspecified: Principal | ICD-10-CM

## 2014-05-08 DIAGNOSIS — I480 Paroxysmal atrial fibrillation: Secondary | ICD-10-CM

## 2014-05-08 DIAGNOSIS — Z5181 Encounter for therapeutic drug level monitoring: Secondary | ICD-10-CM

## 2014-05-08 DIAGNOSIS — F0280 Dementia in other diseases classified elsewhere without behavioral disturbance: Secondary | ICD-10-CM

## 2014-05-08 DIAGNOSIS — M7071 Other bursitis of hip, right hip: Secondary | ICD-10-CM | POA: Diagnosis not present

## 2014-05-08 LAB — POCT INR: INR: 1.5

## 2014-05-08 MED ORDER — MEMANTINE HCL 10 MG PO TABS: ORAL_TABLET | ORAL | Status: DC

## 2014-05-12 DIAGNOSIS — M7071 Other bursitis of hip, right hip: Secondary | ICD-10-CM | POA: Diagnosis not present

## 2014-05-13 DIAGNOSIS — J029 Acute pharyngitis, unspecified: Secondary | ICD-10-CM | POA: Diagnosis not present

## 2014-05-13 DIAGNOSIS — J329 Chronic sinusitis, unspecified: Secondary | ICD-10-CM | POA: Diagnosis not present

## 2014-05-18 ENCOUNTER — Ambulatory Visit (INDEPENDENT_AMBULATORY_CARE_PROVIDER_SITE_OTHER): Payer: Medicare Other

## 2014-05-18 DIAGNOSIS — I480 Paroxysmal atrial fibrillation: Secondary | ICD-10-CM

## 2014-05-18 DIAGNOSIS — Z7901 Long term (current) use of anticoagulants: Secondary | ICD-10-CM

## 2014-05-18 DIAGNOSIS — Z5181 Encounter for therapeutic drug level monitoring: Secondary | ICD-10-CM | POA: Diagnosis not present

## 2014-05-18 DIAGNOSIS — I4891 Unspecified atrial fibrillation: Secondary | ICD-10-CM | POA: Diagnosis not present

## 2014-05-18 LAB — POCT INR: INR: 1.5

## 2014-05-21 DIAGNOSIS — M7071 Other bursitis of hip, right hip: Secondary | ICD-10-CM | POA: Diagnosis not present

## 2014-05-22 ENCOUNTER — Ambulatory Visit: Payer: Medicare Other | Admitting: Neurology

## 2014-05-27 DIAGNOSIS — J0101 Acute recurrent maxillary sinusitis: Secondary | ICD-10-CM | POA: Diagnosis not present

## 2014-05-28 DIAGNOSIS — M7071 Other bursitis of hip, right hip: Secondary | ICD-10-CM | POA: Diagnosis not present

## 2014-06-02 ENCOUNTER — Ambulatory Visit (INDEPENDENT_AMBULATORY_CARE_PROVIDER_SITE_OTHER): Payer: Medicare Other | Admitting: *Deleted

## 2014-06-02 DIAGNOSIS — Z5181 Encounter for therapeutic drug level monitoring: Secondary | ICD-10-CM | POA: Diagnosis not present

## 2014-06-02 DIAGNOSIS — Z7901 Long term (current) use of anticoagulants: Secondary | ICD-10-CM | POA: Diagnosis not present

## 2014-06-02 DIAGNOSIS — I4891 Unspecified atrial fibrillation: Secondary | ICD-10-CM | POA: Diagnosis not present

## 2014-06-02 DIAGNOSIS — I480 Paroxysmal atrial fibrillation: Secondary | ICD-10-CM | POA: Diagnosis not present

## 2014-06-02 LAB — POCT INR: INR: 2

## 2014-06-11 DIAGNOSIS — R490 Dysphonia: Secondary | ICD-10-CM | POA: Diagnosis not present

## 2014-06-11 DIAGNOSIS — J029 Acute pharyngitis, unspecified: Secondary | ICD-10-CM | POA: Diagnosis not present

## 2014-06-16 ENCOUNTER — Ambulatory Visit (INDEPENDENT_AMBULATORY_CARE_PROVIDER_SITE_OTHER): Payer: Medicare Other | Admitting: Neurology

## 2014-06-16 ENCOUNTER — Encounter: Payer: Self-pay | Admitting: Neurology

## 2014-06-16 VITALS — BP 138/70 | HR 68 | Temp 97.5°F | Resp 18 | Ht 62.0 in | Wt 100.5 lb

## 2014-06-16 DIAGNOSIS — F419 Anxiety disorder, unspecified: Secondary | ICD-10-CM | POA: Diagnosis not present

## 2014-06-16 DIAGNOSIS — G309 Alzheimer's disease, unspecified: Secondary | ICD-10-CM

## 2014-06-16 DIAGNOSIS — I480 Paroxysmal atrial fibrillation: Secondary | ICD-10-CM

## 2014-06-16 DIAGNOSIS — F0280 Dementia in other diseases classified elsewhere without behavioral disturbance: Secondary | ICD-10-CM

## 2014-06-16 DIAGNOSIS — R42 Dizziness and giddiness: Secondary | ICD-10-CM | POA: Diagnosis not present

## 2014-06-16 DIAGNOSIS — G252 Other specified forms of tremor: Secondary | ICD-10-CM | POA: Diagnosis not present

## 2014-06-16 NOTE — Patient Instructions (Signed)
I think you are doing well from a cognitive standpoint.  Objectively, things seem stable.  I think your anxiety is playing a large role in your concerns.  I would monitor the tremor for now.  I would ask Dr. Felipa Eth whether there is any other reason for the dizziness.  Continue the aricept and namenda.  Follow up in 6 months.

## 2014-06-16 NOTE — Progress Notes (Signed)
NEUROLOGY FOLLOW UP OFFICE NOTE  Donna LIEVANOS [id number]  HISTORY OF PRESENT ILLNESS: [name] is a 81 year old right-handed woman with history of paroxysmal atrial fibrillation (on anticoagulation), hypertension, CAD, polymyalgia rheumatic, depression, IBS, and arthritis who follows up for Alzheimer's disease.  She is accompanied by her husband who provides some history.  UPDATE: She is taking Aricept 10mg  daily and Namenda 10mg  twice daily.  She has some concerns.  She feels that she is forgetting more recently.  She feels that she is forgetting how to spell certain words.  Her husband says she seems to react out of proportion than other people would react.  She also notes shakiness in her hands.  When she writes or holds a utensil, her hand shakes.  She also feels lightheaded and unsteady on her feet more over the past month.  It is not positional and it comes and goes.  She has not had any recent changes in medication although she was treated for strept throat this past month.  HISTORY: She is a retired Education officer, museum.  She began noticing gradual memory deficits over the past 2.5 years.  At first, she would forget appointments.  She would need to use a calendar to remember to keep phone appointments with friends that she had for 6-8 years.  She tends to look at the calendar 3 or 4 times a day to make sure she doesn't miss an appointment.  She began misplacing objects around the house.  She began forgetting recipes that she used for many years.  She has lived in [place] for over 40 years.  Sometimes when somebody talks to her about a specific location, she has to stop and try to remember where it is.  On rare occasions, she has gotten disoriented while driving familiar routes.  She cannot remember names of certain streets.  She forgets names of acquaintances and it would take a while before she remembers.  She enjoys reading, but often she will have to re-read chapters because she  forgot the content.  She belongs to a book club and has become less vocal because she cannot remember the story as much anymore.  When she is in the kitchen, she sometimes forgets where she keeps the pots or pans.  She has also begun forgetting to take her medication, so her husband has to remind her.  Her husband manages the finances, but she does write checks and pay some bills and has not had any difficulty with that.  When she wakes up in the morning, she is not sure of the day or what her plans are for that day.  She was given a diagnosis of early Alzheimer's recently, and has been anxious and scared since that time.  She easily cries but she says it is due to this news and not because of depression.  She denies hallucinations or delusions.  She usually sleeps well.  Both her mother and sister had dementia.    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  TSH was 2.850 and methylmalonic acid level was 145.  She has cerebrovascular disease with risk factors such as atrial fibrillation, hypertension, and hyperlipidemia.    PAST MEDICAL HISTORY: Past Medical History  Diagnosis Date  . Spinal stenosis   . Anxiety   . GERD (gastroesophageal reflux disease)   . Osteoporosis   .  Paroxysmal atrial fibrillation     Coumadin therapy  . Hypertension   . Hyperlipidemia   . CAD (coronary artery disease) 2008/2010    Catheterization 2008, 40% LAD after first diagonal  /  nuclear January, 2010 normal  . Carotid artery disease     Doppler, July, 2011, 0-39% bilateral  . Endometrial polyp   . Atrophic vaginitis   . Vitamin D deficiency   . Stress   . Normal nuclear stress test Feb 2013    No ischemia. Normal wall motion. EF greater than 70%  . Osteopenia   . Palpitations     Monitor, May, 2013, no atrial fib,  one short run of PSVT    . Memory difficulty 01-20-13    "memory issues"  . Osteoarthritis   . Polymyalgia rheumatica   . Irritable bowel syndrome with constipation   . Decreased platelet count 04/2011    142,000    MEDICATIONS: Current Outpatient Prescriptions on File Prior to Visit  Medication Sig Dispense Refill  . CALCIUM PO Take by mouth as directed.    . Calcium-Magnesium-Vitamin D (CALCIUM 500 PO) Take 1 tablet by mouth daily.    . Cyanocobalamin (VITAMIN B-12 PO) Take by mouth as directed.    . diltiazem (CARDIZEM CD) 120 MG 24 hr capsule Take 1 capsule (120 mg total) by mouth daily. 90 capsule 1  . donepezil (ARICEPT) 10 MG tablet TAKE 1 TABLET BY MOUTH EVERY NIGHT AT BEDTIME 90 tablet 0  . FLUZONE HIGH-DOSE 0.5 ML SUSY   0  . ipratropium (ATROVENT) 0.06 % nasal spray Place into both nostrils every other day.     . lisinopril (PRINIVIL,ZESTRIL) 5 MG tablet 1 tablet daily.    [name] MELATONIN PO Take by mouth at bedtime as needed and may repeat dose one time if needed (FOR SLEEP).     . memantine (NAMENDA) 10 MG tablet Take 0.5 tab qhs x7d, then 0.5tab BID x7d, then 0.5tab qAM and 1 tab qhs x7d, then 1tab BID. 60 tablet 0  . memantine (NAMENDA) 10 MG tablet 2 tablets at bedtime 60 tablet 2  . metoprolol tartrate (LOPRESSOR) 25 MG tablet Take 25 mg by mouth 2 (two) times daily.    . Multiple Vitamins-Minerals (OCUVITE PRESERVISION) TABS Take 1 each by mouth daily.      [name] warfarin (COUMADIN) 5 MG tablet Take as directed by anticoagulation clinic 90 tablet 1  . [DISCONTINUED] estradiol (ESTRACE) 1 MG tablet Take 1 mg by mouth as needed.      No current facility-administered medications on file prior to visit.    ALLERGIES: No Known Allergies  FAMILY HISTORY: Family History  Problem Relation Age of Onset  . Transient ischemic attack Mother   . Stroke Mother   . Cancer Father     PROSTATE  . Pancreatitis Father   . Stroke Sister   . Spina bifida Brother   . Hypertension Brother   . Heart disease  Brother   . Hypertension Sister   . Diabetes Sister   . Heart disease Sister   . Dementia Sister     SOCIAL HISTORY: History   Social History  . Marital Status: Married    Spouse Name: Jeneen Rinks  . Number of Children: 2  . Years of Education: Masters   Occupational History  . Retired    Social History Main Topics  . Smoking status: Never Smoker   . Smokeless tobacco: Never Used  . Alcohol Use: No  . Drug Use:  No  . Sexual Activity: No   Other Topics Concern  . Not on file   Social History Narrative   Patient is married Jeneen Rinks) and lives at home with her husband.   Patient has two children.   Retired; Married;   Patient is right-handed.   Patient drinks very little caffeine.   Patient has a Scientist, water quality.   Has had increased stress as her husband had a stroke ~ 3wks ago and she has been taking care of him. (04/07/11)    REVIEW OF SYSTEMS: Constitutional: No fevers, chills, or sweats, no generalized fatigue, change in appetite Eyes: No visual changes, double vision, eye pain Ear, nose and throat: No hearing loss, ear pain, nasal congestion, sore throat Cardiovascular: No chest pain, palpitations Respiratory:  No shortness of breath at rest or with exertion, wheezes GastrointestinaI: No nausea, vomiting, diarrhea, abdominal pain, fecal incontinence Genitourinary:  No dysuria, urinary retention or frequency Musculoskeletal:  No neck pain, back pain Integumentary: No rash, pruritus, skin lesions Neurological: as above Psychiatric: depression anxiety Endocrine: No palpitations, fatigue, diaphoresis, mood swings, change in appetite, change in weight, increased thirst Hematologic/Lymphatic:  No anemia, purpura, petechiae. Allergic/Immunologic: no itchy/runny eyes, nasal congestion, recent allergic reactions, rashes  PHYSICAL EXAM: Filed Vitals:   06/16/14 0755  BP: 138/70  Pulse: 68  Temp: 97.5 F (36.4 C)  Resp: 18   General: No acute distress Head:   Normocephalic/atraumatic Eyes:  Fundoscopic exam unremarkable without vessel changes, exudates, hemorrhages or papilledema. Neck: supple, no paraspinal tenderness, full range of motion Heart:  Regular rate and rhythm Lungs:  Clear to auscultation bilaterally Back: No paraspinal tenderness Neurological Exam: alert and oriented to person, place, and time. Attention span and concentration intact, recent and remote memory intact, fund of knowledge intact.  Speech fluent and not dysarthric, language intact.   Montreal Cognitive Assessment  06/16/2014 06/16/2014 11/21/2013  Visuospatial/ Executive (0/5) 4 4 3   Naming (0/3) 2 2 3   Attention: Read list of digits (0/2) 2 2 2   Attention: Read list of letters (0/1) 1 1 1   Attention: Serial 7 subtraction starting at 100 (0/3) 3 3 3   Language: Repeat phrase (0/2) 2 2 2   Language : Fluency (0/1) 1 1 1   Abstraction (0/2) 2 2 1   Delayed Recall (0/5) 0 0 0  Orientation (0/6) 6 - 5  Total 23 - 21  Adjusted Score (based on education) 23 - 21   CN II-XII intact. Fundoscopic exam unremarkable without vessel changes, exudates, hemorrhages or papilledema.  Bulk and tone normal, muscle strength 5/5 throughout.  Sensation to light touch, temperature and vibration intact.  Deep tendon reflexes 2+ throughout, toes downgoing.  Finger to nose and heel to shin testing intact.  Gait normal, Romberg negative.  IMPRESSION: 1.  Alzheimer's disease.  Objectively, she is doing well.  I think anxiety is playing a strong role in her symptoms. 2.  Postural tremor, mild so I would monitor for now. 3.  Dizziness and lightheadedness.  She questions if it is related to her medication.  Since this just started a month ago, I don't suspect it.  We can discontinue one of her medications, however treatment for Alzheimer's is limited and this is all we have to offer.  At this point, she would like to remain on it. 4.  Anxiety and depression.  5.  Paroxysmal atrial fibrillation-on  anticoagulation  PLAN: 1.  Continue Aricept and Namenda 2.  She would like to forego starting an antidepressant 3.  Advised to ask Dr. Felipa Eth if there is any alternative reason for dizziness 4.  Follow up in 6 months.  30 minutes spent with patient, over 50% spent discussing how she is doing and management.  Metta Clines, DO  CC:  Lajean Manes, MD

## 2014-06-16 NOTE — Progress Notes (Signed)
NEUROLOGY FOLLOW UP OFFICE NOTE  [name] [id number]  HISTORY OF PRESENT ILLNESS: [name] is a 81 year old right-handed woman with history of paroxysmal atrial fibrillation (on anticoagulation), hypertension, CAD, polymyalgia rheumatic, depression, IBS, and arthritis who follows up for Alzheimer's disease.  She is accompanied by her husband who provides some history.  UPDATE: She is taking Aricept 10mg  daily and Namenda 10mg  twice daily.  She has some concerns.  She feels that she is forgetting more recently.  She feels that she is forgetting how to spell certain words.  Her husband says she seems to react out of proportion than other people would react.  She also notes shakiness in her hands.  When she writes or holds a utensil, her hand shakes.  She also feels lightheaded and unsteady on her feet more over the past month.  It is not positional and it comes and goes.  She has not had any recent changes in medication although she was treated for strept throat this past month.  HISTORY: She is a retired Education officer, museum.  She began noticing gradual memory deficits over the past 2.5 years.  At first, she would forget appointments.  She would need to use a calendar to remember to keep phone appointments with friends that she had for 6-8 years.  She tends to look at the calendar 3 or 4 times a day to make sure she doesn't miss an appointment.  She began misplacing objects around the house.  She began forgetting recipes that she used for many years.  She has lived in [place] for over 5 years.  Sometimes when somebody talks to her about a specific location, she has to stop and try to remember where it is.  On rare occasions, she has gotten disoriented while driving familiar routes.  She cannot remember names of certain streets.  She forgets names of acquaintances and it would take a while before she remembers.  She enjoys reading, but often she will have to re-read chapters because she forgot  the content.  She belongs to a book club and has become less vocal because she cannot remember the story as much anymore.  When she is in the kitchen, she sometimes forgets where she keeps the pots or pans.  She has also begun forgetting to take her medication, so her husband has to remind her.  Her husband manages the finances, but she does write checks and pay some bills and has not had any difficulty with that.  When she wakes up in the morning, she is not sure of the day or what her plans are for that day.  She was given a diagnosis of early Alzheimer's recently, and has been anxious and scared since that time.  She easily cries but she says it is due to this news and not because of depression.  She denies hallucinations or delusions.  She usually sleeps well.  Both her mother and sister had dementia.    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  TSH was 2.850 and methylmalonic acid level was 145.  She has cerebrovascular disease with risk factors such as atrial fibrillation, hypertension, and hyperlipidemia.    PAST MEDICAL HISTORY: Past Medical History  Diagnosis Date  . Spinal stenosis   . Anxiety   . GERD (gastroesophageal reflux disease)   . Osteoporosis   . Paroxysmal  atrial fibrillation     Coumadin therapy  . Hypertension   . Hyperlipidemia   . CAD (coronary artery disease) 2008/2010    Catheterization 2008, 40% LAD after first diagonal  /  nuclear January, 2010 normal  . Carotid artery disease     Doppler, July, 2011, 0-39% bilateral  . Endometrial polyp   . Atrophic vaginitis   . Vitamin D deficiency   . Stress   . Normal nuclear stress test Feb 2013    No ischemia. Normal wall motion. EF greater than 70%  . Osteopenia   . Palpitations     Monitor, May, 2013, no atrial fib,  one short run of PSVT  .  Memory difficulty 01-20-13    "memory issues"  . Osteoarthritis   . Polymyalgia rheumatica   . Irritable bowel syndrome with constipation   . Decreased platelet count 04/2011    142,000    MEDICATIONS: Current Outpatient Prescriptions on File Prior to Visit  Medication Sig Dispense Refill  . CALCIUM PO Take by mouth as directed.    . Calcium-Magnesium-Vitamin D (CALCIUM 500 PO) Take 1 tablet by mouth daily.    . Cyanocobalamin (VITAMIN B-12 PO) Take by mouth as directed.    . diltiazem (CARDIZEM CD) 120 MG 24 hr capsule Take 1 capsule (120 mg total) by mouth daily. 90 capsule 1  . donepezil (ARICEPT) 10 MG tablet TAKE 1 TABLET BY MOUTH EVERY NIGHT AT BEDTIME 90 tablet 0  . FLUZONE HIGH-DOSE 0.5 ML SUSY   0  . ipratropium (ATROVENT) 0.06 % nasal spray Place into both nostrils every other day.     . lisinopril (PRINIVIL,ZESTRIL) 5 MG tablet 1 tablet daily.    [name] MELATONIN PO Take by mouth at bedtime as needed and may repeat dose one time if needed (FOR SLEEP).     . memantine (NAMENDA) 10 MG tablet Take 0.5 tab qhs x7d, then 0.5tab BID x7d, then 0.5tab qAM and 1 tab qhs x7d, then 1tab BID. 60 tablet 0  . memantine (NAMENDA) 10 MG tablet 2 tablets at bedtime 60 tablet 2  . metoprolol tartrate (LOPRESSOR) 25 MG tablet Take 25 mg by mouth 2 (two) times daily.    . Multiple Vitamins-Minerals (OCUVITE PRESERVISION) TABS Take 1 each by mouth daily.      [name] warfarin (COUMADIN) 5 MG tablet Take as directed by anticoagulation clinic 90 tablet 1  . [DISCONTINUED] estradiol (ESTRACE) 1 MG tablet Take 1 mg by mouth as needed.      No current facility-administered medications on file prior to visit.    ALLERGIES: No Known Allergies  FAMILY HISTORY: Family History  Problem Relation Age of Onset  . Transient ischemic attack Mother   . Stroke Mother   . Cancer Father     PROSTATE  . Pancreatitis Father   . Stroke Sister   . Spina bifida Brother   . Hypertension Brother   . Heart disease  Brother   . Hypertension Sister   . Diabetes Sister   . Heart disease Sister   . Dementia Sister     SOCIAL HISTORY: History   Social History  . Marital Status: Married    Spouse Name: Jeneen Rinks  . Number of Children: 2  . Years of Education: Masters   Occupational History  . Retired    Social History Main Topics  . Smoking status: Never Smoker   . Smokeless tobacco: Never Used  . Alcohol Use: No  . Drug Use: No  .  Sexual Activity: No   Other Topics Concern  . Not on file   Social History Narrative   Patient is married Jeneen Rinks) and lives at home with her husband.   Patient has two children.   Retired; Married;   Patient is right-handed.   Patient drinks very little caffeine.   Patient has a Scientist, water quality.   Has had increased stress as her husband had a stroke ~ 3wks ago and she has been taking care of him. (04/07/11)    REVIEW OF SYSTEMS: Constitutional: No fevers, chills, or sweats, no generalized fatigue, change in appetite Eyes: No visual changes, double vision, eye pain Ear, nose and throat: No hearing loss, ear pain, nasal congestion, sore throat Cardiovascular: No chest pain, palpitations Respiratory:  No shortness of breath at rest or with exertion, wheezes GastrointestinaI: No nausea, vomiting, diarrhea, abdominal pain, fecal incontinence Genitourinary:  No dysuria, urinary retention or frequency Musculoskeletal:  No neck pain, back pain Integumentary: No rash, pruritus, skin lesions Neurological: as above Psychiatric: depression anxiety Endocrine: No palpitations, fatigue, diaphoresis, mood swings, change in appetite, change in weight, increased thirst Hematologic/Lymphatic:  No anemia, purpura, petechiae. Allergic/Immunologic: no itchy/runny eyes, nasal congestion, recent allergic reactions, rashes  PHYSICAL EXAM: Filed Vitals:   06/16/14 0755  BP: 138/70  Pulse: 68  Temp: 97.5 F (36.4 C)  Resp: 18   General: No acute distress Head:   Normocephalic/atraumatic Eyes:  Fundoscopic exam unremarkable without vessel changes, exudates, hemorrhages or papilledema. Neck: supple, no paraspinal tenderness, full range of motion Heart:  Regular rate and rhythm Lungs:  Clear to auscultation bilaterally Back: No paraspinal tenderness Neurological Exam: alert and oriented to person, place, and time. Attention span and concentration intact, recent and remote memory intact, fund of knowledge intact.  Speech fluent and not dysarthric, language intact.   Montreal Cognitive Assessment  06/16/2014 06/16/2014 11/21/2013  Visuospatial/ Executive (0/5) 4 4 3   Naming (0/3) 2 2 3   Attention: Read list of digits (0/2) 2 2 2   Attention: Read list of letters (0/1) 1 1 1   Attention: Serial 7 subtraction starting at 100 (0/3) 3 3 3   Language: Repeat phrase (0/2) 2 2 2   Language : Fluency (0/1) 1 1 1   Abstraction (0/2) 2 2 1   Delayed Recall (0/5) 0 0 0  Orientation (0/6) 6 - 5  Total 23 - 21  Adjusted Score (based on education) 23 - 21   CN II-XII intact. Fundoscopic exam unremarkable without vessel changes, exudates, hemorrhages or papilledema.  Bulk and tone normal, muscle strength 5/5 throughout.  Sensation to light touch, temperature and vibration intact.  Deep tendon reflexes 2+ throughout, toes downgoing.  Finger to nose and heel to shin testing intact.  Gait normal, Romberg negative.  IMPRESSION: 1.  Alzheimer's disease.  Objectively, she is doing well.  I think anxiety is playing a strong role in her symptoms. 2.  Postural tremor, mild so I would monitor for now. 3.  Dizziness and lightheadedness.  She questions if it is related to her medication.  Since this just started a month ago, I don't suspect it.  We can discontinue one of her medications, however treatment for Alzheimer's is limited and this is all we have to offer.  At this point, she would like to remain on it. 4.  Anxiety and depression.  5.  Paroxysmal atrial fibrillation-on  anticoagulation  PLAN: 1.  Continue Aricept and Namenda 2.  She would like to forego starting an antidepressant 3.  Advised to ask  Dr. Felipa Eth if there is any alternative reason for dizziness 4.  Follow up in 6 months.  30 minutes spent with patient, over 50% spent discussing how she is doing and management.  Metta Clines, DO  CC:  Lajean Manes, MD

## 2014-06-19 ENCOUNTER — Ambulatory Visit (INDEPENDENT_AMBULATORY_CARE_PROVIDER_SITE_OTHER): Payer: Medicare Other | Admitting: *Deleted

## 2014-06-19 ENCOUNTER — Ambulatory Visit (INDEPENDENT_AMBULATORY_CARE_PROVIDER_SITE_OTHER): Payer: Medicare Other | Admitting: Cardiology

## 2014-06-19 ENCOUNTER — Encounter: Payer: Self-pay | Admitting: Cardiology

## 2014-06-19 VITALS — BP 148/58 | HR 56 | Ht 62.0 in | Wt 101.8 lb

## 2014-06-19 DIAGNOSIS — I2510 Atherosclerotic heart disease of native coronary artery without angina pectoris: Secondary | ICD-10-CM | POA: Diagnosis not present

## 2014-06-19 DIAGNOSIS — R002 Palpitations: Secondary | ICD-10-CM

## 2014-06-19 DIAGNOSIS — Z7901 Long term (current) use of anticoagulants: Secondary | ICD-10-CM

## 2014-06-19 DIAGNOSIS — I4891 Unspecified atrial fibrillation: Secondary | ICD-10-CM | POA: Diagnosis not present

## 2014-06-19 DIAGNOSIS — I779 Disorder of arteries and arterioles, unspecified: Secondary | ICD-10-CM

## 2014-06-19 DIAGNOSIS — R42 Dizziness and giddiness: Secondary | ICD-10-CM | POA: Diagnosis not present

## 2014-06-19 DIAGNOSIS — I739 Peripheral vascular disease, unspecified: Secondary | ICD-10-CM

## 2014-06-19 DIAGNOSIS — R413 Other amnesia: Secondary | ICD-10-CM

## 2014-06-19 DIAGNOSIS — I480 Paroxysmal atrial fibrillation: Secondary | ICD-10-CM | POA: Diagnosis not present

## 2014-06-19 DIAGNOSIS — F419 Anxiety disorder, unspecified: Secondary | ICD-10-CM | POA: Diagnosis not present

## 2014-06-19 DIAGNOSIS — Z5181 Encounter for therapeutic drug level monitoring: Secondary | ICD-10-CM

## 2014-06-19 LAB — POCT INR: INR: 2.2

## 2014-06-19 NOTE — Assessment & Plan Note (Signed)
The patient had very slight carotid disease in the past. Chosen not to do a follow-up Doppler at this time.

## 2014-06-19 NOTE — Assessment & Plan Note (Signed)
Historically she had minimal coronary disease. No further workup.

## 2014-06-19 NOTE — Progress Notes (Signed)
Cardiology Office Note   Date:  06/19/2014   ID:  [name], DOB 10-27-1933, MRN [id number]  PCP:  Mathews Argyle, MD  Cardiologist:  Dola Argyle, MD   Chief Complaint  Patient presents with  . Appointment    Follow-up palpitations      History of Present Illness: [name] is a 81 y.o. female who presents today to follow-up history of some palpitations. There was a limited amount of atrial fib documented in the past. She is anticoagulated. She is had some problems with mild Alzheimer's. She has a mild tremor. She is aware of all these things and discuss them with me today. She knows that she is anxious about this also. She's not had any significant palpitations.  Past Medical History  Diagnosis Date  . Spinal stenosis   . Anxiety   . GERD (gastroesophageal reflux disease)   . Osteoporosis   . Paroxysmal atrial fibrillation     Coumadin therapy  . Hypertension   . Hyperlipidemia   . CAD (coronary artery disease) 2008/2010    Catheterization 2008, 40% LAD after first diagonal  /  nuclear January, 2010 normal  . Carotid artery disease     Doppler, July, 2011, 0-39% bilateral  . Endometrial polyp   . Atrophic vaginitis   . Vitamin D deficiency   . Stress   . Normal nuclear stress test Feb 2013    No ischemia. Normal wall motion. EF greater than 70%  . Osteopenia   . Palpitations     Monitor, May, 2013, no atrial fib,  one short run of PSVT  . Memory difficulty 01-20-13    "memory issues"  . Osteoarthritis   . Polymyalgia rheumatica   . Irritable bowel syndrome with constipation   . Decreased platelet count 04/2011    142,000    Past Surgical History  Procedure Laterality Date  . Laminectomy  March 2009    L4 through L5  . Cataract surgery  2009  . Tonsillectomy    . Hysteroscopy  02/07/2005    HYST W D&C, DX: PMB W SMALL ENDO POLYP-SURGEON [name]  . Breast surgery      BENIGN BREAST LUMP  . Excision morton's neuroma  2000  . Hip  surgery Right 2005    debridement  . Dilation and curettage of uterus    . Colonoscopy with propofol N/A 02/11/2013    Procedure: COLONOSCOPY WITH PROPOFOL;  Surgeon: [name], MD;  Location: [facility];  Service: Endoscopy;  Laterality: N/A;    Patient Active Problem List   Diagnosis Date Noted  . Memory loss or impairment 07/02/2013  . Encounter for therapeutic drug monitoring 04/25/2013  . Palpitations   . Osteopenia   . Chest pain 04/07/2011  . Spinal stenosis   . Anxiety   . Warfarin anticoagulation   . GERD (gastroesophageal reflux disease)   . Osteoporosis   . Paroxysmal atrial fibrillation   . Hypertension   . Hyperlipidemia   . Carotid artery disease   . CAD (coronary artery disease)   . Ejection fraction       Current Outpatient Prescriptions  Medication Sig Dispense Refill  . CALCIUM PO Take by mouth as directed.    . Calcium-Magnesium-Vitamin D (CALCIUM 500 PO) Take 1 tablet by mouth daily.    . Cyanocobalamin (VITAMIN B-12 PO) Take by mouth as directed.    . diltiazem (CARDIZEM CD) 120 MG 24 hr capsule Take 1 capsule (120 mg total) by  mouth daily. 90 capsule 1  . donepezil (ARICEPT) 10 MG tablet TAKE 1 TABLET BY MOUTH EVERY NIGHT AT BEDTIME 90 tablet 0  . FLUZONE HIGH-DOSE 0.5 ML SUSY   0  . ipratropium (ATROVENT) 0.06 % nasal spray Place into both nostrils every other day.     . lisinopril (PRINIVIL,ZESTRIL) 5 MG tablet 1 tablet daily.    [name] MELATONIN PO Take by mouth at bedtime as needed and may repeat dose one time if needed (FOR SLEEP).     . memantine (NAMENDA) 10 MG tablet 2 tablets at bedtime (Patient taking differently: Take 10 mg by mouth 2 (two) times daily. ) 60 tablet 2  . metoprolol tartrate (LOPRESSOR) 25 MG tablet Take 25 mg by mouth 2 (two) times daily.    . Multiple Vitamins-Minerals (OCUVITE PRESERVISION) TABS Take 1 each by mouth daily.      [name] omeprazole (PRILOSEC) 40 MG capsule Take 40 mg by mouth daily.  1  . warfarin (COUMADIN) 5  MG tablet Take as directed by anticoagulation clinic 90 tablet 1  . [DISCONTINUED] estradiol (ESTRACE) 1 MG tablet Take 1 mg by mouth as needed.      No current facility-administered medications for this visit.    Allergies:   Review of patient's allergies indicates no known allergies.    Social History:  The patient  reports that she has never smoked. She has never used smokeless tobacco. She reports that she does not drink alcohol or use illicit drugs.   Family History:  The patient's family history includes Cancer in her father; Dementia in her sister; Diabetes in her sister; Heart disease in her brother and sister; Hypertension in her brother and sister; Pancreatitis in her father; Spina bifida in her brother; Stroke in her mother and sister; Transient ischemic attack in her mother.    ROS:  Please see the history of present illness.     Patient denies fever, chills, headache, sweats, rash, change in vision, change in hearing, chest pain, cough, nausea or vomiting, urinary symptoms. All other systems are reviewed and are negative.    PHYSICAL EXAM: VS:  BP 148/58 mmHg  Pulse 56  Ht 5\' 2"  (1.575 m)  Wt 101 lb 12.8 oz (46.176 kg)  BMI 18.61 kg/m2 , The patient is oriented to person, time, and place. She discussed with me her concerns knowing that she is becoming more forgetful. Head is atraumatic. Sclera and conjunctiva are normal. There is no jugular venous distention. Lungs are clear. Respiratory effort is not labored. Cardiac exam reveals S1 and S2. Abdomen is soft. There is no peripheral edema. There are no musculoskeletal deformities. There are no skin rashes.   EKG:    EKG is done today and reviewed by me. There is mild sinus bradycardia. There are nonspecific ST changes. No significant change from the past.  Recent Labs: 07/03/2013: TSH 2.850    Lipid Panel    Component Value Date/Time   CHOL 200 01/22/2012 0826   TRIG 58.0 01/22/2012 0826   HDL 57.60 01/22/2012 0826    CHOLHDL 3 01/22/2012 0826   VLDL 11.6 01/22/2012 0826   LDLCALC 131* 01/22/2012 0826   LDLDIRECT 119.7 08/26/2009 1007      Wt Readings from Last 3 Encounters:  06/19/14 101 lb 12.8 oz (46.176 kg)  06/16/14 100 lb 8 oz (45.587 kg)  03/16/14 101 lb 12.8 oz (46.176 kg)      Current medicines are reviewed  The patient does understand her medications.  ASSESSMENT AND PLAN:

## 2014-06-19 NOTE — Patient Instructions (Signed)
Your physician recommends that you continue on your current medications as directed. Please refer to the Current Medication list given to you today.  Your physician wants you to follow-up in: 6 months with a member of our team. You will receive a reminder letter in the mail two months in advance. If you don't receive a letter, please call our office to schedule the follow-up appointment.

## 2014-06-19 NOTE — Assessment & Plan Note (Signed)
She is on Coumadin. It appears to be safe for her at this time. Plan to continue.

## 2014-06-19 NOTE — Assessment & Plan Note (Signed)
She has not had much in the way of palpitations. No further workup.

## 2014-06-19 NOTE — Assessment & Plan Note (Signed)
It is mention in the chart she has some dizziness. She did not complain of this today. I doubt that there is a cardiac basis. However she does have sinus bradycardia that is mild. She is on a small dose of metoprolol. I've chosen not to stop this because I believe it is helped her with her palpitations over time.

## 2014-06-19 NOTE — Assessment & Plan Note (Signed)
The patient knows that she is anxious and she is dealing with this appropriately.

## 2014-06-19 NOTE — Assessment & Plan Note (Signed)
She is aware that this is an issue. She is doing everything she can to help with the treatment.

## 2014-06-21 DIAGNOSIS — J018 Other acute sinusitis: Secondary | ICD-10-CM | POA: Diagnosis not present

## 2014-06-24 DIAGNOSIS — J029 Acute pharyngitis, unspecified: Secondary | ICD-10-CM | POA: Diagnosis not present

## 2014-06-24 DIAGNOSIS — R140 Abdominal distension (gaseous): Secondary | ICD-10-CM | POA: Diagnosis not present

## 2014-06-26 ENCOUNTER — Other Ambulatory Visit: Payer: Self-pay | Admitting: Cardiology

## 2014-06-26 DIAGNOSIS — S161XXA Strain of muscle, fascia and tendon at neck level, initial encounter: Secondary | ICD-10-CM | POA: Diagnosis not present

## 2014-06-29 ENCOUNTER — Encounter (HOSPITAL_COMMUNITY): Payer: Self-pay | Admitting: Emergency Medicine

## 2014-06-29 ENCOUNTER — Emergency Department (HOSPITAL_COMMUNITY)
Admission: EM | Admit: 2014-06-29 | Discharge: 2014-06-29 | Disposition: A | Discharge status: Home / Self Care | Payer: Medicare Other | Attending: Emergency Medicine | Admitting: Emergency Medicine

## 2014-06-29 ENCOUNTER — Emergency Department (HOSPITAL_COMMUNITY): Payer: Medicare Other

## 2014-06-29 DIAGNOSIS — Z8742 Personal history of other diseases of the female genital tract: Secondary | ICD-10-CM | POA: Insufficient documentation

## 2014-06-29 DIAGNOSIS — I10 Essential (primary) hypertension: Secondary | ICD-10-CM | POA: Insufficient documentation

## 2014-06-29 DIAGNOSIS — Z792 Long term (current) use of antibiotics: Secondary | ICD-10-CM | POA: Insufficient documentation

## 2014-06-29 DIAGNOSIS — Z7901 Long term (current) use of anticoagulants: Secondary | ICD-10-CM | POA: Insufficient documentation

## 2014-06-29 DIAGNOSIS — K219 Gastro-esophageal reflux disease without esophagitis: Secondary | ICD-10-CM | POA: Diagnosis not present

## 2014-06-29 DIAGNOSIS — Z86018 Personal history of other benign neoplasm: Secondary | ICD-10-CM | POA: Diagnosis not present

## 2014-06-29 DIAGNOSIS — M8580 Other specified disorders of bone density and structure, unspecified site: Secondary | ICD-10-CM | POA: Insufficient documentation

## 2014-06-29 DIAGNOSIS — F419 Anxiety disorder, unspecified: Secondary | ICD-10-CM | POA: Insufficient documentation

## 2014-06-29 DIAGNOSIS — M5032 Other cervical disc degeneration, mid-cervical region: Secondary | ICD-10-CM | POA: Diagnosis not present

## 2014-06-29 DIAGNOSIS — M810 Age-related osteoporosis without current pathological fracture: Secondary | ICD-10-CM | POA: Diagnosis not present

## 2014-06-29 DIAGNOSIS — Z79899 Other long term (current) drug therapy: Secondary | ICD-10-CM | POA: Diagnosis not present

## 2014-06-29 DIAGNOSIS — R55 Syncope and collapse: Secondary | ICD-10-CM | POA: Diagnosis present

## 2014-06-29 DIAGNOSIS — M542 Cervicalgia: Secondary | ICD-10-CM

## 2014-06-29 DIAGNOSIS — I2510 Atherosclerotic heart disease of native coronary artery without angina pectoris: Secondary | ICD-10-CM | POA: Diagnosis not present

## 2014-06-29 DIAGNOSIS — I480 Paroxysmal atrial fibrillation: Secondary | ICD-10-CM | POA: Diagnosis not present

## 2014-06-29 DIAGNOSIS — M47892 Other spondylosis, cervical region: Secondary | ICD-10-CM | POA: Diagnosis not present

## 2014-06-29 DIAGNOSIS — M47893 Other spondylosis, cervicothoracic region: Secondary | ICD-10-CM | POA: Diagnosis not present

## 2014-06-29 DIAGNOSIS — E559 Vitamin D deficiency, unspecified: Secondary | ICD-10-CM | POA: Insufficient documentation

## 2014-06-29 DIAGNOSIS — M9971 Connective tissue and disc stenosis of intervertebral foramina of cervical region: Secondary | ICD-10-CM | POA: Diagnosis not present

## 2014-06-29 LAB — CBC WITH DIFFERENTIAL/PLATELET
Basophils Absolute: 0 10*3/uL (ref 0.0–0.1)
Basophils Relative: 0 % (ref 0–1)
Eosinophils Absolute: 0 10*3/uL (ref 0.0–0.7)
Eosinophils Relative: 0 % (ref 0–5)
HCT: 37.7 % (ref 36.0–46.0)
Hemoglobin: 12.3 g/dL (ref 12.0–15.0)
Lymphocytes Relative: 10 % — ABNORMAL LOW (ref 12–46)
Lymphs Abs: 0.9 10*3/uL (ref 0.7–4.0)
MCH: 31.8 pg (ref 26.0–34.0)
MCHC: 32.6 g/dL (ref 30.0–36.0)
MCV: 97.4 fL (ref 78.0–100.0)
Monocytes Absolute: 1.1 10*3/uL — ABNORMAL HIGH (ref 0.1–1.0)
Monocytes Relative: 12 % (ref 3–12)
NEUTROS PCT: 78 % — AB (ref 43–77)
Neutro Abs: 7.3 10*3/uL (ref 1.7–7.7)
PLATELETS: 136 10*3/uL — AB (ref 150–400)
RBC: 3.87 MIL/uL (ref 3.87–5.11)
RDW: 12.6 % (ref 11.5–15.5)
WBC: 9.3 10*3/uL (ref 4.0–10.5)

## 2014-06-29 LAB — BASIC METABOLIC PANEL
Anion gap: 7 (ref 5–15)
BUN: 11 mg/dL (ref 6–23)
CO2: 29 mmol/L (ref 19–32)
Calcium: 9.1 mg/dL (ref 8.4–10.5)
Chloride: 103 mmol/L (ref 96–112)
Creatinine, Ser: 0.76 mg/dL (ref 0.50–1.10)
GFR calc Af Amer: >90 mL/min (ref >90)
GFR, EST NON AFRICAN AMERICAN: 78 mL/min — AB (ref >90)
GLUCOSE: 111 mg/dL — AB (ref 70–99)
POTASSIUM: 3.7 mmol/L (ref 3.5–5.1)
SODIUM: 139 mmol/L (ref 135–145)

## 2014-06-29 LAB — TROPONIN I

## 2014-06-29 LAB — PROTIME-INR
INR: 1.57 — ABNORMAL HIGH (ref 0.00–1.49)
Prothrombin Time: 18.9 seconds — ABNORMAL HIGH (ref 11.6–15.2)

## 2014-06-29 MED ORDER — OXYCODONE-ACETAMINOPHEN 5-325 MG PO TABS
1.0000 | ORAL_TABLET | Freq: Once | ORAL | Status: AC
  Administered 2014-06-29: 1 via ORAL
  Filled 2014-06-29: qty 1

## 2014-06-29 MED ORDER — PREDNISONE 20 MG PO TABS: 40.0000 mg | ORAL_TABLET | Freq: Every day | ORAL | Status: DC

## 2014-06-29 MED ORDER — KETOROLAC TROMETHAMINE 60 MG/2ML IM SOLN
30.0000 mg | Freq: Once | INTRAMUSCULAR | Status: AC
  Administered 2014-06-29: 30 mg via INTRAMUSCULAR
  Filled 2014-06-29: qty 2

## 2014-06-29 NOTE — Discharge Instructions (Signed)

## 2014-06-29 NOTE — ED Provider Notes (Signed)
CSN: [id number]     Arrival date & time 06/29/14  7124 History   First MD Initiated Contact with Patient 06/29/14 [phone]     Chief Complaint  Patient presents with  . Shoulder Pain  . Loss of Consciousness     (Consider location/radiation/quality/duration/timing/severity/associated sxs/prior Treatment) HPI Comments: Patient here complaining of 3 days of left-sided neck pain characterized as sharp and worse with movement. Denies any history of trauma. Went to urgent care and was prescribed cyclobenzaprine and Ultram. Medications made her sleepy but did not help her pain. Denies any numbness or tingling to her left arm or left hand. No associated chest pain or shortness of breath. Denies any palpitations. Did become dizzy yesterday and weak when she was standing for prolonged period of time. She had near syncopal event with this. Denies any fever, chills, vomiting, diarrhea. No peripheral weakness noted. Symptoms better with rest and worse with certain movements.  Patient is a 81 y.o. female presenting with shoulder pain and syncope. The history is provided by the patient.  Shoulder Pain Loss of Consciousness   Past Medical History  Diagnosis Date  . Spinal stenosis   . Anxiety   . GERD (gastroesophageal reflux disease)   . Osteoporosis   . Paroxysmal atrial fibrillation     Coumadin therapy  . Hypertension   . Hyperlipidemia   . CAD (coronary artery disease) 2008/2010    Catheterization 2008, 40% LAD after first diagonal  /  nuclear January, 2010 normal  . Carotid artery disease     Doppler, July, 2011, 0-39% bilateral  . Endometrial polyp   . Atrophic vaginitis   . Vitamin D deficiency   . Stress   . Normal nuclear stress test Feb 2013    No ischemia. Normal wall motion. EF greater than 70%  . Osteopenia   . Palpitations     Monitor, May, 2013, no atrial fib,  one short run of PSVT  . Memory difficulty 01-20-13    "memory issues"  . Osteoarthritis   . Polymyalgia rheumatica    . Irritable bowel syndrome with constipation   . Decreased platelet count 04/2011    142,000   Past Surgical History  Procedure Laterality Date  . Laminectomy  March 2009    L4 through L5  . Cataract surgery  2009  . Tonsillectomy    . Hysteroscopy  02/07/2005    HYST W D&C, DX: PMB W SMALL ENDO POLYP-SURGEON [name]  . Breast surgery      BENIGN BREAST LUMP  . Excision morton's neuroma  2000  . Hip surgery Right 2005    debridement  . Dilation and curettage of uterus    . Colonoscopy with propofol N/A 02/11/2013    Procedure: COLONOSCOPY WITH PROPOFOL;  Surgeon: [name], MD;  Location: [facility];  Service: Endoscopy;  Laterality: N/A;   Family History  Problem Relation Age of Onset  . Transient ischemic attack Mother   . Stroke Mother   . Cancer Father     PROSTATE  . Pancreatitis Father   . Stroke Sister   . Spina bifida Brother   . Hypertension Brother   . Heart disease Brother   . Hypertension Sister   . Diabetes Sister   . Heart disease Sister   . Dementia Sister    History  Substance Use Topics  . Smoking status: Never Smoker   . Smokeless tobacco: Never Used  . Alcohol Use: No   OB History  Gravida Para Term Preterm AB TAB SAB Ectopic Multiple Living   4 2 2  2     2      Review of Systems  Cardiovascular: Positive for syncope.  All other systems reviewed and are negative.     Allergies  Review of patient's allergies indicates no known allergies.  Home Medications   Prior to Admission medications   Medication Sig Start Date End Date Taking? Authorizing Provider  CALCIUM PO Take by mouth as directed.    Historical Provider, MD  Calcium-Magnesium-Vitamin D (CALCIUM 500 PO) Take 1 tablet by mouth daily.    Historical Provider, MD  CARTIA XT 120 MG 24 hr capsule TAKE 1 CAPSULE BY MOUTH DAILY 06/26/14   Carlena Bjornstad, MD  Cyanocobalamin (VITAMIN B-12 PO) Take by mouth as directed.    Historical Provider, MD  donepezil (ARICEPT) 10 MG  tablet TAKE 1 TABLET BY MOUTH EVERY NIGHT AT BEDTIME 04/28/14   Pieter Partridge, DO  FLUZONE HIGH-DOSE 0.5 ML SUSY  12/11/13   Historical Provider, MD  ipratropium (ATROVENT) 0.06 % nasal spray Place into both nostrils every other day.  11/06/13   Historical Provider, MD  lisinopril (PRINIVIL,ZESTRIL) 5 MG tablet 1 tablet daily. 09/12/13   Historical Provider, MD  MELATONIN PO Take by mouth at bedtime as needed and may repeat dose one time if needed (FOR SLEEP).     Historical Provider, MD  memantine (NAMENDA) 10 MG tablet 2 tablets at bedtime Patient taking differently: Take 10 mg by mouth 2 (two) times daily.  05/08/14   Pieter Partridge, DO  metoprolol tartrate (LOPRESSOR) 25 MG tablet Take 25 mg by mouth 2 (two) times daily.    Historical Provider, MD  Multiple Vitamins-Minerals (OCUVITE PRESERVISION) TABS Take 1 each by mouth daily.      Historical Provider, MD  omeprazole (PRILOSEC) 40 MG capsule Take 40 mg by mouth daily. 06/11/14   Historical Provider, MD  warfarin (COUMADIN) 5 MG tablet TAKE AS DIRECTED BY ANTICOAGULATION CLINIC 06/26/14   Carlena Bjornstad, MD   There were no vitals taken for this visit. Physical Exam  Constitutional: She is oriented to person, place, and time. She appears well-developed and well-nourished.  Non-toxic appearance. No distress.  HENT:  Head: Normocephalic and atraumatic.  Eyes: Conjunctivae, EOM and lids are normal. Pupils are equal, round, and reactive to light.  Neck: Neck supple. Muscular tenderness present. No spinous process tenderness present. No rigidity. Decreased range of motion present. No tracheal deviation present. No thyroid mass present.    Cardiovascular: Normal rate, regular rhythm and normal heart sounds.  Exam reveals no gallop.   No murmur heard. Pulmonary/Chest: Effort normal and breath sounds normal. No stridor. No respiratory distress. She has no decreased breath sounds. She has no wheezes. She has no rhonchi. She has no rales.  Abdominal: Soft.  Normal appearance and bowel sounds are normal. She exhibits no distension. There is no tenderness. There is no rebound and no CVA tenderness.  Musculoskeletal: She exhibits no edema or tenderness.  Neurological: She is alert and oriented to person, place, and time. She has normal strength. No cranial nerve deficit or sensory deficit. GCS eye subscore is 4. GCS verbal subscore is 5. GCS motor subscore is 6.  Skin: Skin is warm and dry. No abrasion and no rash noted.  Psychiatric: She has a normal mood and affect. Her speech is normal and behavior is normal.  Nursing note and vitals reviewed.   ED Course  Procedures (including critical care time) Labs Review Labs Reviewed  CBC WITH DIFFERENTIAL/PLATELET  BASIC METABOLIC PANEL  TROPONIN I    Imaging Review No results found.   EKG Interpretation   Date/Time:  Monday June 29 2014 08:30:26 EDT Ventricular Rate:  65 PR Interval:  144 QRS Duration: 75 QT Interval:  353 QTC Calculation: 367 R Axis:   38 Text Interpretation:  Sinus rhythm Minimal ST depression, lateral leads No  significant change since last tracing Confirmed by Janasia Coverdale  MD, Carless Slatten  ([id number]) on 06/29/2014 8:40:48 AM      MDM   Final diagnoses:  Neck pain     Pt given meds here feels better. Suspect DJD is causing her symptoms. Stable for discharge   Lacretia Leigh, MD 06/29/14 1301

## 2014-06-29 NOTE — ED Notes (Signed)
Pt c/o left shoulder pain x 1 week. Pt seen at an urgent care for it and given pain medication. Pt was told if she was not better by Monday to follow up with her Dr.  Maryjane Hurter night pt had witnessed near syncopal episode, pt does not recall event.  Husband reports that pt was standing in the kitchen and seemed to space off.  Husband attempted to help pt sit down when pt became limp in his arms.

## 2014-06-29 NOTE — ED Notes (Signed)
Patient transported to CT 

## 2014-07-09 ENCOUNTER — Ambulatory Visit (INDEPENDENT_AMBULATORY_CARE_PROVIDER_SITE_OTHER): Payer: Medicare Other | Admitting: *Deleted

## 2014-07-09 DIAGNOSIS — I480 Paroxysmal atrial fibrillation: Secondary | ICD-10-CM | POA: Diagnosis not present

## 2014-07-09 DIAGNOSIS — I4891 Unspecified atrial fibrillation: Secondary | ICD-10-CM

## 2014-07-09 DIAGNOSIS — Z7901 Long term (current) use of anticoagulants: Secondary | ICD-10-CM

## 2014-07-09 DIAGNOSIS — Z23 Encounter for immunization: Secondary | ICD-10-CM | POA: Diagnosis not present

## 2014-07-09 DIAGNOSIS — Z5181 Encounter for therapeutic drug level monitoring: Secondary | ICD-10-CM | POA: Diagnosis not present

## 2014-07-09 LAB — POCT INR: INR: 3.2

## 2014-07-10 DIAGNOSIS — M2041 Other hammer toe(s) (acquired), right foot: Secondary | ICD-10-CM | POA: Diagnosis not present

## 2014-07-10 DIAGNOSIS — M659 Synovitis and tenosynovitis, unspecified: Secondary | ICD-10-CM | POA: Diagnosis not present

## 2014-07-13 DIAGNOSIS — H3530 Unspecified macular degeneration: Secondary | ICD-10-CM | POA: Diagnosis not present

## 2014-07-13 DIAGNOSIS — Z961 Presence of intraocular lens: Secondary | ICD-10-CM | POA: Diagnosis not present

## 2014-07-13 DIAGNOSIS — H43813 Vitreous degeneration, bilateral: Secondary | ICD-10-CM | POA: Diagnosis not present

## 2014-07-27 ENCOUNTER — Ambulatory Visit (INDEPENDENT_AMBULATORY_CARE_PROVIDER_SITE_OTHER): Payer: Medicare Other | Admitting: *Deleted

## 2014-07-27 DIAGNOSIS — Z5181 Encounter for therapeutic drug level monitoring: Secondary | ICD-10-CM

## 2014-07-27 DIAGNOSIS — I480 Paroxysmal atrial fibrillation: Secondary | ICD-10-CM | POA: Diagnosis not present

## 2014-07-27 DIAGNOSIS — I4891 Unspecified atrial fibrillation: Secondary | ICD-10-CM

## 2014-07-27 DIAGNOSIS — Z7901 Long term (current) use of anticoagulants: Secondary | ICD-10-CM

## 2014-07-27 LAB — POCT INR: INR: 1.8

## 2014-07-29 DIAGNOSIS — L821 Other seborrheic keratosis: Secondary | ICD-10-CM | POA: Diagnosis not present

## 2014-07-29 DIAGNOSIS — L814 Other melanin hyperpigmentation: Secondary | ICD-10-CM | POA: Diagnosis not present

## 2014-07-29 DIAGNOSIS — D1800 Hemangioma unspecified site: Secondary | ICD-10-CM | POA: Diagnosis not present

## 2014-08-05 ENCOUNTER — Other Ambulatory Visit: Payer: Self-pay | Admitting: *Deleted

## 2014-08-05 MED ORDER — MEMANTINE HCL 10 MG PO TABS: ORAL_TABLET | ORAL | Status: DC

## 2014-08-05 MED ORDER — DONEPEZIL HCL 10 MG PO TABS: 10.0000 mg | ORAL_TABLET | Freq: Every day | ORAL | Status: DC

## 2014-08-05 NOTE — Telephone Encounter (Signed)
Patient requesting a refill 

## 2014-08-13 ENCOUNTER — Ambulatory Visit (INDEPENDENT_AMBULATORY_CARE_PROVIDER_SITE_OTHER): Payer: Medicare Other | Admitting: *Deleted

## 2014-08-13 DIAGNOSIS — I4891 Unspecified atrial fibrillation: Secondary | ICD-10-CM | POA: Diagnosis not present

## 2014-08-13 DIAGNOSIS — Z7901 Long term (current) use of anticoagulants: Secondary | ICD-10-CM

## 2014-08-13 DIAGNOSIS — I480 Paroxysmal atrial fibrillation: Secondary | ICD-10-CM | POA: Diagnosis not present

## 2014-08-13 DIAGNOSIS — Z5181 Encounter for therapeutic drug level monitoring: Secondary | ICD-10-CM

## 2014-08-13 LAB — POCT INR: INR: 2.4

## 2014-08-20 DIAGNOSIS — Z1231 Encounter for screening mammogram for malignant neoplasm of breast: Secondary | ICD-10-CM | POA: Diagnosis not present

## 2014-08-25 DIAGNOSIS — R490 Dysphonia: Secondary | ICD-10-CM | POA: Diagnosis not present

## 2014-08-25 DIAGNOSIS — J310 Chronic rhinitis: Secondary | ICD-10-CM | POA: Diagnosis not present

## 2014-09-03 ENCOUNTER — Ambulatory Visit (INDEPENDENT_AMBULATORY_CARE_PROVIDER_SITE_OTHER): Payer: Medicare Other

## 2014-09-03 DIAGNOSIS — Z5181 Encounter for therapeutic drug level monitoring: Secondary | ICD-10-CM

## 2014-09-03 DIAGNOSIS — I480 Paroxysmal atrial fibrillation: Secondary | ICD-10-CM | POA: Diagnosis not present

## 2014-09-03 DIAGNOSIS — Z7901 Long term (current) use of anticoagulants: Secondary | ICD-10-CM

## 2014-09-03 DIAGNOSIS — I4891 Unspecified atrial fibrillation: Secondary | ICD-10-CM

## 2014-09-03 LAB — POCT INR: INR: 2.3

## 2014-09-30 DIAGNOSIS — K219 Gastro-esophageal reflux disease without esophagitis: Secondary | ICD-10-CM | POA: Diagnosis not present

## 2014-09-30 DIAGNOSIS — R252 Cramp and spasm: Secondary | ICD-10-CM | POA: Diagnosis not present

## 2014-09-30 DIAGNOSIS — Z0000 Encounter for general adult medical examination without abnormal findings: Secondary | ICD-10-CM | POA: Diagnosis not present

## 2014-09-30 DIAGNOSIS — I10 Essential (primary) hypertension: Secondary | ICD-10-CM | POA: Diagnosis not present

## 2014-09-30 DIAGNOSIS — I872 Venous insufficiency (chronic) (peripheral): Secondary | ICD-10-CM | POA: Diagnosis not present

## 2014-09-30 DIAGNOSIS — Z79899 Other long term (current) drug therapy: Secondary | ICD-10-CM | POA: Diagnosis not present

## 2014-09-30 DIAGNOSIS — Z1389 Encounter for screening for other disorder: Secondary | ICD-10-CM | POA: Diagnosis not present

## 2014-09-30 DIAGNOSIS — J300 Vasomotor rhinitis: Secondary | ICD-10-CM | POA: Diagnosis not present

## 2014-09-30 DIAGNOSIS — I482 Chronic atrial fibrillation: Secondary | ICD-10-CM | POA: Diagnosis not present

## 2014-09-30 DIAGNOSIS — E46 Unspecified protein-calorie malnutrition: Secondary | ICD-10-CM | POA: Diagnosis not present

## 2014-10-01 ENCOUNTER — Ambulatory Visit (INDEPENDENT_AMBULATORY_CARE_PROVIDER_SITE_OTHER): Payer: Medicare Other

## 2014-10-01 DIAGNOSIS — I4891 Unspecified atrial fibrillation: Secondary | ICD-10-CM

## 2014-10-01 DIAGNOSIS — I480 Paroxysmal atrial fibrillation: Secondary | ICD-10-CM

## 2014-10-01 DIAGNOSIS — Z5181 Encounter for therapeutic drug level monitoring: Secondary | ICD-10-CM | POA: Diagnosis not present

## 2014-10-01 DIAGNOSIS — Z7901 Long term (current) use of anticoagulants: Secondary | ICD-10-CM

## 2014-10-01 LAB — POCT INR: INR: 2.3

## 2014-10-15 ENCOUNTER — Ambulatory Visit (INDEPENDENT_AMBULATORY_CARE_PROVIDER_SITE_OTHER): Payer: Medicare Other | Admitting: Family Medicine

## 2014-10-15 VITALS — BP 132/80 | HR 61 | Temp 97.7°F | Resp 17 | Ht 63.0 in | Wt 101.0 lb

## 2014-10-15 DIAGNOSIS — L237 Allergic contact dermatitis due to plants, except food: Secondary | ICD-10-CM | POA: Diagnosis not present

## 2014-10-15 DIAGNOSIS — I2510 Atherosclerotic heart disease of native coronary artery without angina pectoris: Secondary | ICD-10-CM | POA: Diagnosis not present

## 2014-10-15 DIAGNOSIS — Z79899 Other long term (current) drug therapy: Secondary | ICD-10-CM

## 2014-10-15 LAB — GLUCOSE, POCT (MANUAL RESULT ENTRY): POC Glucose: 123 mg/dl — AB (ref 70–99)

## 2014-10-15 MED ORDER — PREDNISONE 20 MG PO TABS: ORAL_TABLET | ORAL | Status: DC

## 2014-10-15 NOTE — Progress Notes (Addendum)
Subjective:  This chart was scribed for Donna Parish, MD by Leandra Kern, Medical Scribe. This patient was seen in Room 9 and the patient's care was started at 8:27 AM.   Patient ID: [name], female    DOB: April 15, 1933, 80 y.o.   MRN: [id number]  Chief Complaint  Patient presents with  . Rash    all over of unknown origin     HPI HPI Comments: [name] is a 81 y.o. female who presents to [facility] complaining of rash all over her body, onset three days ago.  She has a history of multiple medical problems including anti coagulation for Afib and CAD. Cardiologist is Dr. Radene Ou. Last INR is 2.3 in July 21st.  Today, pt notes that her symptoms could be due to her work in the yard pulling leaves about 5 days ago. She reports that the rash spread all over her body, she does not recall where it precisely started however. Pt indicates that the affected areas are very itchy to the point that it keeps her up at night. She states that pt used a cortisone cream twice a day for her rash. She denies any rash on her face area, or trouble breathing. Pt notes that she has taken prednisone previously for her shoulder, and she does not recall experiencing problems with it.   Pt states that she has early stages of dementia.    Patient Active Problem List   Diagnosis Date Noted  . Dizziness 06/19/2014  . Memory loss or impairment 07/02/2013  . Encounter for therapeutic drug monitoring 04/25/2013  . Palpitations   . Osteopenia   . Chest pain 04/07/2011  . Spinal stenosis   . Anxiety   . Warfarin anticoagulation   . GERD (gastroesophageal reflux disease)   . Osteoporosis   . Paroxysmal atrial fibrillation   . Hypertension   . Hyperlipidemia   . Carotid artery disease   . CAD (coronary artery disease)   . Ejection fraction    Past Medical History  Diagnosis Date  . Spinal stenosis   . Anxiety   . GERD (gastroesophageal reflux disease)   . Osteoporosis     . Paroxysmal atrial fibrillation     Coumadin therapy  . Hypertension   . Hyperlipidemia   . CAD (coronary artery disease) 2008/2010    Catheterization 2008, 40% LAD after first diagonal  /  nuclear January, 2010 normal  . Carotid artery disease     Doppler, July, 2011, 0-39% bilateral  . Endometrial polyp   . Atrophic vaginitis   . Vitamin D deficiency   . Stress   . Normal nuclear stress test Feb 2013    No ischemia. Normal wall motion. EF greater than 70%  . Osteopenia   . Palpitations     Monitor, May, 2013, no atrial fib,  one short run of PSVT  . Memory difficulty 01-20-13    "memory issues"  . Osteoarthritis   . Polymyalgia rheumatica   . Irritable bowel syndrome with constipation   . Decreased platelet count 04/2011    142,000   Past Surgical History  Procedure Laterality Date  . Laminectomy  March 2009    L4 through L5  . Cataract surgery  2009  . Tonsillectomy    . Hysteroscopy  02/07/2005    HYST W D&C, DX: PMB W SMALL ENDO POLYP-SURGEON [name]  . Breast surgery      BENIGN BREAST LUMP  . Excision  morton's neuroma  2000  . Hip surgery Right 2005    debridement  . Dilation and curettage of uterus    . Colonoscopy with propofol N/A 02/11/2013    Procedure: COLONOSCOPY WITH PROPOFOL;  Surgeon: [name], MD;  Location: [facility];  Service: Endoscopy;  Laterality: N/A;   No Known Allergies Prior to Admission medications   Medication Sig Start Date End Date Taking? Authorizing Provider  Calcium-Magnesium-Vitamin D (CALCIUM 500 PO) Take 1 tablet by mouth daily.   Yes Historical Provider, MD  CARTIA XT 120 MG 24 hr capsule TAKE 1 CAPSULE BY MOUTH DAILY 06/26/14  Yes Carlena Bjornstad, MD  Cyanocobalamin (VITAMIN B-12 PO) Take 1 tablet by mouth daily.    Yes Historical Provider, MD  lisinopril (PRINIVIL,ZESTRIL) 5 MG tablet Take 5 mg by mouth daily.  09/12/13  Yes Historical Provider, MD  MELATONIN PO Take by mouth at bedtime as needed and may repeat dose  one time if needed (FOR SLEEP).    Yes Historical Provider, MD  memantine (NAMENDA) 10 MG tablet 2 tablets at bedtime 08/05/14  Yes Adam Telford Nab, DO  metoprolol tartrate (LOPRESSOR) 25 MG tablet Take 25 mg by mouth 2 (two) times daily.   Yes Historical Provider, MD  Multiple Vitamins-Minerals (OCUVITE PRESERVISION) TABS Take 1 each by mouth daily.     Yes Historical Provider, MD  warfarin (COUMADIN) 5 MG tablet TAKE AS DIRECTED BY ANTICOAGULATION CLINIC 06/26/14  Yes Carlena Bjornstad, MD  predniSONE (DELTASONE) 20 MG tablet Take 2 tablets (40 mg total) by mouth daily. Patient not taking: Reported on 10/15/2014 06/29/14   Lacretia Leigh, MD  traMADol (ULTRAM) 50 MG tablet Take 50 mg by mouth every 6 (six) hours as needed for moderate pain.    Historical Provider, MD   History   Social History  . Marital Status: Married    Spouse Name: Jeneen Rinks  . Number of Children: 2  . Years of Education: Masters   Occupational History  . Retired    Social History Main Topics  . Smoking status: Never Smoker   . Smokeless tobacco: Never Used  . Alcohol Use: No  . Drug Use: No  . Sexual Activity: No   Other Topics Concern  . Not on file   Social History Narrative   Patient is married Jeneen Rinks) and lives at home with her husband.   Patient has two children.   Retired; Married;   Patient is right-handed.   Patient drinks very little caffeine.   Patient has a Scientist, water quality.   Has had increased stress as her husband had a stroke ~ 3wks ago and she has been taking care of him. (04/07/11)     Review of Systems  Respiratory: Negative for shortness of breath.   Skin: Positive for rash.       itching  Psychiatric/Behavioral: Positive for sleep disturbance.       Objective:   Physical Exam  Constitutional: She is oriented to person, place, and time. She appears well-developed and well-nourished. No distress.  HENT:  Head: Normocephalic and atraumatic.  Eyes: EOM are normal. Pupils are equal, round, and  reactive to light.  Neck: Neck supple.  Cardiovascular: Normal rate, regular rhythm and normal heart sounds.   Pulmonary/Chest: Effort normal and breath sounds normal. No respiratory distress. She has no wheezes. She has no rales.  Neurological: She is alert and oriented to person, place, and time. No cranial nerve deficit.  Skin: Skin is warm and dry.  Multiple areas of clustered vesicular lesions on left wrist, right forearm, and right wrist, left forearm, Left thump, left fifth finger. Few linear areas of vascular rash on forearms and legs. Number of clustered vesicular/ linear rashes on and left and right led, left is more prominent. Few scattered area on the upper back. Few smaller areas on the lower mid back. Few small areas on her right left neck and chin. She does have few patched on her left lower cheek. No oral lesions   Psychiatric: She has a normal mood and affect. Her behavior is normal.  Nursing note and vitals reviewed.    Filed Vitals:   10/15/14 0819  BP: 132/80  Pulse: 61  Temp: 97.7 F (36.5 C)  TempSrc: Oral  Resp: 17  Height: 5\' 3"  (1.6 m)  Weight: 101 lb (45.813 kg)  SpO2: 98%   Results for orders placed or performed in visit on 10/15/14  POCT glucose (manual entry)  Result Value Ref Range   POC Glucose 123 (A) 70 - 99 mg/dl       Assessment & Plan:   CHANIYAH JAHR is a 81 y.o. female High risk medication use - Plan: POCT glucose (manual entry)  Poison ivy - Plan: predniSONE (DELTASONE) 20 MG tablet  Contact dermatitis due to poison ivy, coursing back. Now with spread beyond arms and legs to neck, back, face. No mucosal lesions, no fever, no other systemic symptoms.  -Prednisone 40 mg per day for 4 days, then taper to 20 mg for 2 days then 10 mg for 2 days.   -Side effects discussed of prednisone. RTC/ER precautions discussed.  -Glucose not quite normal, needs to be followed up with primary care provider, but does not appear to be in diabetes range  today and nonfasting.  RTC precautions discussed with understanding expressed.  Meds ordered this encounter  Medications  . predniSONE (DELTASONE) 20 MG tablet    Sig: 2 by mouth for 4 days, then 1 by mouth for 2 days, then 1/2 by mouth for 2 days.    Dispense:  16 tablet    Refill:  0   Patient Instructions  The rash appears to be due to poison ivy. As it has spread to multiple areas including your face and neck, I think we should start prednisone. This can increase appetite, interfere with sleep, and cause some agitation. However if you have any change in mental status with hallucinations or other worsening symptoms, return here or the emergency room. If this rash does not start to improve within the next 4 to 5 days, or any worsening sooner - recheck here or emergency room.  Poison [employer] ivy is a inflammation of the skin (contact dermatitis) caused by touching the allergens on the leaves of the ivy plant following previous exposure to the plant. The rash usually appears 48 hours after exposure. The rash is usually bumps (papules) or blisters (vesicles) in a linear pattern. Depending on your own sensitivity, the rash may simply cause redness and itching, or it may also progress to blisters which may break open. These must be well cared for to prevent secondary bacterial (germ) infection, followed by scarring. Keep any open areas dry, clean, dressed, and covered with an antibacterial ointment if needed. The eyes may also get puffy. The puffiness is worst in the morning and gets better as the day progresses. This dermatitis usually heals without scarring, within 2 to 3 weeks without treatment. HOME CARE INSTRUCTIONS  Thoroughly wash with  soap and water as soon as you have been exposed to poison ivy. You have about one half hour to remove the plant resin before it will cause the rash. This washing will destroy the oil or antigen on the skin that is causing, or will cause, the rash. Be sure to  wash under your fingernails as any plant resin there will continue to spread the rash. Do not rub skin vigorously when washing affected area. Poison ivy cannot spread if no oil from the plant remains on your body. A rash that has progressed to weeping sores will not spread the rash unless you have not washed thoroughly. It is also important to wash any clothes you have been wearing as these may carry active allergens. The rash will return if you wear the unwashed clothing, even several days later. Avoidance of the plant in the future is the best measure. Poison ivy plant can be recognized by the number of leaves. Generally, poison ivy has three leaves with flowering branches on a single stem. Diphenhydramine may be purchased over the counter and used as needed for itching. Do not drive with this medication if it makes you drowsy.Ask your caregiver about medication for children. SEEK MEDICAL CARE IF:  Open sores develop.  Redness spreads beyond area of rash.  You notice purulent (pus-like) discharge.  You have increased pain.  Other signs of infection develop (such as fever). Document Released: 02/25/2000 Document Revised: 05/22/2011 Document Reviewed: 08/07/2008 [facility] Patient Information 2015 [place], [place]. This information is not intended to replace advice given to you by your health care provider. Make sure you discuss any questions you have with your health care provider.       I personally performed the services described in this documentation, which was scribed in my presence. The recorded information has been reviewed and considered, and addended by me as needed.

## 2014-10-15 NOTE — Patient Instructions (Signed)
The rash appears to be due to poison ivy. As it has spread to multiple areas including your face and neck, I think we should start prednisone. This can increase appetite, interfere with sleep, and cause some agitation. However if you have any change in mental status with hallucinations or other worsening symptoms, return here or the emergency room. If this rash does not start to improve within the next 4 to 5 days, or any worsening sooner - recheck here or emergency room.  Poison [employer] ivy is a inflammation of the skin (contact dermatitis) caused by touching the allergens on the leaves of the ivy plant following previous exposure to the plant. The rash usually appears 48 hours after exposure. The rash is usually bumps (papules) or blisters (vesicles) in a linear pattern. Depending on your own sensitivity, the rash may simply cause redness and itching, or it may also progress to blisters which may break open. These must be well cared for to prevent secondary bacterial (germ) infection, followed by scarring. Keep any open areas dry, clean, dressed, and covered with an antibacterial ointment if needed. The eyes may also get puffy. The puffiness is worst in the morning and gets better as the day progresses. This dermatitis usually heals without scarring, within 2 to 3 weeks without treatment. HOME CARE INSTRUCTIONS  Thoroughly wash with soap and water as soon as you have been exposed to poison ivy. You have about one half hour to remove the plant resin before it will cause the rash. This washing will destroy the oil or antigen on the skin that is causing, or will cause, the rash. Be sure to wash under your fingernails as any plant resin there will continue to spread the rash. Do not rub skin vigorously when washing affected area. Poison ivy cannot spread if no oil from the plant remains on your body. A rash that has progressed to weeping sores will not spread the rash unless you have not washed thoroughly. It  is also important to wash any clothes you have been wearing as these may carry active allergens. The rash will return if you wear the unwashed clothing, even several days later. Avoidance of the plant in the future is the best measure. Poison ivy plant can be recognized by the number of leaves. Generally, poison ivy has three leaves with flowering branches on a single stem. Diphenhydramine may be purchased over the counter and used as needed for itching. Do not drive with this medication if it makes you drowsy.Ask your caregiver about medication for children. SEEK MEDICAL CARE IF:  Open sores develop.  Redness spreads beyond area of rash.  You notice purulent (pus-like) discharge.  You have increased pain.  Other signs of infection develop (such as fever). Document Released: 02/25/2000 Document Revised: 05/22/2011 Document Reviewed: 08/07/2008 [facility] Patient Information 2015 [place], [place]. This information is not intended to replace advice given to you by your health care provider. Make sure you discuss any questions you have with your health care provider.

## 2014-10-30 ENCOUNTER — Ambulatory Visit (INDEPENDENT_AMBULATORY_CARE_PROVIDER_SITE_OTHER): Payer: Medicare Other

## 2014-10-30 DIAGNOSIS — I4891 Unspecified atrial fibrillation: Secondary | ICD-10-CM

## 2014-10-30 DIAGNOSIS — I480 Paroxysmal atrial fibrillation: Secondary | ICD-10-CM

## 2014-10-30 DIAGNOSIS — Z5181 Encounter for therapeutic drug level monitoring: Secondary | ICD-10-CM

## 2014-10-30 DIAGNOSIS — Z7901 Long term (current) use of anticoagulants: Secondary | ICD-10-CM

## 2014-10-30 LAB — POCT INR: INR: 2.3

## 2014-11-11 DIAGNOSIS — J300 Vasomotor rhinitis: Secondary | ICD-10-CM | POA: Diagnosis not present

## 2014-11-11 DIAGNOSIS — R070 Pain in throat: Secondary | ICD-10-CM | POA: Diagnosis not present

## 2014-11-27 ENCOUNTER — Ambulatory Visit (INDEPENDENT_AMBULATORY_CARE_PROVIDER_SITE_OTHER): Payer: Medicare Other

## 2014-11-27 DIAGNOSIS — I4891 Unspecified atrial fibrillation: Secondary | ICD-10-CM

## 2014-11-27 DIAGNOSIS — Z5181 Encounter for therapeutic drug level monitoring: Secondary | ICD-10-CM

## 2014-11-27 DIAGNOSIS — I480 Paroxysmal atrial fibrillation: Secondary | ICD-10-CM | POA: Diagnosis not present

## 2014-11-27 DIAGNOSIS — Z7901 Long term (current) use of anticoagulants: Secondary | ICD-10-CM

## 2014-11-27 LAB — POCT INR: INR: 2.3

## 2014-12-09 DIAGNOSIS — Z124 Encounter for screening for malignant neoplasm of cervix: Secondary | ICD-10-CM | POA: Diagnosis not present

## 2014-12-09 DIAGNOSIS — Z23 Encounter for immunization: Secondary | ICD-10-CM | POA: Diagnosis not present

## 2014-12-09 DIAGNOSIS — M8580 Other specified disorders of bone density and structure, unspecified site: Secondary | ICD-10-CM | POA: Diagnosis not present

## 2014-12-11 DIAGNOSIS — M7061 Trochanteric bursitis, right hip: Secondary | ICD-10-CM | POA: Diagnosis not present

## 2014-12-21 ENCOUNTER — Encounter: Payer: Self-pay | Admitting: Neurology

## 2014-12-21 ENCOUNTER — Ambulatory Visit (INDEPENDENT_AMBULATORY_CARE_PROVIDER_SITE_OTHER): Payer: Medicare Other | Admitting: Neurology

## 2014-12-21 VITALS — BP 122/62 | HR 58 | Ht 62.0 in | Wt 101.7 lb

## 2014-12-21 DIAGNOSIS — I2510 Atherosclerotic heart disease of native coronary artery without angina pectoris: Secondary | ICD-10-CM

## 2014-12-21 DIAGNOSIS — G3184 Mild cognitive impairment, so stated: Secondary | ICD-10-CM

## 2014-12-21 DIAGNOSIS — F419 Anxiety disorder, unspecified: Secondary | ICD-10-CM

## 2014-12-21 NOTE — Patient Instructions (Addendum)
Continue Aricept and Namenda Start routine aerobic exercise (walking) and resistance training (machines with light weights) Follow sleep hygiene tips.  Increase melatonin to 3 to 5 mg at bedtime Mediterranean diet Daily puzzles, games, crossword puzzles (set aside 30 minutes a day) Use GPS while driving Follow up in 6 months.

## 2014-12-21 NOTE — Progress Notes (Signed)
NEUROLOGY FOLLOW UP OFFICE NOTE  [name] [id number]  HISTORY OF PRESENT ILLNESS: [name] is a 81 year old right-handed woman with history of paroxysmal atrial fibrillation (on anticoagulation), hypertension, CAD, polymyalgia rheumatic, depression, IBS, and arthritis who follows up for Alzheimer's disease.  She is accompanied by her husband who provides some history.  UPDATE: She is taking Aricept 10mg  daily and Namenda 10mg  twice daily.  She is concerned about increased memory loss.  She is able to navigate while driving but cannot recall names of familiar streets or intersections.  She cannot remember where she placed certain utensils in the kitchen that she doesn't always use.  She forgets some familiar recipes.  She reports some vivid dreams from her childhood and has trouble falling asleep.    HISTORY: She is a retired Education officer, museum.  She began noticing gradual memory deficits over the past 3 years.  At first, she would forget appointments.  She would need to use a calendar to remember to keep phone appointments with friends that she had for 6-8 years.  She tends to look at the calendar 3 or 4 times a day to make sure she doesn't miss an appointment.  She began misplacing objects around the house.  She began forgetting recipes that she used for many years.  She has lived in [place] for over 40 years.  Sometimes when somebody talks to her about a specific location, she has to stop and try to remember where it is.  On rare occasions, she has gotten disoriented while driving familiar routes.  She cannot remember names of certain streets.  She forgets names of acquaintances and it would take a while before she remembers.  She enjoys reading, but often she will have to re-read chapters because she forgot the content.  She belongs to a book club and has become less vocal because she cannot remember the story as much anymore.  When she is in the kitchen, she sometimes forgets where she  keeps the pots or pans.  She has also begun forgetting to take her medication, so her husband has to remind her.  Her husband manages the finances, but she does write checks and pay some bills and has not had any difficulty with that.  When she wakes up in the morning, she is not sure of the day or what her plans are for that day.  She was given a diagnosis of early Alzheimer's recently, and has been anxious and scared since that time.  She easily cries but she says it is due to this news and not because of depression.  She denies hallucinations or delusions.  She usually sleeps well.  Both her mother and sister had dementia.    She also notes shakiness in her hands.  When she writes or holds a utensil, her hand shakes.    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  TSH was 2.850 and methylmalonic acid level was 145.  She has cerebrovascular disease with risk factors such as atrial fibrillation, hypertension, and hyperlipidemia.   PAST MEDICAL HISTORY: Past Medical History  Diagnosis Date  . Spinal stenosis   . Anxiety   . GERD (gastroesophageal reflux disease)   . Osteoporosis   . Paroxysmal atrial fibrillation (HCC)     Coumadin therapy  . Hypertension   . Hyperlipidemia   . CAD (coronary  artery disease) 2008/2010    Catheterization 2008, 40% LAD after first diagonal  /  nuclear January, 2010 normal  . Carotid artery disease ([place])     Doppler, July, 2011, 0-39% bilateral  . Endometrial polyp   . Atrophic vaginitis   . Vitamin D deficiency   . Stress   . Normal nuclear stress test Feb 2013    No ischemia. Normal wall motion. EF greater than 70%  . Osteopenia   . Palpitations     Monitor, May, 2013, no atrial fib,  one short run of PSVT  . Memory difficulty 01-20-13    "memory issues"  . Osteoarthritis   .  Polymyalgia rheumatica ([place])   . Irritable bowel syndrome with constipation   . Decreased platelet count ([place]) 04/2011    142,000    MEDICATIONS: Current Outpatient Prescriptions on File Prior to Visit  Medication Sig Dispense Refill  . Calcium-Magnesium-Vitamin D (CALCIUM 500 PO) Take 1 tablet by mouth daily.    [name] CARTIA XT 120 MG 24 hr capsule TAKE 1 CAPSULE BY MOUTH DAILY 90 capsule 1  . donepezil (ARICEPT) 10 MG tablet Take 10 mg by mouth at bedtime.    [name] lisinopril (PRINIVIL,ZESTRIL) 5 MG tablet Take 5 mg by mouth daily.     . memantine (NAMENDA) 10 MG tablet 2 tablets at bedtime 60 tablet 2  . metoprolol tartrate (LOPRESSOR) 25 MG tablet Take 25 mg by mouth 2 (two) times daily.    . Multiple Vitamins-Minerals (OCUVITE PRESERVISION) TABS Take 1 each by mouth daily.      [name] warfarin (COUMADIN) 5 MG tablet TAKE AS DIRECTED BY ANTICOAGULATION CLINIC 100 tablet 1  . [DISCONTINUED] estradiol (ESTRACE) 1 MG tablet Take 1 mg by mouth as needed.      No current facility-administered medications on file prior to visit.    ALLERGIES: No Known Allergies  FAMILY HISTORY: Family History  Problem Relation Age of Onset  . Transient ischemic attack Mother   . Stroke Mother   . Cancer Father     PROSTATE  . Pancreatitis Father   . Stroke Sister   . Spina bifida Brother   . Hypertension Brother   . Heart disease Brother   . Hypertension Sister   . Diabetes Sister   . Heart disease Sister   . Dementia Sister     SOCIAL HISTORY: Social History   Social History  . Marital Status: Married    Spouse Name: Jeneen Rinks  . Number of Children: 2  . Years of Education: Masters   Occupational History  . Retired    Social History Main Topics  . Smoking status: Never Smoker   . Smokeless tobacco: Never Used  . Alcohol Use: No  . Drug Use: No  . Sexual Activity: No   Other Topics Concern  . Not on file   Social History Narrative   Patient is married Jeneen Rinks) and lives at home with her  husband.   Patient has two children.   Retired; Married;   Patient is right-handed.   Patient drinks very little caffeine.   Patient has a Scientist, water quality.   Has had increased stress as her husband had a stroke ~ 3wks ago and she has been taking care of him. (04/07/11)    REVIEW OF SYSTEMS: Constitutional: No fevers, chills, or sweats, no generalized fatigue, change in appetite Eyes: No visual changes, double vision, eye pain Ear, nose and throat: No hearing loss, ear pain, nasal congestion, sore throat Cardiovascular:  No chest pain, palpitations Respiratory:  No shortness of breath at rest or with exertion, wheezes GastrointestinaI: No nausea, vomiting, diarrhea, abdominal pain, fecal incontinence Genitourinary:  No dysuria, urinary retention or frequency Musculoskeletal:  No neck pain, back pain Integumentary: No rash, pruritus, skin lesions Neurological: as above Psychiatric: depression, insomnia, anxiety Endocrine: No palpitations, fatigue, diaphoresis, mood swings, change in appetite, change in weight, increased thirst Hematologic/Lymphatic:  No anemia, purpura, petechiae. Allergic/Immunologic: no itchy/runny eyes, nasal congestion, recent allergic reactions, rashes  PHYSICAL EXAM: Filed Vitals:   12/21/14 0747  BP: 122/62  Pulse: 58   General: No acute distress.  Patient appears well-groomed.   Head:  Normocephalic/atraumatic Eyes:  Fundoscopic exam unremarkable without vessel changes, exudates, hemorrhages or papilledema. Neck: supple, no paraspinal tenderness, full range of motion Heart:  Regular rate and rhythm Lungs:  Clear to auscultation bilaterally Back: No paraspinal tenderness Neurological Exam: alert and oriented to person, place, and time. Attention span and concentration intact, delayed recall poor, remote memory intact, fund of knowledge intact.  Speech fluent and not dysarthric, language intact.   MMSE - Mini Mental State Exam 12/21/2014  Orientation to time  5  Orientation to Place 4  Registration 3  Attention/ Calculation 5  Recall 0  Language- name 2 objects 2  Language- repeat 1  Language- follow 3 step command 3  Language- read & follow direction 1  Write a sentence 1  Copy design 1  Total score 26   CN II-XII intact. Fundoscopic exam unremarkable without vessel changes, exudates, hemorrhages or papilledema.  Bulk and tone normal, muscle strength 5/5 throughout.  Sensation to light touch, temperature and vibration intact.  Deep tendon reflexes 2+ throughout.  Finger to nose and heel to shin testing intact.  Gait normal.  IMPRESSION: Mild cognitive impairment of the amnestic type Anxiety and depression  PLAN: Continue Aricept and Namenda Sleep hygiene Routine exercise Mediterranean diet Use of GPS while driving At this point, she would like to hold off on an antidepressant or counseling (such as cognitive behavioral therapy) Follow up in 6 months.  31 minutes spent face to face with patient, over 50% spent discussing diagnosis and management.  Metta Clines, DO  CC:  Lajean Manes, MD

## 2014-12-22 ENCOUNTER — Encounter: Payer: Self-pay | Admitting: Cardiovascular Disease

## 2014-12-22 ENCOUNTER — Ambulatory Visit (INDEPENDENT_AMBULATORY_CARE_PROVIDER_SITE_OTHER): Payer: Medicare Other | Admitting: Cardiovascular Disease

## 2014-12-22 VITALS — BP 124/58 | HR 48 | Ht 62.0 in | Wt 100.4 lb

## 2014-12-22 DIAGNOSIS — I2510 Atherosclerotic heart disease of native coronary artery without angina pectoris: Secondary | ICD-10-CM | POA: Diagnosis not present

## 2014-12-22 DIAGNOSIS — I779 Disorder of arteries and arterioles, unspecified: Secondary | ICD-10-CM | POA: Diagnosis not present

## 2014-12-22 DIAGNOSIS — I739 Peripheral vascular disease, unspecified: Secondary | ICD-10-CM

## 2014-12-22 DIAGNOSIS — I10 Essential (primary) hypertension: Secondary | ICD-10-CM

## 2014-12-22 DIAGNOSIS — I480 Paroxysmal atrial fibrillation: Secondary | ICD-10-CM | POA: Diagnosis not present

## 2014-12-22 NOTE — Patient Instructions (Signed)
Medication Instructions:  STOP Cardizem  Labwork: Your physician recommends that you return for lab work (cholesterol, liver, basic metabolic panel) in: 6 months on the day of or a few days before your office visit with Dr. Acie Fredrickson.  You will need to FAST for this appointment - nothing to eat or drink after midnight the night before except water.    Testing/Procedures: None Ordered    Follow-Up: Your physician wants you to follow-up in: 6 months with Dr. Acie Fredrickson.  You will receive a reminder letter in the mail two months in advance. If you don't receive a letter, please call our office to schedule the follow-up appointment.

## 2014-12-22 NOTE — Progress Notes (Signed)
Cardiology Office Note   Date:  12/22/2014   ID:  [name], DOB [birth year], MRN [id number]  PCP:  Mathews Argyle, MD  Cardiologist:   Thayer Headings, MD ( transitioning from Dr. Ron Parker)   Chief Complaint  Patient presents with  . Follow-up   Problem list 1. Mild coronary artery disease 2. Mild carotid artery disease 3. Paroxysmal atrial fibrillation-on Coumadin 4. Essential hypertension 5. Hyperlipidemia   History of Present Illness: [name] is a 81 y.o. female who presents to establish care. She is a previous patient of Dr. Ron Parker. She has a history of mild coronary artery disease, carotid artery disease. She admits to having some memory problems. She has paroxysmal atrial fibrillation and is on Coumadin.     HR is slow,  she denies having any syncope or presyncope. She does all of her typical household chores without any difficulty . She exercises several times a week.    Past Medical History  Diagnosis Date  . Spinal stenosis   . Anxiety   . GERD (gastroesophageal reflux disease)   . Osteoporosis   . Paroxysmal atrial fibrillation (HCC)     Coumadin therapy  . Hypertension   . Hyperlipidemia   . CAD (coronary artery disease) 2008/2010    Catheterization 2008, 40% LAD after first diagonal  /  nuclear January, 2010 normal  . Carotid artery disease ([place])     Doppler, July, 2011, 0-39% bilateral  . Endometrial polyp   . Atrophic vaginitis   . Vitamin D deficiency   . Stress   . Normal nuclear stress test Feb 2013    No ischemia. Normal wall motion. EF greater than 70%  . Osteopenia   . Palpitations     Monitor, May, 2013, no atrial fib,  one short run of PSVT  . Memory difficulty 01-20-13    "memory issues"  . Osteoarthritis   . Polymyalgia rheumatica ([place])   . Irritable bowel syndrome with constipation   . Decreased platelet count ([place]) 04/2011    142,000    Past Surgical History  Procedure Laterality Date  . Laminectomy  March  2009    L4 through L5  . Cataract surgery  2009  . Tonsillectomy    . Hysteroscopy  02/07/2005    HYST W D&C, DX: PMB W SMALL ENDO POLYP-SURGEON [name]  . Breast surgery      BENIGN BREAST LUMP  . Excision morton's neuroma  2000  . Hip surgery Right 2005    debridement  . Dilation and curettage of uterus    . Colonoscopy with propofol N/A 02/11/2013    Procedure: COLONOSCOPY WITH PROPOFOL;  Surgeon: [name], MD;  Location: [facility];  Service: Endoscopy;  Laterality: N/A;     Current Outpatient Prescriptions  Medication Sig Dispense Refill  . Calcium-Magnesium-Vitamin D (CALCIUM 500 PO) Take 1 tablet by mouth daily.    [name] CARTIA XT 120 MG 24 hr capsule TAKE 1 CAPSULE BY MOUTH DAILY 90 capsule 1  . cyanocobalamin 100 MCG tablet Take 100 mcg by mouth daily.    [name] donepezil (ARICEPT) 10 MG tablet Take 10 mg by mouth at bedtime.    [name] lisinopril (PRINIVIL,ZESTRIL) 5 MG tablet Take 5 mg by mouth daily.     . Melatonin 1 MG TABS Take 1 tablet by mouth at bedtime.     . memantine (NAMENDA) 10 MG tablet Take 10 mg by mouth 2 (two) times daily.    . metoprolol  tartrate (LOPRESSOR) 25 MG tablet Take 25 mg by mouth 2 (two) times daily.    . Multiple Vitamins-Minerals (OCUVITE PRESERVISION) TABS Take 1 each by mouth daily.      . VOLTAREN 1 % GEL Apply 1 application topically as needed (for muscle pain).     [name] warfarin (COUMADIN) 5 MG tablet TAKE AS DIRECTED BY ANTICOAGULATION CLINIC 100 tablet 1  . [DISCONTINUED] estradiol (ESTRACE) 1 MG tablet Take 1 mg by mouth as needed.      No current facility-administered medications for this visit.    Allergies:   Review of patient's allergies indicates no known allergies.    Social History:  The patient  reports that she has never smoked. She has never used smokeless tobacco. She reports that she does not drink alcohol or use illicit drugs.   Family History:  The patient's family history includes Cancer in her father; Dementia in her  sister; Diabetes in her sister; Heart disease in her brother and sister; Hypertension in her brother and sister; Pancreatitis in her father; Spina bifida in her brother; Stroke in her mother and sister; Transient ischemic attack in her mother.    ROS:  Please see the history of present illness.    Review of Systems: Constitutional:  denies fever, chills, diaphoresis, appetite change and fatigue.  HEENT: denies photophobia, eye pain, redness, hearing loss, ear pain, congestion, sore throat, rhinorrhea, sneezing, neck pain, neck stiffness and tinnitus.  Respiratory: denies SOB, DOE, cough, chest tightness, and wheezing.  Cardiovascular: denies chest pain, palpitations and leg swelling.  Gastrointestinal: denies nausea, vomiting, abdominal pain, diarrhea, constipation, blood in stool.  Genitourinary: denies dysuria, urgency, frequency, hematuria, flank pain and difficulty urinating.  Musculoskeletal: denies  myalgias, back pain, joint swelling, arthralgias and gait problem.   Skin: denies pallor, rash and wound.  Neurological: denies dizziness, seizures, syncope, weakness, light-headedness, numbness and headaches.   Hematological: denies adenopathy, easy bruising, personal or family bleeding history.  Psychiatric/ Behavioral: denies suicidal ideation, mood changes, confusion, nervousness, sleep disturbance and agitation.       All other systems are reviewed and negative.    PHYSICAL EXAM: VS:  BP 124/58 mmHg  Pulse 48  Ht 5\' 2"  (1.575 m)  Wt 45.541 kg (100 lb 6.4 oz)  BMI 18.36 kg/m2  SpO2 99% , BMI Body mass index is 18.36 kg/(m^2). GEN: Well nourished, well developed, in no acute distress HEENT: normal Neck: no JVD, carotid bruits, or masses Cardiac: RRR; bradycardia .  no murmurs, rubs, or gallops,no edema  Respiratory:  clear to auscultation bilaterally, normal work of breathing GI: soft, nontender, nondistended, + BS MS: no deformity or atrophy Skin: warm and dry, no  rash Neuro:  Strength and sensation are intact Psych: normal   EKG:  EKG is not ordered today.   Recent Labs: 06/29/2014: BUN 11; Creatinine, Ser 0.76; Hemoglobin 12.3; Platelets 136*; Potassium 3.7; Sodium 139    Lipid Panel    Component Value Date/Time   CHOL 200 01/22/2012 0826   TRIG 58.0 01/22/2012 0826   HDL 57.60 01/22/2012 0826   CHOLHDL 3 01/22/2012 0826   VLDL 11.6 01/22/2012 0826   LDLCALC 131* 01/22/2012 0826   LDLDIRECT 119.7 08/26/2009 1007      Wt Readings from Last 3 Encounters:  12/22/14 45.541 kg (100 lb 6.4 oz)  12/21/14 46.131 kg (101 lb 11.2 oz)  10/15/14 45.813 kg (101 lb)      Other studies Reviewed: Additional studies/ records that were reviewed today include: .  Review of the above records demonstrates:    ASSESSMENT AND PLAN:  1. Mild coronary artery disease: Nautia is doing well. She has not had any episodes of chest pain 2. Mild carotid artery disease: She's not had any signs or symptoms of stroke 3. Paroxysmal atrial fibrillation: Her heart rate is very regular but she is bradycardic. We'll discontinue the diltiazem. We will continue with the Coumadin. I'll see her again in 6 months for follow-up visit 4. Essential hypertension: Her blood pressures is well-controlled. Continue lisinopril and metoprolol   Current medicines are reviewed at length with the patient today.  The patient does not have concerns regarding medicines.  The following changes have been made:  no change  Labs/ tests ordered today include:  No orders of the defined types were placed in this encounter.     Disposition:   FU with me in 6 months      Dorothy Polhemus, Wonda Cheng, MD  12/22/2014 9:04 AM    [facility] [place], [place], [place]  [address] Phone: [phone]; Fax: [phone]   [facility]  [address] [place], [place]  [address] [phone]   Fax [phone]

## 2014-12-24 DIAGNOSIS — J300 Vasomotor rhinitis: Secondary | ICD-10-CM | POA: Diagnosis not present

## 2014-12-24 DIAGNOSIS — R490 Dysphonia: Secondary | ICD-10-CM | POA: Diagnosis not present

## 2014-12-24 DIAGNOSIS — K219 Gastro-esophageal reflux disease without esophagitis: Secondary | ICD-10-CM | POA: Diagnosis not present

## 2014-12-24 DIAGNOSIS — R070 Pain in throat: Secondary | ICD-10-CM | POA: Diagnosis not present

## 2015-01-08 ENCOUNTER — Ambulatory Visit (INDEPENDENT_AMBULATORY_CARE_PROVIDER_SITE_OTHER): Payer: Medicare Other | Admitting: *Deleted

## 2015-01-08 ENCOUNTER — Other Ambulatory Visit: Payer: Self-pay | Admitting: Cardiology

## 2015-01-08 DIAGNOSIS — I4891 Unspecified atrial fibrillation: Secondary | ICD-10-CM | POA: Diagnosis not present

## 2015-01-08 DIAGNOSIS — I480 Paroxysmal atrial fibrillation: Secondary | ICD-10-CM

## 2015-01-08 DIAGNOSIS — Z5181 Encounter for therapeutic drug level monitoring: Secondary | ICD-10-CM | POA: Diagnosis not present

## 2015-01-08 DIAGNOSIS — Z7901 Long term (current) use of anticoagulants: Secondary | ICD-10-CM

## 2015-01-08 LAB — POCT INR: INR: 1.6

## 2015-01-23 ENCOUNTER — Other Ambulatory Visit: Payer: Self-pay | Admitting: Neurology

## 2015-01-25 NOTE — Telephone Encounter (Signed)
Last OV: 12/21/14 Next OV: 06/21/15

## 2015-01-28 DIAGNOSIS — K219 Gastro-esophageal reflux disease without esophagitis: Secondary | ICD-10-CM | POA: Diagnosis not present

## 2015-01-28 DIAGNOSIS — R252 Cramp and spasm: Secondary | ICD-10-CM | POA: Diagnosis not present

## 2015-01-28 DIAGNOSIS — I10 Essential (primary) hypertension: Secondary | ICD-10-CM | POA: Diagnosis not present

## 2015-01-28 DIAGNOSIS — R202 Paresthesia of skin: Secondary | ICD-10-CM | POA: Diagnosis not present

## 2015-02-01 ENCOUNTER — Ambulatory Visit (INDEPENDENT_AMBULATORY_CARE_PROVIDER_SITE_OTHER): Payer: Medicare Other | Admitting: *Deleted

## 2015-02-01 DIAGNOSIS — I480 Paroxysmal atrial fibrillation: Secondary | ICD-10-CM | POA: Diagnosis not present

## 2015-02-01 DIAGNOSIS — I4891 Unspecified atrial fibrillation: Secondary | ICD-10-CM

## 2015-02-01 DIAGNOSIS — Z7901 Long term (current) use of anticoagulants: Secondary | ICD-10-CM | POA: Diagnosis not present

## 2015-02-01 DIAGNOSIS — Z5181 Encounter for therapeutic drug level monitoring: Secondary | ICD-10-CM | POA: Diagnosis not present

## 2015-02-01 LAB — POCT INR: INR: 2.2

## 2015-02-09 DIAGNOSIS — M2041 Other hammer toe(s) (acquired), right foot: Secondary | ICD-10-CM | POA: Diagnosis not present

## 2015-02-09 DIAGNOSIS — M7742 Metatarsalgia, left foot: Secondary | ICD-10-CM | POA: Diagnosis not present

## 2015-02-25 DIAGNOSIS — R070 Pain in throat: Secondary | ICD-10-CM | POA: Diagnosis not present

## 2015-02-25 DIAGNOSIS — R490 Dysphonia: Secondary | ICD-10-CM | POA: Diagnosis not present

## 2015-03-04 ENCOUNTER — Ambulatory Visit (INDEPENDENT_AMBULATORY_CARE_PROVIDER_SITE_OTHER): Payer: Medicare Other | Admitting: *Deleted

## 2015-03-04 DIAGNOSIS — I480 Paroxysmal atrial fibrillation: Secondary | ICD-10-CM

## 2015-03-04 DIAGNOSIS — I4891 Unspecified atrial fibrillation: Secondary | ICD-10-CM | POA: Diagnosis not present

## 2015-03-04 DIAGNOSIS — Z5181 Encounter for therapeutic drug level monitoring: Secondary | ICD-10-CM | POA: Diagnosis not present

## 2015-03-04 DIAGNOSIS — Z7901 Long term (current) use of anticoagulants: Secondary | ICD-10-CM

## 2015-03-04 LAB — POCT INR: INR: 2.4

## 2015-03-26 ENCOUNTER — Ambulatory Visit (INDEPENDENT_AMBULATORY_CARE_PROVIDER_SITE_OTHER): Payer: Medicare Other | Admitting: Physician Assistant

## 2015-03-26 VITALS — BP 164/70 | HR 56 | Temp 97.3°F | Resp 16 | Ht 62.0 in | Wt 101.8 lb

## 2015-03-26 DIAGNOSIS — IMO0001 Reserved for inherently not codable concepts without codable children: Secondary | ICD-10-CM

## 2015-03-26 DIAGNOSIS — R030 Elevated blood-pressure reading, without diagnosis of hypertension: Secondary | ICD-10-CM | POA: Diagnosis not present

## 2015-03-26 DIAGNOSIS — R21 Rash and other nonspecific skin eruption: Secondary | ICD-10-CM

## 2015-03-26 NOTE — Patient Instructions (Signed)
If you begin to have chest pain, shortness of breath, nausea, sweatiness or HA that seems abnormal, then please call 911.  Please monitor your BP and see your PCP next week. I will forward today's encounter to him.

## 2015-03-26 NOTE — Progress Notes (Addendum)
o  04/19/2015 7:09 PM   DOB: 08-14-1933 / MRN: [id number]  SUBJECTIVE:  [name] is a 82 y.o. female presenting for a serpiginous rash on the bottom of her right foot that has been present for 1 month. She is worried that it may be shingles.  She some mild pain with walking.  States the area is tender.  She is taking coumadin for atrial fibrillation and her INR has been running around 2-2-5.  She feels well today otherwise and has no complaints.    She has a history of HTN. Reports her pressure typically runs roughly [id number] systolic.  She denies chest pain, SOB, new DOE, diaphoresis, and nausea today.  She attributes the elevated BP to anxiousness regarding the rash.     There is no immunization history on file for this patient.   She has No Known Allergies.   She  has a past medical history of Spinal stenosis; Anxiety; GERD (gastroesophageal reflux disease); Osteoporosis; Paroxysmal atrial fibrillation ([place]); Hypertension; Hyperlipidemia; CAD (coronary artery disease) (2008/2010); Carotid artery disease ([place]); Endometrial polyp; Atrophic vaginitis; Vitamin D deficiency; Stress; Normal nuclear stress test (Feb 2013); Osteopenia; Palpitations; Memory difficulty (01-20-13); Osteoarthritis; Polymyalgia rheumatica ([place]); Irritable bowel syndrome with constipation; and Decreased platelet count ([place]) (04/2011).    She  reports that she has never smoked. She has never used smokeless tobacco. She reports that she does not drink alcohol or use illicit drugs. She  reports that she does not engage in sexual activity. The patient  has past surgical history that includes Laminectomy (March 2009); CATARACT SURGERY (2009); Tonsillectomy; Hysteroscopy (02/07/2005); Breast surgery; Excision Morton's neuroma (2000); Hip surgery (Right, 2005); Dilation and curettage of uterus; and Colonoscopy with propofol (N/A, 02/11/2013).  Her family history includes Cancer in her father; Dementia in her sister; Diabetes in  her sister; Heart disease in her brother and sister; Hypertension in her brother and sister; Pancreatitis in her father; Spina bifida in her brother; Stroke in her mother and sister; Transient ischemic attack in her mother.  Review of Systems  Constitutional: Negative for fever and chills.  Eyes: Negative for blurred vision.  Respiratory: Negative for cough and shortness of breath.   Cardiovascular: Negative for chest pain.  Gastrointestinal: Negative for nausea and abdominal pain.  Genitourinary: Negative for dysuria, urgency and frequency.  Musculoskeletal: Negative for myalgias.  Skin: Positive for rash. Negative for itching.  Neurological: Negative for dizziness, tingling and headaches.  Psychiatric/Behavioral: Negative for depression. The patient is not nervous/anxious.     Problem list and medications reviewed and updated by myself where necessary, and exist elsewhere in the encounter.   OBJECTIVE:  BP 164/70 mmHg  Pulse 56  Temp(Src) 97.3 F (36.3 C) (Oral)  Resp 16  Ht 5\' 2"  (1.575 m)  Wt 101 lb 12.8 oz (46.176 kg)  BMI 18.61 kg/m2  SpO2 98%  Physical Exam  Constitutional: She is oriented to person, place, and time. She appears well-developed and well-nourished.  Eyes: EOM are normal. Pupils are equal, round, and reactive to light.  Cardiovascular: Normal heart sounds.  A regularly irregular rhythm present. Exam reveals no S4 and no decreased pulses.   No murmur heard.  No systolic murmur is present   No diastolic murmur is present  Pulmonary/Chest: Effort normal.  Abdominal: She exhibits no distension.  Musculoskeletal: Normal range of motion.  Neurological: She is alert and oriented to person, place, and time. No cranial nerve deficit.  Skin: Skin is warm and dry. She is not  diaphoretic.  Psychiatric: She has a normal mood and affect.  Vitals reviewed.   No results found for this or any previous visit (from the past 48 hour(s)).  ASSESSMENT AND PLAN  Romy  was seen today for rash.  Diagnoses and all orders for this visit:  Elevated BP: Her pressure decreased to 170 over 70 while resting quietly.  She is asymptomatic at this time. She has a history of HTN and takes low dose lisinopril for this  RTC and ED precautions provided via AVS. Advised she return to see her PCP early next week.    Rash and nonspecific skin eruption: Will screen for viruses. Will send to dermatology.     The patient was advised to call or return to clinic if she does not see an improvement in symptoms or to seek the care of the closest emergency department if she worsens with the above plan.   Philis Fendt, MHS, PA-C [facility] 04/19/2015 7:09 PM

## 2015-04-01 ENCOUNTER — Ambulatory Visit (INDEPENDENT_AMBULATORY_CARE_PROVIDER_SITE_OTHER): Payer: Medicare Other | Admitting: *Deleted

## 2015-04-01 DIAGNOSIS — I480 Paroxysmal atrial fibrillation: Secondary | ICD-10-CM

## 2015-04-01 DIAGNOSIS — Z5181 Encounter for therapeutic drug level monitoring: Secondary | ICD-10-CM | POA: Diagnosis not present

## 2015-04-01 DIAGNOSIS — I4891 Unspecified atrial fibrillation: Secondary | ICD-10-CM | POA: Diagnosis not present

## 2015-04-01 DIAGNOSIS — Z7901 Long term (current) use of anticoagulants: Secondary | ICD-10-CM

## 2015-04-01 LAB — REFLEX ADENOVIRUS CULTURE

## 2015-04-01 LAB — RFX HSV/VARICELLA ZOSTER RAPID CULT

## 2015-04-01 LAB — POCT INR: INR: 1.6

## 2015-04-02 ENCOUNTER — Encounter: Payer: Self-pay | Admitting: Podiatry

## 2015-04-02 ENCOUNTER — Ambulatory Visit (INDEPENDENT_AMBULATORY_CARE_PROVIDER_SITE_OTHER): Payer: Medicare Other | Admitting: Podiatry

## 2015-04-02 ENCOUNTER — Ambulatory Visit (INDEPENDENT_AMBULATORY_CARE_PROVIDER_SITE_OTHER): Payer: Medicare Other

## 2015-04-02 VITALS — BP 141/67 | HR 86 | Resp 12

## 2015-04-02 DIAGNOSIS — L309 Dermatitis, unspecified: Secondary | ICD-10-CM

## 2015-04-02 DIAGNOSIS — M79671 Pain in right foot: Secondary | ICD-10-CM

## 2015-04-02 DIAGNOSIS — L600 Ingrowing nail: Secondary | ICD-10-CM

## 2015-04-02 NOTE — Progress Notes (Signed)
Subjective:     Patient ID: [name], female   DOB: 13-Aug-1933, 82 y.o.   MRN: [id number]  HPI patient states I have pain in my right arch and that on my left big toenail have a painful ingrown toenail on the medial side that's making it hard for me to wear shoe gear comfortably   Review of Systems  All other systems reviewed and are negative.      Objective:   Physical Exam  Constitutional: She is oriented to person, place, and time.  Cardiovascular: Intact distal pulses.   Musculoskeletal: Normal range of motion.  Neurological: She is oriented to person, place, and time.  Skin: Skin is warm.  Nursing note and vitals reviewed.  neurovascular status intact muscle strength adequate range of motion within normal limits with patient noted to have a discoloration in the right arch and is found to have on the left hallux and ingrown toenail medial border that's painful when pressed and makes shoe gear difficult. Patient's found have good digital perfusion and is well oriented 3     Assessment:     Probable dermatological dermatitis right arch and ingrown toenail with pain left hallux medial border    Plan:     H&P conditions reviewed with patient and recommended cortisone cream for the right arch and discussed nail correction left which she wants done and explained surgery and risk. She wants procedure and today I infiltrated the left big toe 60 mg I can Marcaine mixture remove the medial border exposed matrix and applied phenol 3 applications 30 seconds followed by alcohol lavage and sterile dressing. Gave instructions on soaks and reappoint

## 2015-04-02 NOTE — Patient Instructions (Signed)

## 2015-04-02 NOTE — Progress Notes (Signed)
   Subjective:    Patient ID: [name], female    DOB: 1934/02/07, 82 y.o.   MRN: [id number]  HPI   Rt foot arch is been painful/rash 4 years. Foot is getting worse especially when walking. Tried met. Pad to put inside of the shoes-but no relief.  Also, check lt foot great toenail is sore.   Review of Systems  Skin: Positive for color change and rash.       Objective:   Physical Exam        Assessment & Plan:

## 2015-04-04 LAB — VIRAL CULTURE VIRC

## 2015-04-04 LAB — CYTOMEGALOVIRUS CULTURE

## 2015-04-19 ENCOUNTER — Telehealth: Payer: Self-pay

## 2015-04-19 NOTE — Telephone Encounter (Signed)
[name], Can you please change the ICD-10 code on the referral please to match what the patient needs to see the Dermatologist for? It states elevated BP. Thank you

## 2015-04-19 NOTE — Telephone Encounter (Signed)
Done. Philis Fendt, MS, PA-C 7:09 PM, 04/19/2015

## 2015-04-22 ENCOUNTER — Ambulatory Visit (INDEPENDENT_AMBULATORY_CARE_PROVIDER_SITE_OTHER): Payer: Medicare Other

## 2015-04-22 DIAGNOSIS — Z7901 Long term (current) use of anticoagulants: Secondary | ICD-10-CM

## 2015-04-22 DIAGNOSIS — I4891 Unspecified atrial fibrillation: Secondary | ICD-10-CM

## 2015-04-22 DIAGNOSIS — I480 Paroxysmal atrial fibrillation: Secondary | ICD-10-CM

## 2015-04-22 DIAGNOSIS — Z5181 Encounter for therapeutic drug level monitoring: Secondary | ICD-10-CM

## 2015-04-22 LAB — POCT INR: INR: 1.5

## 2015-04-23 ENCOUNTER — Other Ambulatory Visit: Payer: Self-pay | Admitting: Cardiovascular Disease

## 2015-05-06 ENCOUNTER — Ambulatory Visit (INDEPENDENT_AMBULATORY_CARE_PROVIDER_SITE_OTHER): Payer: Medicare Other | Admitting: *Deleted

## 2015-05-06 DIAGNOSIS — I4891 Unspecified atrial fibrillation: Secondary | ICD-10-CM

## 2015-05-06 DIAGNOSIS — Z5181 Encounter for therapeutic drug level monitoring: Secondary | ICD-10-CM | POA: Diagnosis not present

## 2015-05-06 DIAGNOSIS — Z7901 Long term (current) use of anticoagulants: Secondary | ICD-10-CM | POA: Diagnosis not present

## 2015-05-06 DIAGNOSIS — I480 Paroxysmal atrial fibrillation: Secondary | ICD-10-CM

## 2015-05-06 LAB — POCT INR: INR: 2

## 2015-05-27 ENCOUNTER — Ambulatory Visit (INDEPENDENT_AMBULATORY_CARE_PROVIDER_SITE_OTHER): Payer: Medicare Other | Admitting: *Deleted

## 2015-05-27 DIAGNOSIS — I480 Paroxysmal atrial fibrillation: Secondary | ICD-10-CM | POA: Diagnosis not present

## 2015-05-27 DIAGNOSIS — Z7901 Long term (current) use of anticoagulants: Secondary | ICD-10-CM | POA: Diagnosis not present

## 2015-05-27 DIAGNOSIS — Z5181 Encounter for therapeutic drug level monitoring: Secondary | ICD-10-CM | POA: Diagnosis not present

## 2015-05-27 DIAGNOSIS — I4891 Unspecified atrial fibrillation: Secondary | ICD-10-CM

## 2015-05-27 LAB — POCT INR: INR: 1.7

## 2015-06-04 DIAGNOSIS — J101 Influenza due to other identified influenza virus with other respiratory manifestations: Secondary | ICD-10-CM | POA: Diagnosis not present

## 2015-06-04 DIAGNOSIS — R509 Fever, unspecified: Secondary | ICD-10-CM | POA: Diagnosis not present

## 2015-06-05 ENCOUNTER — Encounter (HOSPITAL_COMMUNITY): Payer: Self-pay | Admitting: Emergency Medicine

## 2015-06-05 ENCOUNTER — Emergency Department (HOSPITAL_COMMUNITY)
Admission: EM | Admit: 2015-06-05 | Discharge: 2015-06-05 | Disposition: A | Discharge status: Home / Self Care | Payer: Medicare Other | Attending: Emergency Medicine | Admitting: Emergency Medicine

## 2015-06-05 ENCOUNTER — Emergency Department (HOSPITAL_COMMUNITY): Payer: Medicare Other

## 2015-06-05 DIAGNOSIS — Z8659 Personal history of other mental and behavioral disorders: Secondary | ICD-10-CM | POA: Insufficient documentation

## 2015-06-05 DIAGNOSIS — I2510 Atherosclerotic heart disease of native coronary artery without angina pectoris: Secondary | ICD-10-CM | POA: Diagnosis not present

## 2015-06-05 DIAGNOSIS — M8580 Other specified disorders of bone density and structure, unspecified site: Secondary | ICD-10-CM | POA: Diagnosis not present

## 2015-06-05 DIAGNOSIS — Z8719 Personal history of other diseases of the digestive system: Secondary | ICD-10-CM | POA: Insufficient documentation

## 2015-06-05 DIAGNOSIS — Z8742 Personal history of other diseases of the female genital tract: Secondary | ICD-10-CM | POA: Insufficient documentation

## 2015-06-05 DIAGNOSIS — Z862 Personal history of diseases of the blood and blood-forming organs and certain disorders involving the immune mechanism: Secondary | ICD-10-CM | POA: Insufficient documentation

## 2015-06-05 DIAGNOSIS — M810 Age-related osteoporosis without current pathological fracture: Secondary | ICD-10-CM | POA: Insufficient documentation

## 2015-06-05 DIAGNOSIS — J111 Influenza due to unidentified influenza virus with other respiratory manifestations: Secondary | ICD-10-CM | POA: Diagnosis not present

## 2015-06-05 DIAGNOSIS — Z7901 Long term (current) use of anticoagulants: Secondary | ICD-10-CM | POA: Diagnosis not present

## 2015-06-05 DIAGNOSIS — E559 Vitamin D deficiency, unspecified: Secondary | ICD-10-CM | POA: Insufficient documentation

## 2015-06-05 DIAGNOSIS — I10 Essential (primary) hypertension: Secondary | ICD-10-CM | POA: Insufficient documentation

## 2015-06-05 DIAGNOSIS — R531 Weakness: Secondary | ICD-10-CM | POA: Diagnosis present

## 2015-06-05 DIAGNOSIS — I480 Paroxysmal atrial fibrillation: Secondary | ICD-10-CM | POA: Diagnosis not present

## 2015-06-05 DIAGNOSIS — R05 Cough: Secondary | ICD-10-CM | POA: Diagnosis not present

## 2015-06-05 DIAGNOSIS — R4701 Aphasia: Secondary | ICD-10-CM | POA: Diagnosis not present

## 2015-06-05 DIAGNOSIS — Z79899 Other long term (current) drug therapy: Secondary | ICD-10-CM | POA: Diagnosis not present

## 2015-06-05 DIAGNOSIS — R5383 Other fatigue: Secondary | ICD-10-CM | POA: Diagnosis not present

## 2015-06-05 LAB — URINALYSIS, ROUTINE W REFLEX MICROSCOPIC
BILIRUBIN URINE: NEGATIVE
GLUCOSE, UA: NEGATIVE mg/dL
KETONES UR: 15 mg/dL — AB
Nitrite: NEGATIVE
PROTEIN: NEGATIVE mg/dL
Specific Gravity, Urine: 1.021 (ref 1.005–1.030)
pH: 7 (ref 5.0–8.0)

## 2015-06-05 LAB — CBC
HEMATOCRIT: 40.7 % (ref 36.0–46.0)
Hemoglobin: 13.4 g/dL (ref 12.0–15.0)
MCH: 31.3 pg (ref 26.0–34.0)
MCHC: 32.9 g/dL (ref 30.0–36.0)
MCV: 95.1 fL (ref 78.0–100.0)
PLATELETS: 130 10*3/uL — AB (ref 150–400)
RBC: 4.28 MIL/uL (ref 3.87–5.11)
RDW: 12.7 % (ref 11.5–15.5)
WBC: 3.9 10*3/uL — ABNORMAL LOW (ref 4.0–10.5)

## 2015-06-05 LAB — BASIC METABOLIC PANEL
Anion gap: 9 (ref 5–15)
BUN: 9 mg/dL (ref 6–20)
CO2: 27 mmol/L (ref 22–32)
Calcium: 8.9 mg/dL (ref 8.9–10.3)
Chloride: 103 mmol/L (ref 101–111)
Creatinine, Ser: 0.8 mg/dL (ref 0.44–1.00)
GFR calc Af Amer: >60 mL/min (ref >60)
GLUCOSE: 158 mg/dL — AB (ref 65–99)
POTASSIUM: 3.9 mmol/L (ref 3.5–5.1)
Sodium: 139 mmol/L (ref 135–145)

## 2015-06-05 LAB — URINE MICROSCOPIC-ADD ON: Bacteria, UA: NONE SEEN

## 2015-06-05 LAB — PROTIME-INR
INR: 1.36 (ref 0.00–1.49)
PROTHROMBIN TIME: 16.9 s — AB (ref 11.6–15.2)

## 2015-06-05 MED ORDER — SODIUM CHLORIDE 0.9 % IV BOLUS (SEPSIS)
1000.0000 mL | Freq: Once | INTRAVENOUS | Status: AC
  Administered 2015-06-05: 1000 mL via INTRAVENOUS

## 2015-06-05 NOTE — ED Provider Notes (Signed)
CSN: [id number]     Arrival date & time 06/05/15  [id number] History   First MD Initiated Contact with Patient 06/05/15 [phone]     Chief Complaint  Patient presents with  . Weakness     (Consider location/radiation/quality/duration/timing/severity/associated sxs/prior Treatment) HPI Comments: Patient presents with a near syncopal type episode. She states over the last 2-3 days she's had runny nose coughing and congestion with fatigue and body aches. She went to her PCP yesterday and had a positive flu swab. She states this morning she was in the kitchen and started feeling weak all over. She felt like she might pass out. She remembers her husband talking but couldn't speak. Her husband helped her to the living room where she sat down in a chair and started feeling better. This lasted about 20-30 seconds. She denies any chest pain other than soreness from coughing. No shortness of breath. No numbness or weakness to her extremities. She felt a little lightheaded but no dizziness. She never fell during this episode. She denies any head trauma. She denies any headache.  Patient is a 82 y.o. female presenting with weakness.  Weakness Pertinent negatives include no chest pain, no abdominal pain, no headaches and no shortness of breath.    Past Medical History  Diagnosis Date  . Spinal stenosis   . Anxiety   . GERD (gastroesophageal reflux disease)   . Osteoporosis   . Paroxysmal atrial fibrillation (HCC)     Coumadin therapy  . Hypertension   . Hyperlipidemia   . CAD (coronary artery disease) 2008/2010    Catheterization 2008, 40% LAD after first diagonal  /  nuclear January, 2010 normal  . Carotid artery disease ([place])     Doppler, July, 2011, 0-39% bilateral  . Endometrial polyp   . Atrophic vaginitis   . Vitamin D deficiency   . Stress   . Normal nuclear stress test Feb 2013    No ischemia. Normal wall motion. EF greater than 70%  . Osteopenia   . Palpitations     Monitor, May, 2013, no  atrial fib,  one short run of PSVT  . Memory difficulty 01-20-13    "memory issues"  . Osteoarthritis   . Polymyalgia rheumatica ([place])   . Irritable bowel syndrome with constipation   . Decreased platelet count ([place]) 04/2011    142,000   Past Surgical History  Procedure Laterality Date  . Laminectomy  March 2009    L4 through L5  . Cataract surgery  2009  . Tonsillectomy    . Hysteroscopy  02/07/2005    HYST W D&C, DX: PMB W SMALL ENDO POLYP-SURGEON [name]  . Breast surgery      BENIGN BREAST LUMP  . Excision morton's neuroma  2000  . Hip surgery Right 2005    debridement  . Dilation and curettage of uterus    . Colonoscopy with propofol N/A 02/11/2013    Procedure: COLONOSCOPY WITH PROPOFOL;  Surgeon: [name], MD;  Location: [facility];  Service: Endoscopy;  Laterality: N/A;   Family History  Problem Relation Age of Onset  . Transient ischemic attack Mother   . Stroke Mother   . Cancer Father     PROSTATE  . Pancreatitis Father   . Stroke Sister   . Spina bifida Brother   . Hypertension Brother   . Heart disease Brother   . Hypertension Sister   . Diabetes Sister   . Heart disease Sister   . Dementia Sister  Social History  Substance Use Topics  . Smoking status: Never Smoker   . Smokeless tobacco: Never Used  . Alcohol Use: No   OB History    Gravida Para Term Preterm AB TAB SAB Ectopic Multiple Living   4 2 2  2     2      Review of Systems  Constitutional: Positive for fatigue. Negative for fever, chills and diaphoresis.  HENT: Positive for congestion, rhinorrhea and sore throat. Negative for sneezing.   Eyes: Negative.   Respiratory: Negative for cough, chest tightness and shortness of breath.   Cardiovascular: Negative for chest pain and leg swelling.  Gastrointestinal: Negative for nausea, vomiting, abdominal pain, diarrhea and blood in stool.  Genitourinary: Negative for frequency, hematuria, flank pain and difficulty urinating.   Musculoskeletal: Positive for myalgias. Negative for back pain and arthralgias.  Skin: Negative for rash.  Neurological: Positive for weakness. Negative for dizziness, speech difficulty, numbness and headaches.      Allergies  Review of patient's allergies indicates no known allergies.  Home Medications   Prior to Admission medications   Medication Sig Start Date End Date Taking? Authorizing Provider  Calcium-Magnesium-Vitamin D (CALCIUM 500 PO) Take 1 tablet by mouth daily. Reported on 03/26/2015   Yes Historical Provider, MD  cyanocobalamin 100 MCG tablet Take 100 mcg by mouth daily. Reported on 03/26/2015   Yes Historical Provider, MD  donepezil (ARICEPT) 10 MG tablet Take 10 mg by mouth at bedtime.   Yes Historical Provider, MD  lisinopril (PRINIVIL,ZESTRIL) 5 MG tablet Take 5 mg by mouth daily.  09/12/13  Yes Historical Provider, MD  Melatonin 1 MG TABS Take 1 tablet by mouth at bedtime.    Yes Historical Provider, MD  memantine (NAMENDA) 10 MG tablet TAKE 2 TABLETS BY MOUTH AT BEDTIME 01/25/15  Yes Pieter Partridge, DO  metoprolol tartrate (LOPRESSOR) 25 MG tablet Take 25 mg by mouth 2 (two) times daily.   Yes Historical Provider, MD  Multiple Vitamins-Minerals (OCUVITE PRESERVISION) TABS Take 1 each by mouth daily.     Yes Historical Provider, MD  warfarin (COUMADIN) 5 MG tablet Take 5-7.5 mg by mouth as directed. Take 1 tablet all days of the week except Tuesday take 1.5 tablets   Yes Historical Provider, MD  ipratropium (ATROVENT) 0.06 % nasal spray U 2 SPRAYS IEN BID 01/30/15   Historical Provider, MD  VOLTAREN 1 % GEL Apply 1 application topically as needed (for muscle pain).  12/14/14   Historical Provider, MD  warfarin (COUMADIN) 5 MG tablet TAKE TABLETS BY MOUTH AS DIRECTED BY ANTICOAGULATION CLINIC. Patient not taking: Reported on 06/05/2015 04/23/15   Carlena Bjornstad, MD   BP 146/77 mmHg  Pulse 59  Temp(Src) 98.3 F (36.8 C) (Oral)  Resp 16  SpO2 100% Physical Exam   Constitutional: She is oriented to person, place, and time. She appears well-developed and well-nourished.  HENT:  Head: Normocephalic and atraumatic.  Mouth/Throat: Oropharynx is clear and moist.  Eyes: Pupils are equal, round, and reactive to light.  Neck: Normal range of motion. Neck supple.  Cardiovascular: Normal rate, regular rhythm and normal heart sounds.   Pulmonary/Chest: Effort normal and breath sounds normal. No respiratory distress. She has no wheezes. She has no rales. She exhibits no tenderness.  Abdominal: Soft. Bowel sounds are normal. There is no tenderness. There is no rebound and no guarding.  Musculoskeletal: Normal range of motion. She exhibits no edema.  Lymphadenopathy:    She has no cervical adenopathy.  Neurological: She is alert and oriented to person, place, and time. She has normal strength. No cranial nerve deficit or sensory deficit. GCS eye subscore is 4. GCS verbal subscore is 5. GCS motor subscore is 6.  Finger-nose intact, no pronator drift  Skin: Skin is warm and dry. No rash noted.  Psychiatric: She has a normal mood and affect.    ED Course  Procedures (including critical care time) Labs Review Labs Reviewed  BASIC METABOLIC PANEL - Abnormal; Notable for the following:    Glucose, Bld 158 (*)    All other components within normal limits  CBC - Abnormal; Notable for the following:    WBC 3.9 (*)    Platelets 130 (*)    All other components within normal limits  URINALYSIS, ROUTINE W REFLEX MICROSCOPIC (NOT AT [facility]) - Abnormal; Notable for the following:    APPearance CLOUDY (*)    Hgb urine dipstick TRACE (*)    Ketones, ur 15 (*)    Leukocytes, UA MODERATE (*)    All other components within normal limits  PROTIME-INR - Abnormal; Notable for the following:    Prothrombin Time 16.9 (*)    All other components within normal limits  URINE MICROSCOPIC-ADD ON - Abnormal; Notable for the following:    Squamous Epithelial / LPF 0-5 (*)    All  other components within normal limits    Imaging Review Dg Chest 2 View  06/05/2015  CLINICAL DATA:  Dry cough and congestion x 2-3 days; pt was diagnosed with the flu yesterday; Non-smoker EXAM: CHEST  2 VIEW COMPARISON:  04/07/2011 FINDINGS: Normal mediastinum and cardiac silhouette. Normal pulmonary vasculature. No evidence of effusion, infiltrate, or pneumothorax. No acute bony abnormality. IMPRESSION: No acute cardiopulmonary findings. Electronically Signed   By: Suzy Bouchard M.D.   On: 06/05/2015 09:58   I have personally reviewed and evaluated these images and lab results as part of my medical decision-making.   EKG Interpretation   Date/Time:  Saturday June 05 2015 08:38:44 EDT Ventricular Rate:  60 PR Interval:  144 QRS Duration: 98 QT Interval:  400 QTC Calculation: 400 R Axis:   73 Text Interpretation:  Sinus rhythm Anterior infarct, old Borderline ST  depression, diffuse leads since last tracing no significant change  Confirmed by Wilmary Levit  MD, Aquilla Voiles ([id number]) on 06/05/2015 11:33:50 AM      MDM   Final diagnoses:  Influenza    Patient presents with a near syncopal episode. I Silva Bandy is related to her recent flu diagnosis. She was given IV fluids in the ED and is feeling much better. She is ambulated without symptoms. Her labs are unremarkable. Her chest x-rays negative for pneumonia. She didn't have symptoms that would be more consistent with acute coronary syndrome or stroke. There is no other suggestions of infection. Her urine was cloudy with some white cells but no bacteria. I will send it for culture. She was discharged home in good condition. She has already been started on Tamiflu. She was advised to return here if she has any worsening symptoms.    Malvin Johns, MD 06/05/15 1135

## 2015-06-05 NOTE — Discharge Instructions (Signed)

## 2015-06-05 NOTE — ED Notes (Signed)
[place] at the bedside

## 2015-06-05 NOTE — ED Notes (Signed)
Per [facility] at 0730 this morning husband reports 20 second episode while talking to the patient when she stopped responding.  Patient states she remembers him talking to her but she was unable to speak.  Patient is alert and oriented and in no apparent distress at this time.

## 2015-06-05 NOTE — ED Notes (Signed)
Patient transported to X-ray 

## 2015-06-07 LAB — URINE CULTURE

## 2015-06-08 DIAGNOSIS — J101 Influenza due to other identified influenza virus with other respiratory manifestations: Secondary | ICD-10-CM | POA: Diagnosis not present

## 2015-06-11 ENCOUNTER — Ambulatory Visit (INDEPENDENT_AMBULATORY_CARE_PROVIDER_SITE_OTHER): Payer: Medicare Other

## 2015-06-11 DIAGNOSIS — Z7901 Long term (current) use of anticoagulants: Secondary | ICD-10-CM

## 2015-06-11 DIAGNOSIS — I4891 Unspecified atrial fibrillation: Secondary | ICD-10-CM

## 2015-06-11 DIAGNOSIS — I480 Paroxysmal atrial fibrillation: Secondary | ICD-10-CM

## 2015-06-11 DIAGNOSIS — Z5181 Encounter for therapeutic drug level monitoring: Secondary | ICD-10-CM

## 2015-06-11 LAB — POCT INR: INR: 1.7

## 2015-06-21 ENCOUNTER — Ambulatory Visit: Payer: Medicare Other | Admitting: Neurology

## 2015-06-22 ENCOUNTER — Ambulatory Visit (INDEPENDENT_AMBULATORY_CARE_PROVIDER_SITE_OTHER): Payer: Medicare Other | Admitting: Cardiovascular Disease

## 2015-06-22 ENCOUNTER — Encounter: Payer: Self-pay | Admitting: Cardiovascular Disease

## 2015-06-22 VITALS — BP 130/60 | HR 53 | Wt 110.1 lb

## 2015-06-22 DIAGNOSIS — I10 Essential (primary) hypertension: Secondary | ICD-10-CM | POA: Diagnosis not present

## 2015-06-22 DIAGNOSIS — E785 Hyperlipidemia, unspecified: Secondary | ICD-10-CM

## 2015-06-22 DIAGNOSIS — I739 Peripheral vascular disease, unspecified: Secondary | ICD-10-CM

## 2015-06-22 DIAGNOSIS — I2510 Atherosclerotic heart disease of native coronary artery without angina pectoris: Secondary | ICD-10-CM

## 2015-06-22 DIAGNOSIS — I480 Paroxysmal atrial fibrillation: Secondary | ICD-10-CM

## 2015-06-22 DIAGNOSIS — I779 Disorder of arteries and arterioles, unspecified: Secondary | ICD-10-CM | POA: Diagnosis not present

## 2015-06-22 LAB — LIPID PANEL
CHOL/HDL RATIO: 3.2 ratio (ref <=5.0)
Cholesterol: 201 mg/dL — ABNORMAL HIGH (ref 125–200)
HDL: 63 mg/dL (ref >=46)
LDL CALC: 125 mg/dL (ref <130)
TRIGLYCERIDES: 66 mg/dL (ref <150)
VLDL: 13 mg/dL (ref <30)

## 2015-06-22 NOTE — Addendum Note (Signed)
Addended by: Velna Ochs on: 06/22/2015 08:24 AM   Modules accepted: Orders

## 2015-06-22 NOTE — Patient Instructions (Signed)
Medication Instructions:  Your physician recommends that you continue on your current medications as directed. Please refer to the Current Medication list given to you today.   Labwork: Lipid today  Testing/Procedures: None ordered  Follow-Up: Your physician wants you to follow-up in: 1 year with Dr.Nahser You will receive a reminder letter in the mail two months in advance. If you don't receive a letter, please call our office to schedule the follow-up appointment.   Any Other Special Instructions Will Be Listed Below (If Applicable).     If you need a refill on your cardiac medications before your next appointment, please call your pharmacy.

## 2015-06-22 NOTE — Progress Notes (Signed)
Cardiology Office Note   Date:  06/22/2015   ID:  [name], DOB Oct 27, 1933, MRN [id number]  PCP:  Mathews Argyle, MD  Cardiologist:   Thayer Headings, MD ( transitioning from Dr. Ron Parker)   No chief complaint on file.  Problem list 1. Mild coronary artery disease 2. Mild carotid artery disease 3. Paroxysmal atrial fibrillation-on Coumadin 4. Essential hypertension 5. Hyperlipidemia   History of Present Illness: [name] is a 82 y.o. female who presents to establish care. She is a previous patient of Dr. Ron Parker. She has a history of mild coronary artery disease, carotid artery disease. She admits to having some memory problems. She has paroxysmal atrial fibrillation and is on Coumadin.     HR is slow,  she denies having any syncope or presyncope. She does all of her typical household chores without any difficulty . She exercises several times a week.  June 22, 2015:  [name] is doing well.  Told me that she has some dementia.    Past Medical History  Diagnosis Date  . Spinal stenosis   . Anxiety   . GERD (gastroesophageal reflux disease)   . Osteoporosis   . Paroxysmal atrial fibrillation (HCC)     Coumadin therapy  . Hypertension   . Hyperlipidemia   . CAD (coronary artery disease) 2008/2010    Catheterization 2008, 40% LAD after first diagonal  /  nuclear January, 2010 normal  . Carotid artery disease ([place])     Doppler, July, 2011, 0-39% bilateral  . Endometrial polyp   . Atrophic vaginitis   . Vitamin D deficiency   . Stress   . Normal nuclear stress test Feb 2013    No ischemia. Normal wall motion. EF greater than 70%  . Osteopenia   . Palpitations     Monitor, May, 2013, no atrial fib,  one short run of PSVT  . Memory difficulty 01-20-13    "memory issues"  . Osteoarthritis   . Polymyalgia rheumatica ([place])   . Irritable bowel syndrome with constipation   . Decreased platelet count ([place]) 04/2011    142,000    Past Surgical History    Procedure Laterality Date  . Laminectomy  March 2009    L4 through L5  . Cataract surgery  2009  . Tonsillectomy    . Hysteroscopy  02/07/2005    HYST W D&C, DX: PMB W SMALL ENDO POLYP-SURGEON [name]  . Breast surgery      BENIGN BREAST LUMP  . Excision morton's neuroma  2000  . Hip surgery Right 2005    debridement  . Dilation and curettage of uterus    . Colonoscopy with propofol N/A 02/11/2013    Procedure: COLONOSCOPY WITH PROPOFOL;  Surgeon: [name], MD;  Location: [facility];  Service: Endoscopy;  Laterality: N/A;     Current Outpatient Prescriptions  Medication Sig Dispense Refill  . donepezil (ARICEPT) 10 MG tablet Take 10 mg by mouth at bedtime.    [name] lisinopril (PRINIVIL,ZESTRIL) 5 MG tablet Take 5 mg by mouth daily.     . Melatonin 1 MG TABS Take 1 tablet by mouth at bedtime.     . memantine (NAMENDA) 10 MG tablet TAKE 2 TABLETS BY MOUTH AT BEDTIME 60 tablet 4  . metoprolol tartrate (LOPRESSOR) 25 MG tablet Take 25 mg by mouth 2 (two) times daily.    [name] warfarin (COUMADIN) 5 MG tablet TAKE TABLETS BY MOUTH AS DIRECTED BY ANTICOAGULATION CLINIC. 100 tablet 0  .  warfarin (COUMADIN) 5 MG tablet Take 5-7.5 mg by mouth as directed. Take 1 tablet all days of the week except Tuesday take 1.5 tablets    . [DISCONTINUED] estradiol (ESTRACE) 1 MG tablet Take 1 mg by mouth as needed.      No current facility-administered medications for this visit.    Allergies:   Review of patient's allergies indicates no known allergies.    Social History:  The patient  reports that she has never smoked. She has never used smokeless tobacco. She reports that she does not drink alcohol or use illicit drugs.   Family History:  The patient's family history includes Cancer in her father; Dementia in her sister; Diabetes in her sister; Heart disease in her brother and sister; Hypertension in her brother and sister; Pancreatitis in her father; Spina bifida in her brother; Stroke in her  mother and sister; Transient ischemic attack in her mother.    ROS:  Please see the history of present illness.    Review of Systems: Constitutional:  denies fever, chills, diaphoresis, appetite change and fatigue.  HEENT: denies photophobia, eye pain, redness, hearing loss, ear pain, congestion, sore throat, rhinorrhea, sneezing, neck pain, neck stiffness and tinnitus.  Respiratory: denies SOB, DOE, cough, chest tightness, and wheezing.  Cardiovascular: denies chest pain, palpitations and leg swelling.  Gastrointestinal: denies nausea, vomiting, abdominal pain, diarrhea, constipation, blood in stool.  Genitourinary: denies dysuria, urgency, frequency, hematuria, flank pain and difficulty urinating.  Musculoskeletal: denies  myalgias, back pain, joint swelling, arthralgias and gait problem.   Skin: denies pallor, rash and wound.  Neurological: denies dizziness, seizures, syncope, weakness, light-headedness, numbness and headaches.   Hematological: denies adenopathy, easy bruising, personal or family bleeding history.  Psychiatric/ Behavioral: denies suicidal ideation, mood changes, confusion, nervousness, sleep disturbance and agitation.       All other systems are reviewed and negative.    PHYSICAL EXAM: VS:  BP 130/60 mmHg  Pulse 53  Wt 110 lb 1.9 oz (49.95 kg)  SpO2 97% , BMI Body mass index is 20.14 kg/(m^2). GEN: Well nourished, well developed, in no acute distress HEENT: normal Neck: no JVD, carotid bruits, or masses Cardiac: RRR;   Occasional PACs,   bradycardia .  no murmurs, rubs, or gallops,no edema  Respiratory:  clear to auscultation bilaterally, normal work of breathing GI: soft, nontender, nondistended, + BS MS: no deformity or atrophy Skin: warm and dry, no rash Neuro:  Strength and sensation are intact Psych: normal   EKG:  EKG is not ordered today. ECG June 05, 2015:   NSR at 60.  Poor R wave progression..    No ST or T wave changes.    Recent  Labs: 06/05/2015: BUN 9; Creatinine, Ser 0.80; Hemoglobin 13.4; Platelets 130*; Potassium 3.9; Sodium 139    Lipid Panel    Component Value Date/Time   CHOL 200 01/22/2012 0826   TRIG 58.0 01/22/2012 0826   HDL 57.60 01/22/2012 0826   CHOLHDL 3 01/22/2012 0826   VLDL 11.6 01/22/2012 0826   LDLCALC 131* 01/22/2012 0826   LDLDIRECT 119.7 08/26/2009 1007      Wt Readings from Last 3 Encounters:  06/22/15 110 lb 1.9 oz (49.95 kg)  03/26/15 101 lb 12.8 oz (46.176 kg)  12/22/14 100 lb 6.4 oz (45.541 kg)      Other studies Reviewed: Additional studies/ records that were reviewed today include: . Review of the above records demonstrates:    ASSESSMENT AND PLAN:  1. Mild coronary artery  disease: Zareyah is doing well. She has not had any episodes of chest pain 2. Mild carotid artery disease: She's not had any signs or symptoms of stroke 3. Paroxysmal atrial fibrillation: Her heart rate is very regular but she is bradycardic.  . We will continue with the Coumadin. I'll see her again in 1 year for follow-up visit 4. Essential hypertension: Her blood pressures is well-controlled. Continue lisinopril and metoprolol   Current medicines are reviewed at length with the patient today.  The patient does not have concerns regarding medicines.  The following changes have been made:  no change  Labs/ tests ordered today include:  No orders of the defined types were placed in this encounter.     Disposition:   FU with me in 1 year     Deara Bober, Wonda Cheng, MD  06/22/2015 8:09 AM    [facility] [place], [place], [place]  [address] Phone: [phone]; Fax: [phone]   [facility]  [address] [place], [place]  [address] [phone]   Fax [phone]

## 2015-06-24 ENCOUNTER — Encounter: Payer: Self-pay | Admitting: Neurology

## 2015-06-24 ENCOUNTER — Ambulatory Visit (INDEPENDENT_AMBULATORY_CARE_PROVIDER_SITE_OTHER): Payer: Medicare Other | Admitting: Neurology

## 2015-06-24 VITALS — Ht 62.0 in | Wt 102.0 lb

## 2015-06-24 DIAGNOSIS — I2510 Atherosclerotic heart disease of native coronary artery without angina pectoris: Secondary | ICD-10-CM | POA: Diagnosis not present

## 2015-06-24 DIAGNOSIS — G309 Alzheimer's disease, unspecified: Secondary | ICD-10-CM

## 2015-06-24 DIAGNOSIS — F0280 Dementia in other diseases classified elsewhere without behavioral disturbance: Secondary | ICD-10-CM | POA: Diagnosis not present

## 2015-06-24 MED ORDER — MEMANTINE HCL ER 28 MG PO CP24: 28.0000 mg | ORAL_CAPSULE | Freq: Every day | ORAL | Status: DC

## 2015-06-24 NOTE — Patient Instructions (Addendum)
1.  Continue Aricept 10mg  at bedtime.  Change Namenda 10mg  to Namenda XR 28mg  at bedtime 2.  Follow sleep hygiene sheet. 3.  Continue social activities, Yoga, puzzles 4.  Use GPS when you drive.  Limit driving to daylight hours 5.  Mediterranean diet    Why follow it? Research shows. . Those who follow the Mediterranean diet have a reduced risk of heart disease  . The diet is associated with a reduced incidence of Parkinson's and Alzheimer's diseases . People following the diet may have longer life expectancies and lower rates of chronic diseases  . The Dietary Guidelines for Americans recommends the Mediterranean diet as an eating plan to promote health and prevent disease  What Is the Mediterranean Diet?  . Healthy eating plan based on typical foods and recipes of Mediterranean-style cooking . The diet is primarily a plant based diet; these foods should make up a majority of meals   Starches - Plant based foods should make up a majority of meals - They are an important sources of vitamins, minerals, energy, antioxidants, and fiber - Choose whole grains, foods high in fiber and minimally processed items  - Typical grain sources include wheat, oats, barley, corn, brown rice, bulgar, farro, millet, polenta, couscous  - Various types of beans include chickpeas, lentils, fava beans, black beans, white beans   Fruits  Veggies - Large quantities of antioxidant rich fruits & veggies; 6 or more servings  - Vegetables can be eaten raw or lightly drizzled with oil and cooked  - Vegetables common to the traditional Mediterranean Diet include: artichokes, arugula, beets, broccoli, brussel sprouts, cabbage, carrots, celery, collard greens, cucumbers, eggplant, kale, leeks, lemons, lettuce, mushrooms, okra, onions, peas, peppers, potatoes, pumpkin, radishes, rutabaga, shallots, spinach, sweet potatoes, turnips, zucchini - Fruits common to the Mediterranean Diet include: apples, apricots, avocados,  cherries, clementines, dates, figs, grapefruits, grapes, melons, nectarines, oranges, peaches, pears, pomegranates, strawberries, tangerines  Fats - Replace butter and margarine with healthy oils, such as olive oil, canola oil, and tahini  - Limit nuts to no more than a handful a day  - Nuts include walnuts, almonds, pecans, pistachios, pine nuts  - Limit or avoid candied, honey roasted or heavily salted nuts - Olives are central to the [employer] - can be eaten whole or used in a variety of dishes   Meats Protein - Limiting red meat: no more than a few times a month - When eating red meat: choose lean cuts and keep the portion to the size of deck of cards - Eggs: approx. 0 to 4 times a week  - Fish and lean poultry: at least 2 a week  - Healthy protein sources include, chicken, [place], lean beef, lamb - Increase intake of seafood such as tuna, salmon, trout, mackerel, shrimp, scallops - Avoid or limit high fat processed meats such as sausage and bacon  Dairy - Include moderate amounts of low fat dairy products  - Focus on healthy dairy such as fat free yogurt, skim milk, low or reduced fat cheese - Limit dairy products higher in fat such as whole or 2% milk, cheese, ice cream  Alcohol - Moderate amounts of red wine is ok  - No more than 5 oz daily for women (all ages) and men older than age 4  - No more than 10 oz of wine daily for men younger than 3  Other - Limit sweets and other desserts  - Use herbs and spices instead of salt to  flavor foods  - Herbs and spices common to the traditional Mediterranean Diet include: basil, bay leaves, chives, cloves, cumin, fennel, garlic, lavender, marjoram, mint, oregano, parsley, pepper, rosemary, sage, savory, sumac, tarragon, thyme   It's not just a diet, it's a lifestyle:  . The Mediterranean diet includes lifestyle factors typical of those in the region  . Foods, drinks and meals are best eaten with others and savored . Daily physical  activity is important for overall good health . This could be strenuous exercise like running and aerobics . This could also be more leisurely activities such as walking, housework, yard-work, or taking the stairs . Moderation is the key; a balanced and healthy diet accommodates most foods and drinks . Consider portion sizes and frequency of consumption of certain foods   Meal Ideas & Options:  . Breakfast:  o Whole wheat toast or whole wheat English muffins with peanut butter & hard boiled egg o Steel cut oats topped with apples & cinnamon and skim milk  o Fresh fruit: banana, strawberries, melon, berries, peaches  o Smoothies: strawberries, bananas, greek yogurt, peanut butter o Low fat greek yogurt with blueberries and granola  o Egg white omelet with spinach and mushrooms o Breakfast couscous: whole wheat couscous, apricots, skim milk, cranberries  . Sandwiches:  o Hummus and grilled vegetables (peppers, zucchini, squash) on whole wheat bread   o Grilled chicken on whole wheat pita with lettuce, tomatoes, cucumbers or tzatziki  o Tuna salad on whole wheat bread: tuna salad made with greek yogurt, olives, red peppers, capers, green onions o Garlic rosemary lamb pita: lamb sauted with garlic, rosemary, salt & pepper; add lettuce, cucumber, greek yogurt to pita - flavor with lemon juice and black pepper  . Seafood:  o Mediterranean grilled salmon, seasoned with garlic, basil, parsley, lemon juice and black pepper o Shrimp, lemon, and spinach whole-grain pasta salad made with low fat greek yogurt  o Seared scallops with lemon orzo  o Seared tuna steaks seasoned salt, pepper, coriander topped with tomato mixture of olives, tomatoes, olive oil, minced garlic, parsley, green onions and cappers  . Meats:  o Herbed greek chicken salad with kalamata olives, cucumber, feta  o Red bell peppers stuffed with spinach, bulgur, lean ground beef (or lentils) & topped with feta   o Kebabs: skewers of  chicken, tomatoes, onions, zucchini, squash  o [place] burgers: made with red onions, mint, dill, lemon juice, feta cheese topped with roasted red peppers . Vegetarian o Cucumber salad: cucumbers, artichoke hearts, celery, red onion, feta cheese, tossed in olive oil & lemon juice  o Hummus and whole grain pita points with a greek salad (lettuce, tomato, feta, olives, cucumbers, red onion) o Lentil soup with celery, carrots made with vegetable broth, garlic, salt and pepper  o Tabouli salad: parsley, bulgur, mint, scallions, cucumbers, tomato, radishes, lemon juice, olive oil, salt and pepper. 6.  Follow up in 6 months.

## 2015-06-24 NOTE — Progress Notes (Signed)
NEUROLOGY FOLLOW UP OFFICE NOTE  [name] [id number]  HISTORY OF PRESENT ILLNESS: [name] is an 82 year old right-handed woman with history of paroxysmal atrial fibrillation (on anticoagulation), hypertension, CAD, polymyalgia rheumatic, depression, IBS, and arthritis who follows up for Alzheimer's disease.  She is accompanied by her husband who provides some history.  UPDATE: She is taking Aricept 10mg  daily and Namenda 10mg  twice daily.  She feels that her memory is a little worse.  She more often has to think about what day it is.  She reads frequently but forgets the chapters the following day.  She remains very social and still participates with book club and meets friends for lunch.  She keeps busy at home with housework.  She may think twice a little more when driving on familiar routes, but overall she has no difficulty.  She limits driving to just familiar places.  Street names are an increased issue.  She feels depressed and anxious at times..  She does yoga twice a week.  Sleep is poor.  HISTORY: She is a retired Education officer, museum.  She began noticing gradual memory deficits over the past 4 years.  At first, she would forget appointments.  She would need to use a calendar to remember to keep phone appointments with friends that she had for 6-8 years.  She tended to look at the calendar 3 or 4 times a day to make sure she doesn't miss an appointment.  She began misplacing objects around the house.  She began forgetting recipes that she used for many years.  She has lived in [place] for over 87 years.  Sometimes when somebody talks to her about a specific location, she has to stop and try to remember where it is.  On rare occasions, she has gotten disoriented while driving familiar routes.  She cannot remember names of certain streets.  She forgets names of acquaintances and it would take a while before she remembers.  She enjoys reading, but often she will have to re-read  chapters because she forgot the content.  She belongs to a book club and has become less vocal because she cannot remember the story as much anymore.  When she is in the kitchen, she sometimes forgets where she keeps the pots or pans.  She has also begun forgetting to take her medication, so her husband has to remind her.  Her husband manages the finances, but she does write checks and pay some bills and has not had any difficulty with that.  When she wakes up in the morning, she is not sure of the day or what her plans are for that day.  She was given a diagnosis of early Alzheimer's recently, and has been anxious and scared since that time.  She easily cries but she says it is due to this news and not because of depression.  She denies hallucinations or delusions.  She usually sleeps well.  Both her mother and sister had dementia.    She also notes shakiness in her hands.  When she writes or holds a utensil, her hand shakes.    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  TSH was 2.850 and methylmalonic acid level was 145.  She has cerebrovascular disease with risk factors such as atrial fibrillation, hypertension, and hyperlipidemia.   PAST MEDICAL HISTORY: Past Medical History  Diagnosis Date  . Spinal stenosis   . Anxiety   . GERD (gastroesophageal reflux disease)   . Osteoporosis   . Paroxysmal atrial fibrillation (HCC)     Coumadin therapy  . Hypertension   . Hyperlipidemia   . CAD (coronary artery disease) 2008/2010    Catheterization 2008, 40% LAD after first diagonal  /  nuclear January, 2010 normal  . Carotid artery disease ([place])     Doppler, July, 2011, 0-39% bilateral  . Endometrial polyp   . Atrophic vaginitis   . Vitamin D deficiency   . Stress   . Normal nuclear stress test Feb 2013    No ischemia. Normal  wall motion. EF greater than 70%  . Osteopenia   . Palpitations     Monitor, May, 2013, no atrial fib,  one short run of PSVT  . Memory difficulty 01-20-13    "memory issues"  . Osteoarthritis   . Polymyalgia rheumatica ([place])   . Irritable bowel syndrome with constipation   . Decreased platelet count ([place]) 04/2011    142,000    MEDICATIONS: Current Outpatient Prescriptions on File Prior to Visit  Medication Sig Dispense Refill  . donepezil (ARICEPT) 10 MG tablet Take 10 mg by mouth at bedtime.    [name] lisinopril (PRINIVIL,ZESTRIL) 5 MG tablet Take 5 mg by mouth daily.     . Melatonin 1 MG TABS Take 1 tablet by mouth at bedtime.     . metoprolol tartrate (LOPRESSOR) 25 MG tablet Take 25 mg by mouth 2 (two) times daily.    [name] warfarin (COUMADIN) 5 MG tablet TAKE TABLETS BY MOUTH AS DIRECTED BY ANTICOAGULATION CLINIC. 100 tablet 0  . warfarin (COUMADIN) 5 MG tablet Take 5-7.5 mg by mouth as directed. Take 1 tablet all days of the week except Tuesday take 1.5 tablets    . [DISCONTINUED] estradiol (ESTRACE) 1 MG tablet Take 1 mg by mouth as needed.      No current facility-administered medications on file prior to visit.    ALLERGIES: No Known Allergies  FAMILY HISTORY: Family History  Problem Relation Age of Onset  . Transient ischemic attack Mother   . Stroke Mother   . Cancer Father     PROSTATE  . Pancreatitis Father   . Stroke Sister   . Spina bifida Brother   . Hypertension Brother   . Heart disease Brother   . Hypertension Sister   . Diabetes Sister   . Heart disease Sister   . Dementia Sister     SOCIAL HISTORY: Social History   Social History  . Marital Status: Married    Spouse Name: Jeneen Rinks  . Number of Children: 2  . Years of Education: Masters   Occupational History  . Retired    Social History Main Topics  . Smoking status: Never Smoker   . Smokeless tobacco: Never Used  . Alcohol Use: No  . Drug Use: No  . Sexual Activity: No   Other Topics  Concern  . Not on file   Social History Narrative   Patient is married Jeneen Rinks) and lives at home with her husband.   Patient has two children.   Retired; Married;   Patient is right-handed.   Patient drinks very little caffeine.   Patient has a Scientist, water quality.   Has had increased stress as her husband had a stroke ~ 3wks ago and she has been taking care of him. (04/07/11)    REVIEW OF SYSTEMS: Constitutional: No fevers, chills,  or sweats, no generalized fatigue, change in appetite Eyes: No visual changes, double vision, eye pain Ear, nose and throat: No hearing loss, ear pain, nasal congestion, sore throat Cardiovascular: No chest pain, palpitations Respiratory:  No shortness of breath at rest or with exertion, wheezes GastrointestinaI: No nausea, vomiting, diarrhea, abdominal pain, fecal incontinence Genitourinary:  No dysuria, urinary retention or frequency Musculoskeletal:  No neck pain, back pain Integumentary: No rash, pruritus, skin lesions Neurological: as above Psychiatric: No depression, insomnia, anxiety Endocrine: No palpitations, fatigue, diaphoresis, mood swings, change in appetite, change in weight, increased thirst Hematologic/Lymphatic:  No anemia, purpura, petechiae. Allergic/Immunologic: no itchy/runny eyes, nasal congestion, recent allergic reactions, rashes  PHYSICAL EXAM: There were no vitals filed for this visit. General: No acute distress.  Patient appears well-groomed.  normal body habitus. Head:  Normocephalic/atraumatic Eyes:  Fundoscopic exam unremarkable without vessel changes, exudates, hemorrhages or papilledema. Neck: supple, no paraspinal tenderness, full range of motion Heart:  Regular rate and rhythm Lungs:  Clear to auscultation bilaterally Back: No paraspinal tenderness Neurological Exam: alert and oriented to person, place, and time. Attention span and concentration intact, recent and remote memory intact, fund of knowledge intact.  Speech  fluent and not dysarthric, language intact.   Montreal Cognitive Assessment  06/24/2015 06/16/2014 06/16/2014 11/21/2013  Visuospatial/ Executive (0/5) 2 4 4 3   Naming (0/3) 3 2 2 3   Attention: Read list of digits (0/2) 2 2 2 2   Attention: Read list of letters (0/1) 1 1 1 1   Attention: Serial 7 subtraction starting at 100 (0/3) 1 3 3 3   Language: Repeat phrase (0/2) 2 2 2 2   Language : Fluency (0/1) 1 1 1 1   Abstraction (0/2) 1 2 2 1   Delayed Recall (0/5) 0 0 0 0  Orientation (0/6) 3 6 - 5  Total 16 23 - 21  Adjusted Score (based on education) 16 23 - 21   CN II-XII intact. Fundoscopic exam unremarkable without vessel changes, exudates, hemorrhages or papilledema.  Bulk and tone normal, muscle strength 5/5 throughout.  Sensation to light touch, temperature and vibration intact.  Deep tendon reflexes 2+ throughout, toes downgoing.  Finger to nose and heel to shin testing intact.  Gait normal  IMPRESSION: Alzheimer's disease  PLAN: 1.  Continue Aricept 10mg  at bedtime.  Change Namenda to XR 28mg  at bedtime. 2.  Limit driving to daylight hours and with GPS 3.  Continue socialization 4.  Sleep hygiene discussed 5.  Mediterranean diet 6.  Continue yoga 7.  Suggested small dose of antidepressant.  She would like to hold off for now. 8.  Follow up in 6 months.  26 minutes spent face to face with patient, over 50% spent discussing management.  Metta Clines, DO  CC:  Lajean Manes, MD

## 2015-06-24 NOTE — Progress Notes (Signed)
Chart forwarded.  

## 2015-06-25 ENCOUNTER — Ambulatory Visit (INDEPENDENT_AMBULATORY_CARE_PROVIDER_SITE_OTHER): Payer: Medicare Other

## 2015-06-25 DIAGNOSIS — I480 Paroxysmal atrial fibrillation: Secondary | ICD-10-CM

## 2015-06-25 DIAGNOSIS — Z7901 Long term (current) use of anticoagulants: Secondary | ICD-10-CM | POA: Diagnosis not present

## 2015-06-25 DIAGNOSIS — I4891 Unspecified atrial fibrillation: Secondary | ICD-10-CM | POA: Diagnosis not present

## 2015-06-25 DIAGNOSIS — Z5181 Encounter for therapeutic drug level monitoring: Secondary | ICD-10-CM

## 2015-06-25 LAB — POCT INR: INR: 2.9

## 2015-06-29 ENCOUNTER — Ambulatory Visit: Payer: Medicare Other | Admitting: Neurology

## 2015-07-04 DIAGNOSIS — B353 Tinea pedis: Secondary | ICD-10-CM | POA: Diagnosis not present

## 2015-07-04 DIAGNOSIS — J029 Acute pharyngitis, unspecified: Secondary | ICD-10-CM | POA: Diagnosis not present

## 2015-07-12 ENCOUNTER — Encounter: Payer: Self-pay | Admitting: Neurology

## 2015-07-12 ENCOUNTER — Ambulatory Visit (INDEPENDENT_AMBULATORY_CARE_PROVIDER_SITE_OTHER): Payer: Medicare Other | Admitting: Neurology

## 2015-07-12 VITALS — BP 100/62 | HR 86 | Resp 20 | Ht 62.0 in | Wt 100.0 lb

## 2015-07-12 DIAGNOSIS — F0280 Dementia in other diseases classified elsewhere without behavioral disturbance: Secondary | ICD-10-CM

## 2015-07-12 DIAGNOSIS — G309 Alzheimer's disease, unspecified: Secondary | ICD-10-CM

## 2015-07-12 DIAGNOSIS — I2510 Atherosclerotic heart disease of native coronary artery without angina pectoris: Secondary | ICD-10-CM

## 2015-07-12 MED ORDER — MEMANTINE HCL-DONEPEZIL HCL ER 28-10 MG PO CP24: 28.0000 mg | ORAL_CAPSULE | Freq: Every morning | ORAL | Status: DC

## 2015-07-12 NOTE — Progress Notes (Signed)
[facility]  Provider:  Larey Seat, M D  Referring Provider: Lajean Manes, MD Primary Care Physician:  Mathews Argyle, MD  Chief Complaint  Patient presents with  . Follow-up    memory, pt also sees Dr. Tomi Likens for memory, last saw Dr. Tomi Likens 06/24/2015 but does not want to see Dr. Tomi Likens any more, rm 63, with husband    HPI:  [name] is a 82 y.o. female , a retired Holiday representative , and seen here as a referral  from Dr. Felipa Eth for progressive memory loss, primarily anomia.   The [facility] are her to transfer her care from Dr Tomi Likens, based on her desire to see a female provider. She has been followed over several visits by Dr. 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 The patient has a history of paroxysmal atrial fibrillation and is on chronic anticoagulation, hypertension, coronary artery disease, polymyalgia rheumatica, depression and irritable bowel. She has meanwhile advanced from Aricept 5 mg to 10 mg daily and takes Namenda 10 mg twice daily she reports that she has more and more difficulties with short-term memory, is often very insecure about the date, about upcoming appointments, and still also confirms that she has trouble finding words sometimes midsentence. She doesn't hesitate on the phone with friends or  her adult children.  At church , she cannot longer name her co-parrisheners. She has also noticed that her sleep is now more fragmented she seems to frequently wake up between 2 and 3 AM which was not the case in 2015. She sometimes taking melatonin then after arousing from sleep. We will discuss this today as well I would also like to her [place] cognitive assessment scores. On 11/21/2013 21 points on 06/16/2014 23 points on April 13 this year 16 points. She continues to Yoga  twice weekly. She finds herself in a peculiar situation at the book club, as she forgets the books contents. She still plays piano.  We will also help to establish a bedtime routine, some comforting rituals.     MMSE - Mini Mental State Exam 07/12/2015 12/21/2014  Orientation to time 4 5  Orientation to Place 4 4  Registration 3 3  Attention/ Calculation 3 5  Recall 0 0  Language- name 2 objects 2 2  Language- repeat 1 1  Language- follow 3 step command 3 3  Language- read & follow direction 1 1  Write a sentence 1 1  Copy design 1 1  Total score 23 26      Last visit note;   [name] is a Caucasian, married, right-handed female seen today at the request of Dr. Felipa Eth. She reports that she noted for a period of about 2 years progressive loss of memory.   She describes is a typical for short-term memory loss for example she has trouble finding the name of certain rules or or of people she meets and is  acquainted with. Her husband just noticed over the last couple of months and that she sometimes exchanges the names of 2 neighbors ;Stanton Kidney and [place]. She has also developed some coping strategies; for example she will use a shopping list to make sure that she does not miss any items, and she also has somewhat refrained from conversations out of fear that she would not be able to contribute to  be as effective as she would like to be. [name] also as a member of a book club (where she feels unpressured and is normally one of the quieter members) that she has not felt a limitation to her ability to eloquently expressing her ideas thoughts and feelings in that comfortable environment.  She has not noted any difficulties in phone conversations.  She had trouble finding a measuring cup in her own kitchen. She wasn't sure what restaurant a friend talked about , even that she had been there. Once the friend told her it's location, she knew immediately. She feels troubled by not being able to  give directions to some areas of the city, she has lived in for 25 years. Her problem is that she cannot place the names of streets, but if given a visual help point, she would be able to drive.  No paraphrasia's or neologisms.  Reading books mostly in the evenings, she often has to repeat a chapter to connect to the story again. She is calling her problem " forgetfulness'.  She has mixed up times of appointments , forgot when a friend will call her, even that this has been a routine for many years , 8 AM on Wednesdays.  She  and her husband are empty nesters. Her husband suffered a stroke with residual aphasia 4 years ago.  Her friends tell her the same problems, mostly "things float up" , meaning they have a delay in word recall , too.   Review of Systems:  Out of a complete 14 system review, the patient complains of only the following symptoms, and all other reviewed systems are negative. Memory loss .She has sometimes  chronically 3-4 times a week. No pain problems, No falls, no balance difficulties. The patient has a very good posture, supple muscle tone, symmetric strength and normal reflexes.     Social History   Social History  . Marital Status: Married    Spouse Name: Jeneen Rinks  . Number of Children: 2  . Years of Education: Masters   Occupational History  . Retired    Social History Main Topics  . Smoking status: Never Smoker   . Smokeless tobacco: Never Used  . Alcohol Use: No  . Drug Use: No  . Sexual Activity: No   Other Topics Concern  . Not on file   Social History Narrative   Patient is married Jeneen Rinks) and lives at home with her husband.   Patient has two children.   Retired; Married;   Patient is right-handed.   Patient drinks very little caffeine.   Patient has a Scientist, water quality.   Has had increased stress as her husband had a stroke ~ 3wks ago and she has been taking care of him. (04/07/11)    Family History  Problem Relation Age of Onset  . Transient  ischemic attack Mother   . Stroke Mother   . Cancer Father     PROSTATE  . Pancreatitis Father   . Stroke Sister   . Spina bifida Brother   . Hypertension Brother   . Heart disease Brother   . Hypertension Sister   . Diabetes Sister   . Heart disease Sister   . Dementia Sister     Past Medical History  Diagnosis Date  . Spinal stenosis   . Anxiety   . GERD (gastroesophageal reflux disease)   . Osteoporosis   . Paroxysmal atrial fibrillation (HCC)     Coumadin therapy  . Hypertension   .  Hyperlipidemia   . CAD (coronary artery disease) 2008/2010    Catheterization 2008, 40% LAD after first diagonal  /  nuclear January, 2010 normal  . Carotid artery disease ([place])     Doppler, July, 2011, 0-39% bilateral  . Endometrial polyp   . Atrophic vaginitis   . Vitamin D deficiency   . Stress   . Normal nuclear stress test Feb 2013    No ischemia. Normal wall motion. EF greater than 70%  . Osteopenia   . Palpitations     Monitor, May, 2013, no atrial fib,  one short run of PSVT  . Memory difficulty 01-20-13    "memory issues"  . Osteoarthritis   . Polymyalgia rheumatica ([place])   . Irritable bowel syndrome with constipation   . Decreased platelet count ([place]) 04/2011    142,000    Past Surgical History  Procedure Laterality Date  . Laminectomy  March 2009    L4 through L5  . Cataract surgery  2009  . Tonsillectomy    . Hysteroscopy  02/07/2005    HYST W D&C, DX: PMB W SMALL ENDO POLYP-SURGEON [name]  . Breast surgery      BENIGN BREAST LUMP  . Excision morton's neuroma  2000  . Hip surgery Right 2005    debridement  . Dilation and curettage of uterus    . Colonoscopy with propofol N/A 02/11/2013    Procedure: COLONOSCOPY WITH PROPOFOL;  Surgeon: [name], MD;  Location: [facility];  Service: Endoscopy;  Laterality: N/A;    Current Outpatient Prescriptions  Medication Sig Dispense Refill  . donepezil (ARICEPT) 10 MG tablet Take 10 mg by mouth at bedtime.     [name] lisinopril (PRINIVIL,ZESTRIL) 5 MG tablet Take 5 mg by mouth daily.     . Melatonin 1 MG TABS Take 1 tablet by mouth at bedtime.     . memantine (NAMENDA XR) 28 MG CP24 24 hr capsule Take 1 capsule (28 mg total) by mouth daily. 30 capsule 5  . metoprolol tartrate (LOPRESSOR) 25 MG tablet Take 25 mg by mouth 2 (two) times daily.    [name] warfarin (COUMADIN) 5 MG tablet TAKE TABLETS BY MOUTH AS DIRECTED BY ANTICOAGULATION CLINIC. 100 tablet 0  . warfarin (COUMADIN) 5 MG tablet Take 5-7.5 mg by mouth as directed. Take 1 tablet all days of the week except Tuesday take 1.5 tablets    . [DISCONTINUED] estradiol (ESTRACE) 1 MG tablet Take 1 mg by mouth as needed.      No current facility-administered medications for this visit.    Allergies as of 07/12/2015  . (No Known Allergies)    Vitals: BP 100/62 mmHg  Pulse 86  Resp 20  Ht 5\' 2"  (1.575 m)  Wt 100 lb (45.36 kg)  BMI 18.29 kg/m2 Last Weight:  Wt Readings from Last 1 Encounters:  07/12/15 100 lb (45.36 kg)   Last Height:   Ht Readings from Last 1 Encounters:  07/12/15 5\' 2"  (1.575 m)    Physical exam:  General: The patient is awake, alert and appears not in acute distress. The patient is well groomed. Head: Normocephalic, atraumatic. Neck is supple. Mallampati 2, neck circumference: 14 inches  Cardiovascular:  irregular rate and rhythm- atrial fibrillation , patient reports flutter. no carotid bruit, and without distended neck veins. Respiratory: Lungs are clear to auscultation. Skin:  Without evidence of edema, or rash Trunk: normal posture.  Neurologic exam : The patient is awake and alert, oriented to place and  time.  Memory subjective  described as progressively impaired, MOCA here 19-30 (per Dr. Carlyle Lipa chart, her MMSE was 28-11 July 2011) . There is a normal attention span & concentration ability. Speech is fluent without  dysarthria, dysphonia or aphasia. Mood and affect are appropriate. She is quiet and demure,  anxious " inside ".   Cranial nerves: Pupils are equal and briskly reactive to light. Funduscopic exam without pallor or edema. Status post cataract.   Extraocular movements  in vertical and horizontal planes intact , with gaze to the left , her right eyelid closes , with prolonged upward gaze, ptosis is noted on the right.   Visual fields by finger perimetry are intact. Hearing to air-conduction impaired on the right, louder in bone conduction.   Facial sensation intact to fine touch.  Facial motor strength is symmetric and tongue and uvula move midline.  Motor exam: Normal tone , muscle bulk and symmetric normal strength in all extremities. Sensory:  Fine touch, pinprick and vibration were tested in all extremities. Coordination: Rapid alternating movements in the fingers/hands is tested and normal. Finger-to-nose maneuver  without evidence of ataxia, only mild dysmetria and no tremor. Gait and station: Patient walks without assistive device . Deep tendon reflexes: in the  upper and lower extremities are symmetric and intact. Babinski maneuver response is downgoing.   Assessment:  After physical and neurologic examination, review of laboratory studies, imaging, neurophysiology testing and pre-existing records, assessment is   [name] has developed a cognitive deficit by [facility] cognitive assessment test that would place her into the category of early dementia, given her MRI distribution of atrophy, this would be the Alzheimer's type. She was previously diagnosed with a amnestic mild cognitive impairment, which has a conversion rate to Alzheimer's dementia of 7% per year. Today's Mini-Mental Status Examination showed 23 out of 30 points with her greatest difficulties and the serial 7 category. Using the backward spelling as an alternative test the patient actually scored 4 points more 27 out of 30. She has never liked numbers and never was good in math.   I like for [name] to  establish a bedtime routine that does not include screen time. I would like for her to check her emails perhaps right after dinner but not before going to bed. She should take melatonin at a dose between 1 and 5 mg which of her comfortable at bedtime, this may help her to wake up later than the usual interruption between 2 and 3 AM. Melatonin can increase vivid dreams and if she feels that this is a concern I would have changed my recommendations. She states that the dreams are not nightmarish and not threatening or burdensome. I like for her to continue Aricept as well as Namenda. My goal would be to increase the Aricept to 23 mg daily if tolerated. She will also need a pill box maybe 1 with an alarm included so that she has reminder times. [place] is also available and once a day form and this may cut down on forgetting to take the medication,too. Continue Yoga, reading and piano, keep going out- be social.   I will try to enroll the patient in a clinical study. A Alzheimer's trial but has been large through [facility] unfortunately excludes patients with atrial fibrillation.  Rv with MOCA and MMSE ( world, not serial sevens) in 3 month.  Repeat MRI with and without gad, DWI.

## 2015-07-12 NOTE — Patient Instructions (Signed)
Alzheimer Disease Caregiver Guide Alzheimer disease is an illness that affects a person's brain. It causes a person to lose the ability to remember things and make good decisions. As the disease progresses, the person is unable to take care of himself or herself and needs more and more help to do simple tasks. Taking care of someone with Alzheimer disease can be very challenging and overwhelming.  MEMORY LOSS AND CONFUSION Memory loss and confusion is mild in the beginning stages of the disease. Both of these problems become more severe as the disease progresses. Eventually, the person will not recognize places or even close family members and friends.   Stay calm.  Respond with a short explanation. Long explanations can be overwhelming and confusing.  Avoid corrections that sound like scolding.  Try not to take it personally, even if the person forgets your name. BEHAVIOR CHANGES Behavior changes are part of the disease. The person may develop depression, anxiety, anger, hallucinations, or other behavior changes. These changes can come on suddenly and may be in response to pain, infection, changes in the environment (temperature, noise), overstimulation, or feeling lost or scared.   Try not to take behavior changes personally.  Remain calm and patient.  Do not argue or try to convince the person about a specific point. This will only make him or her more agitated.  Know that the behavior changes are part of the disease process and try to work through it. TIPS TO REDUCE FRUSTRATION  Schedule wisely by making appointments and doing daily tasks, like bathing and dressing, when the person is at his or her best.  Take your time. Simple tasks may take a lot longer, so be sure to allow for plenty of time.  Limit choices. Too many choices can be overwhelming and stressful for the person.  Involve the person in what you are doing.  Stick to a routine.  Avoid new or crowded situations, if  possible.  Use simple words, short sentences, and a calm voice. Only give one direction at a time.  Buy clothes and shoes that are easy to put on and take off.  Let people help if they offer. HOME SAFETY Keeping the home safe is very important to reduce the risk of falls and injuries.   Keep floors clear of clutter. Remove rugs, magazine racks, and floor lamps.  Keep hallways well lit.  Put a handrail and nonslip mat in the bathtub or shower.  Put childproof locks on cabinets with dangerous items, such as medicine, alcohol, guns, toxic cleaning items, sharp tools or utensils, matches, or lighters.  Place locks on doors where the person cannot easily see or reach them. This helps ensure that the person cannot wander out of the house and get lost.  Be prepared for emergencies. Keep a list of emergency phone numbers and addresses in a convenient area. PLANS FOR THE FUTURE  Do not put off talking about finances.  Talk about money management. People with Alzheimer disease have trouble managing their money as the disease gets worse.  Get help from professional advisors regarding financial and legal matters.  Do not put off talking about future care.  Choose a power of attorney. This is someone who can make decisions for the person with Alzheimer disease when he or she is no longer able to do so.  Talk about driving and when it is the right time to stop. The person's health care provider can help give advice on this matter.  Talk about  the person's living situation. If he or she lives alone, you need to make sure he or she is safe. Some people need extra help at home, and others need more care at a nursing home or care center. SUPPORT GROUPS Joining a support group can be very helpful for caregivers of people with Alzheimer disease. Some advantages to being part of a support group include:   Getting strategies to manage stress.  Sharing experiences with others.  Receiving  emotional comfort and support.  Learning new caregiving skills as the disease progresses.  Knowing what community resources are available and taking advantage of them. SEEK MEDICAL CARE IF:  The person has a fever.  The person has a sudden change in behavior that does not improve with calming strategies.  The person is unable to manage in his or her current living situation.  The person threatens you or anyone else, including himself or herself.  You are no longer able to care for the person.   This information is not intended to replace advice given to you by your health care provider. Make sure you discuss any questions you have with your health care provider.   Document Released: 11/09/2003 Document Revised: 03/20/2014 Document Reviewed: 04/05/2011 Elsevier Interactive Patient Education 2016 [place] [employer] Alzheimer disease is a mental disorder. It causes memory loss and loss of other mental functions, such as learning, thinking, problem solving, communicating, and completing tasks. The mental losses interfere with the ability to perform daily activities at work, at home, or in social situations. Alzheimer disease usually starts in a person's late 7s or early 21s but can start earlier in life (familial form). The mental changes caused by this disease are permanent and worsen over time. As the illness progresses, the ability to do even the simplest things is lost. Survival with Alzheimer disease ranges from several years to as long as 20 years. CAUSES Alzheimer disease is caused by abnormally high levels of a protein (beta-amyloid) in the brain. This protein forms very small deposits within and around the brain's nerve cells. These deposits prevent the nerve cells from working properly. Experts are not certain what causes the beta-amyloid deposits in this disease. RISK FACTORS The following major risk factors have been identified:  Increasing age.  Certain genetic  variations, such as Down syndrome (trisomy 21). SYMPTOMS In the early stages of Alzheimer disease, you are still able to perform daily activities but need greater effort, more time, or memory aids. Early symptoms include:  Mild memory loss of recent events, names, or phone numbers.  Loss of objects.  Minor loss of vocabulary.  Difficulty with complex tasks, such as paying bills or driving in unfamiliar locations. Other mental functions deteriorate as the disease worsens. These changes slowly go from mild to severe. Symptoms at this stage include:  Difficulty remembering. You may not be able to recall personal information such as your address and telephone number. You may become confused about the date, the season of the year, or your location.  Difficulty maintaining attention. You may forget what you wanted to say during conversations and repeat what you have already said.  Difficulty learning new information or tasks. You may not remember what you read or the name of a new friend you met.  Difficulty counting or doing math. You may have difficulty with complex math problems. You may make mistakes in paying bills or managing your checkbook.  Poor reasoning and judgment. You may make poor decisions or not dress right  for the weather.  Difficulty communicating. You may have regular difficulty remembering words, naming objects, expressing yourself clearly, or writing sentences that make sense.  Difficulty performing familiar daily activities. You may get lost driving in familiar locations or need help eating, bathing, dressing, grooming, or using the toilet. You may have difficulty maintaining bladder or bowel control.  Difficulty recognizing familiar faces. You may confuse family members or close friends with one another. You may not recognize a close relative or may mistake strangers for family. Alzheimer disease also may cause changes in personality and behavior. These changes include:     Loss of interest or motivation.  Social withdrawal.  Anxiety.  Difficulty sleeping.  Uncharacteristic anger or combativeness.  A false belief that someone is trying to harm you (paranoia).  Seeing things that are not real (hallucinations).  Agitation. Confusion and disruptive behavior are often worse at night and may be triggered by changes in the environment or acute medical issues. DIAGNOSIS  Alzheimer disease is diagnosed through an assessment by your health care provider. During this assessment, your health care provider will do the following:  Ask you and your family, friends, or caregivers questions about your symptoms, their frequency, their duration and progression, and the effect they are having on your life.  Ask questions about your personal and family medical history and use of alcohol or drugs, including prescription medicine.  Perform a physical exam and order blood tests and brain imaging exams. Your health care provider may refer you to a specialist for detailed evaluation of your mental functions (neuropsychological testing).  Many different brain disorders, medical conditions, and certain substances can cause symptoms that resemble Alzheimer disease symptoms. These must be ruled out before this disease can be diagnosed. If Alzheimer disease is diagnosed, it will be considered either "possible" or "probable" Alzheimer disease. "Possible" Alzheimer disease means that your symptoms are typical of the disease and no other disorder is causing them. "Probable" Alzheimer disease means that you also have a family history of the disease or genetic test results that support the diagnosis. Certain tests, mostly used in research studies, are highly specific for Alzheimer disease.  TREATMENT  There is currently no cure for this disease. The goals of treatment are to:  Slow down the progression of the disease.  Preserve mental function as long as possible.  Manage behavioral  symptoms.  Make life easier for the person with Alzheimer disease and his or her caregivers. The following treatment options are available:  Medicine. Certain medicines may help slow memory loss by changing the level of certain chemicals in the brain. Medicine may also help with behavioral symptoms.  Talk therapy. Talk therapy provides education, support, and memory aids for people with this disease. It is most effective in the early stages of the illness.  Caregiving. Caregivers may be family members, friends, or trained medical professionals. They help the person with Alzheimer disease with daily life activities. Caregiving may take place at home or at a nursing facility.  Family support groups. These provide education, emotional support, and information about community resources to family members who are taking care of the person with this disease.   This information is not intended to replace advice given to you by your health care provider. Make sure you discuss any questions you have with your health care provider.   Document Released: 11/09/2003 Document Revised: 03/20/2014 Document Reviewed: 07/05/2012 Elsevier Interactive Patient Education 2016 [employer] Dementia Dementia is a word that is used to describe  problems with the brain and how it works. People with dementia have memory loss. They may also have problems with thinking, speaking, or solving problems. It can affect how they act around people, how they do their job, their mood, and their personality. These changes may not show up for a long time. Family or friends may not notice problems in the early part of this disease. HOME CARE The following tips are for the person living with, or caring for, the person with dementia. Make the home safe.  Remove locks on bathroom doors.  Use childproof locks on cabinets where alcohol, cleaning supplies, or chemicals are stored.  Put outlet covers in electrical outlets.  Put in  childproof locks to keep doors and windows safe.  Remove stove knobs, or put in safety knobs that shut off on their own.  Lower the temperature on water heaters.  Label medicines. Lock them in a safe place.  Keep knives, lighters, matches, power tools, and guns out of reach or in a safe place.  Remove objects that might break or can hurt the person.  Make sure lighting is good inside and outside.  Put in grab bars if needed.  Use a device that detects falls or other needs for help. Lessen confusion.  Keep familiar objects and people around.  Use night lights or low lit (dim) lights at night.  Label objects or areas.  Use reminders, notes, or directions for daily activities or tasks.  Keep a simple routine that is the same for waking, meals, bathing, dressing, and bedtime.  Create a calm and quiet home.  Put up clocks and calendars.  Keep emergency numbers and the home address near all phones.  Help show the different times of day. Open the curtains during the day to let light in. Speak clearly and directly.  Choose simple words and short sentences.  Use a gentle, calm voice.  Do not interrupt.  If the person has a hard time finding a word to use, give them the word or thought.  Ask 1 question at a time. Give enough time for the person to answer. Repeat the question if the person does not answer. Do things that lessen restlessness.  Provide a comfortable bed.  Have the same bedtime routine every night.  Have a regular walking and activity schedule.  Lessen naps during the day.  Do not let the person drink a lot of caffeine.  Go to events that are not overwhelming. Eat well and drink fluids.  Lessen distractions during meal times and snacks.  Avoid foods that are too hot or too cold.  Watch how the person chews and swallows. This is to make sure they do not choke. Other  Keep all vision, hearing, dental, and medical visits with the doctor.  Only  give medicines as told by the doctor.  Watch the person's driving ability. Do not let the person drive if he or she cannot drive safely.  Use a program that helps find a person if they become missing. You may need to register with this program. GET HELP RIGHT AWAY IF:   A fever of 102 F (38.9 C) develops.  Confusion develops or gets worse.  Sleepiness develops or gets worse.  Staying awake is hard to do.  New behavior problems start like mood swings, aggression, and seeing things that are not there.  Problems with balance, speech, or falling develop.  Problems swallowing develop.  Any problems of another sickness develop. MAKE SURE YOU:  Understand these instructions.  Will watch his or her condition.  Will get help right away if he or she is not doing well or gets worse.   This information is not intended to replace advice given to you by your health care provider. Make sure you discuss any questions you have with your health care provider.   Document Released: 02/10/2008 Document Revised: 05/22/2011 Document Reviewed: 07/25/2010 Elsevier Interactive Patient Education [employer].

## 2015-07-13 ENCOUNTER — Telehealth: Payer: Self-pay | Admitting: Neurology

## 2015-07-13 NOTE — Telephone Encounter (Signed)
Spoke to pt's husband. He states that Dr. Brett Fairy prescribed namzaric for the pt yesterday and therefore we need to remove donepezil and memantine from pt's medication list since she is taking the namzaric now. Will update medication list.

## 2015-07-13 NOTE — Telephone Encounter (Signed)
Pt's husband called in about the list of medications they received. He has questions about certain ones and when to take. Please call [phone]

## 2015-07-14 DIAGNOSIS — H353132 Nonexudative age-related macular degeneration, bilateral, intermediate dry stage: Secondary | ICD-10-CM | POA: Diagnosis not present

## 2015-07-14 DIAGNOSIS — H43813 Vitreous degeneration, bilateral: Secondary | ICD-10-CM | POA: Diagnosis not present

## 2015-07-14 DIAGNOSIS — H01001 Unspecified blepharitis right upper eyelid: Secondary | ICD-10-CM | POA: Diagnosis not present

## 2015-07-14 DIAGNOSIS — Z0100 Encounter for examination of eyes and vision without abnormal findings: Secondary | ICD-10-CM | POA: Diagnosis not present

## 2015-07-16 ENCOUNTER — Ambulatory Visit (INDEPENDENT_AMBULATORY_CARE_PROVIDER_SITE_OTHER): Payer: Medicare Other

## 2015-07-16 DIAGNOSIS — Z5181 Encounter for therapeutic drug level monitoring: Secondary | ICD-10-CM

## 2015-07-16 DIAGNOSIS — Z7901 Long term (current) use of anticoagulants: Secondary | ICD-10-CM

## 2015-07-16 DIAGNOSIS — I480 Paroxysmal atrial fibrillation: Secondary | ICD-10-CM | POA: Diagnosis not present

## 2015-07-16 DIAGNOSIS — I4891 Unspecified atrial fibrillation: Secondary | ICD-10-CM

## 2015-07-16 LAB — POCT INR: INR: 2

## 2015-07-21 ENCOUNTER — Other Ambulatory Visit: Payer: Self-pay | Admitting: *Deleted

## 2015-07-21 ENCOUNTER — Other Ambulatory Visit: Payer: Self-pay | Admitting: Cardiology

## 2015-07-26 ENCOUNTER — Other Ambulatory Visit: Payer: Medicare Other

## 2015-07-26 ENCOUNTER — Ambulatory Visit
Admission: RE | Admit: 2015-07-26 | Discharge: 2015-07-26 | Disposition: A | Discharge status: Home / Self Care | Payer: Medicare Other | Source: Ambulatory Visit | Attending: Neurology | Admitting: Neurology

## 2015-07-26 DIAGNOSIS — F0280 Dementia in other diseases classified elsewhere without behavioral disturbance: Secondary | ICD-10-CM | POA: Diagnosis not present

## 2015-07-26 DIAGNOSIS — G309 Alzheimer's disease, unspecified: Principal | ICD-10-CM

## 2015-07-26 MED ORDER — GADOBENATE DIMEGLUMINE 529 MG/ML IV SOLN
9.0000 mL | Freq: Once | INTRAVENOUS | Status: AC | PRN
  Administered 2015-07-26: 9 mL via INTRAVENOUS

## 2015-07-27 ENCOUNTER — Telehealth: Payer: Self-pay

## 2015-07-27 DIAGNOSIS — H6121 Impacted cerumen, right ear: Secondary | ICD-10-CM | POA: Diagnosis not present

## 2015-07-27 DIAGNOSIS — J029 Acute pharyngitis, unspecified: Secondary | ICD-10-CM | POA: Diagnosis not present

## 2015-07-27 NOTE — Telephone Encounter (Signed)
-----   Message from Larey Seat, MD sent at 07/27/2015 12:28 PM EDT ----- Mesio temporal atrophy bilaterally, consistent with Alzheimer's disease.

## 2015-07-27 NOTE — Telephone Encounter (Signed)
I spoke to pt and advised her that Dr. Brett Fairy reviewed her MRI results and wanted pt to now that there was mesio temporal atrophy bilaterally and this is consistent with alzheimer's disease. Pt says "I already knew this and thought this was coming." Pt knows to follow up in August with Dr. Brett Fairy. Pt verbalized understanding of results. Pt had no questions at this time but was encouraged to call back if questions arise.

## 2015-07-30 DIAGNOSIS — D1800 Hemangioma unspecified site: Secondary | ICD-10-CM | POA: Diagnosis not present

## 2015-07-30 DIAGNOSIS — L814 Other melanin hyperpigmentation: Secondary | ICD-10-CM | POA: Diagnosis not present

## 2015-07-30 DIAGNOSIS — D225 Melanocytic nevi of trunk: Secondary | ICD-10-CM | POA: Diagnosis not present

## 2015-07-30 DIAGNOSIS — L821 Other seborrheic keratosis: Secondary | ICD-10-CM | POA: Diagnosis not present

## 2015-08-13 ENCOUNTER — Ambulatory Visit (INDEPENDENT_AMBULATORY_CARE_PROVIDER_SITE_OTHER): Payer: Medicare Other | Admitting: *Deleted

## 2015-08-13 DIAGNOSIS — I480 Paroxysmal atrial fibrillation: Secondary | ICD-10-CM

## 2015-08-13 DIAGNOSIS — Z5181 Encounter for therapeutic drug level monitoring: Secondary | ICD-10-CM

## 2015-08-13 DIAGNOSIS — Z7901 Long term (current) use of anticoagulants: Secondary | ICD-10-CM

## 2015-08-13 DIAGNOSIS — I4891 Unspecified atrial fibrillation: Secondary | ICD-10-CM

## 2015-08-13 LAB — POCT INR: INR: 2

## 2015-08-20 DIAGNOSIS — Z1231 Encounter for screening mammogram for malignant neoplasm of breast: Secondary | ICD-10-CM | POA: Diagnosis not present

## 2015-08-20 DIAGNOSIS — M85851 Other specified disorders of bone density and structure, right thigh: Secondary | ICD-10-CM | POA: Diagnosis not present

## 2015-08-20 DIAGNOSIS — M810 Age-related osteoporosis without current pathological fracture: Secondary | ICD-10-CM | POA: Diagnosis not present

## 2015-08-20 DIAGNOSIS — M85852 Other specified disorders of bone density and structure, left thigh: Secondary | ICD-10-CM | POA: Diagnosis not present

## 2015-08-31 DIAGNOSIS — H903 Sensorineural hearing loss, bilateral: Secondary | ICD-10-CM | POA: Diagnosis not present

## 2015-08-31 DIAGNOSIS — H9201 Otalgia, right ear: Secondary | ICD-10-CM | POA: Diagnosis not present

## 2015-09-07 DIAGNOSIS — K5901 Slow transit constipation: Secondary | ICD-10-CM | POA: Diagnosis not present

## 2015-09-07 DIAGNOSIS — I482 Chronic atrial fibrillation: Secondary | ICD-10-CM | POA: Diagnosis not present

## 2015-09-07 DIAGNOSIS — G301 Alzheimer's disease with late onset: Secondary | ICD-10-CM | POA: Diagnosis not present

## 2015-09-07 DIAGNOSIS — F419 Anxiety disorder, unspecified: Secondary | ICD-10-CM | POA: Diagnosis not present

## 2015-09-07 DIAGNOSIS — Z79899 Other long term (current) drug therapy: Secondary | ICD-10-CM | POA: Diagnosis not present

## 2015-09-07 DIAGNOSIS — I10 Essential (primary) hypertension: Secondary | ICD-10-CM | POA: Diagnosis not present

## 2015-09-07 DIAGNOSIS — K219 Gastro-esophageal reflux disease without esophagitis: Secondary | ICD-10-CM | POA: Diagnosis not present

## 2015-09-10 ENCOUNTER — Ambulatory Visit (INDEPENDENT_AMBULATORY_CARE_PROVIDER_SITE_OTHER): Payer: Medicare Other

## 2015-09-10 DIAGNOSIS — Z5181 Encounter for therapeutic drug level monitoring: Secondary | ICD-10-CM | POA: Diagnosis not present

## 2015-09-10 DIAGNOSIS — I4891 Unspecified atrial fibrillation: Secondary | ICD-10-CM

## 2015-09-10 DIAGNOSIS — I480 Paroxysmal atrial fibrillation: Secondary | ICD-10-CM | POA: Diagnosis not present

## 2015-09-10 DIAGNOSIS — Z7901 Long term (current) use of anticoagulants: Secondary | ICD-10-CM | POA: Diagnosis not present

## 2015-09-10 LAB — POCT INR: INR: 2.8

## 2015-10-05 DIAGNOSIS — G301 Alzheimer's disease with late onset: Secondary | ICD-10-CM | POA: Diagnosis not present

## 2015-10-05 DIAGNOSIS — I10 Essential (primary) hypertension: Secondary | ICD-10-CM | POA: Diagnosis not present

## 2015-10-05 DIAGNOSIS — Z1389 Encounter for screening for other disorder: Secondary | ICD-10-CM | POA: Diagnosis not present

## 2015-10-05 DIAGNOSIS — E46 Unspecified protein-calorie malnutrition: Secondary | ICD-10-CM | POA: Diagnosis not present

## 2015-10-05 DIAGNOSIS — I482 Chronic atrial fibrillation: Secondary | ICD-10-CM | POA: Diagnosis not present

## 2015-10-05 DIAGNOSIS — Z0000 Encounter for general adult medical examination without abnormal findings: Secondary | ICD-10-CM | POA: Diagnosis not present

## 2015-10-05 DIAGNOSIS — R490 Dysphonia: Secondary | ICD-10-CM | POA: Diagnosis not present

## 2015-10-08 ENCOUNTER — Ambulatory Visit (INDEPENDENT_AMBULATORY_CARE_PROVIDER_SITE_OTHER): Payer: Medicare Other | Admitting: *Deleted

## 2015-10-08 DIAGNOSIS — I4891 Unspecified atrial fibrillation: Secondary | ICD-10-CM

## 2015-10-08 DIAGNOSIS — Z7901 Long term (current) use of anticoagulants: Secondary | ICD-10-CM

## 2015-10-08 DIAGNOSIS — I480 Paroxysmal atrial fibrillation: Secondary | ICD-10-CM | POA: Diagnosis not present

## 2015-10-08 DIAGNOSIS — Z5181 Encounter for therapeutic drug level monitoring: Secondary | ICD-10-CM | POA: Diagnosis not present

## 2015-10-08 LAB — POCT INR: INR: 1.6

## 2015-10-12 ENCOUNTER — Ambulatory Visit (INDEPENDENT_AMBULATORY_CARE_PROVIDER_SITE_OTHER): Payer: Medicare Other | Admitting: Neurology

## 2015-10-12 ENCOUNTER — Other Ambulatory Visit: Payer: Self-pay | Admitting: Neurology

## 2015-10-12 ENCOUNTER — Encounter: Payer: Self-pay | Admitting: Neurology

## 2015-10-12 VITALS — BP 110/52 | HR 60 | Resp 20 | Ht 61.5 in | Wt 98.5 lb

## 2015-10-12 DIAGNOSIS — R42 Dizziness and giddiness: Secondary | ICD-10-CM | POA: Diagnosis not present

## 2015-10-12 DIAGNOSIS — R490 Dysphonia: Secondary | ICD-10-CM

## 2015-10-12 DIAGNOSIS — G300 Alzheimer's disease with early onset: Secondary | ICD-10-CM

## 2015-10-12 DIAGNOSIS — G309 Alzheimer's disease, unspecified: Principal | ICD-10-CM

## 2015-10-12 DIAGNOSIS — F0280 Dementia in other diseases classified elsewhere without behavioral disturbance: Secondary | ICD-10-CM

## 2015-10-12 DIAGNOSIS — I2510 Atherosclerotic heart disease of native coronary artery without angina pectoris: Secondary | ICD-10-CM

## 2015-10-12 NOTE — Patient Instructions (Signed)
Alzheimer Disease °Alzheimer disease is a mental disorder. It causes memory loss and loss of other mental functions, such as learning, thinking, problem solving, communicating, and completing tasks. The mental losses interfere with the ability to perform daily activities at work, at home, or in social situations. °Alzheimer disease usually starts in a person's late 60s or early 70s but can start earlier in life (familial form). The mental changes caused by this disease are permanent and worsen over time. As the illness progresses, the ability to do even the simplest things is lost. Survival with Alzheimer disease ranges from several years to as long as 20 years. °CAUSES °Alzheimer disease is caused by abnormally high levels of a protein (beta-amyloid) in the brain. This protein forms very small deposits within and around the brain's nerve cells. These deposits prevent the nerve cells from working properly. Experts are not certain what causes the beta-amyloid deposits in this disease. °RISK FACTORS °The following major risk factors have been identified: °· Increasing age. °· Certain genetic variations, such as Down syndrome (trisomy 21). °SYMPTOMS °In the early stages of Alzheimer disease, you are still able to perform daily activities but need greater effort, more time, or memory aids. Early symptoms include: °· Mild memory loss of recent events, names, or phone numbers. °· Loss of objects. °· Minor loss of vocabulary. °· Difficulty with complex tasks, such as paying bills or driving in unfamiliar locations. °Other mental functions deteriorate as the disease worsens. These changes slowly go from mild to severe. Symptoms at this stage include: °· Difficulty remembering. You may not be able to recall personal information such as your address and telephone number. You may become confused about the date, the season of the year, or your location. °· Difficulty maintaining attention. You may forget what you wanted to say  during conversations and repeat what you have already said. °· Difficulty learning new information or tasks. You may not remember what you read or the name of a new friend you met. °· Difficulty counting or doing math. You may have difficulty with complex math problems. You may make mistakes in paying bills or managing your checkbook. °· Poor reasoning and judgment. You may make poor decisions or not dress right for the weather. °· Difficulty communicating. You may have regular difficulty remembering words, naming objects, expressing yourself clearly, or writing sentences that make sense. °· Difficulty performing familiar daily activities. You may get lost driving in familiar locations or need help eating, bathing, dressing, grooming, or using the toilet. You may have difficulty maintaining bladder or bowel control. °· Difficulty recognizing familiar faces. You may confuse family members or close friends with one another. You may not recognize a close relative or may mistake strangers for family. °Alzheimer disease also may cause changes in personality and behavior. These changes include:  °· Loss of interest or motivation. °· Social withdrawal. °· Anxiety. °· Difficulty sleeping. °· Uncharacteristic anger or combativeness. °· A false belief that someone is trying to harm you (paranoia). °· Seeing things that are not real (hallucinations). °· Agitation. °Confusion and disruptive behavior are often worse at night and may be triggered by changes in the environment or acute medical issues. °DIAGNOSIS  °Alzheimer disease is diagnosed through an assessment by your health care provider. During this assessment, your health care provider will do the following: °· Ask you and your family, friends, or caregivers questions about your symptoms, their frequency, their duration and progression, and the effect they are having on your life. °·   Ask questions about your personal and family medical history and use of alcohol or drugs,  including prescription medicine.  Perform a physical exam and order blood tests and brain imaging exams. Your health care provider may refer you to a specialist for detailed evaluation of your mental functions (neuropsychological testing).  Many different brain disorders, medical conditions, and certain substances can cause symptoms that resemble Alzheimer disease symptoms. These must be ruled out before this disease can be diagnosed. If Alzheimer disease is diagnosed, it will be considered either "possible" or "probable" Alzheimer disease. "Possible" Alzheimer disease means that your symptoms are typical of the disease and no other disorder is causing them. "Probable" Alzheimer disease means that you also have a family history of the disease or genetic test results that support the diagnosis. Certain tests, mostly used in research studies, are highly specific for Alzheimer disease.  TREATMENT  There is currently no cure for this disease. The goals of treatment are to:  Slow down the progression of the disease.  Preserve mental function as long as possible.  Manage behavioral symptoms.  Make life easier for the person with Alzheimer disease and his or her caregivers. The following treatment options are available:  Medicine. Certain medicines may help slow memory loss by changing the level of certain chemicals in the brain. Medicine may also help with behavioral symptoms.  Talk therapy. Talk therapy provides education, support, and memory aids for people with this disease. It is most effective in the early stages of the illness.  Caregiving. Caregivers may be family members, friends, or trained medical professionals. They help the person with Alzheimer disease with daily life activities. Caregiving may take place at home or at a nursing facility.  Family support groups. These provide education, emotional support, and information about community resources to family members who are taking care of  the person with this disease.   This information is not intended to replace advice given to you by your health care provider. Make sure you discuss any questions you have with your health care provider.   Document Released: 11/09/2003 Document Revised: 03/20/2014 Document Reviewed: 07/05/2012 Elsevier Interactive Patient Education [employer].

## 2015-10-12 NOTE — Progress Notes (Signed)
[facility]  Provider:  Larey Seat, M D  Referring Provider: Lajean Manes, MD Primary Care Physician:  Mathews Argyle, MD  Chief Complaint  Patient presents with  . Follow-up    memory    HPI:  [name] is a 82 y.o. female , a retired Holiday representative , and seen here as a referral  from Dr. Felipa Eth for progressive memory loss, primarily anomia.   The [facility] are her to transfer her care from Dr Tomi Likens, based on her desire to see a female provider. She has been followed over several visits by Dr. 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 The patient has a history of paroxysmal atrial fibrillation and is on chronic anticoagulation, hypertension, coronary artery disease, polymyalgia rheumatica, depression and irritable bowel. She has meanwhile advanced from Aricept 5 mg to 10 mg daily and takes Namenda 10 mg twice daily she reports that she has more and more difficulties with short-term memory, is often very insecure about the date, about upcoming appointments, and still also confirms that she has trouble finding words sometimes midsentence. She doesn't hesitate on the phone with friends or  her adult children.  At church , she cannot longer name her co-parrisheners. She has also noticed that her sleep is now more fragmented she seems to frequently wake up between 2 and 3 AM which was not the case in 2015. She sometimes taking melatonin then after arousing from sleep. We will discuss this today as well I would also like to her Montral cognitive assessment scores. On 11/21/2013 21 points on 06/16/2014 23 points on April 13 this year 16 points. She continues to Yoga twice weekly. She finds herself in a peculiar situation at the book club, as she forgets the books contents. She still plays  piano.  We will also help to establish a bedtime routine, some comforting rituals.     MMSE - Mini Mental State Exam 10/12/2015 07/12/2015 12/21/2014  Orientation to time 5 4 5   Orientation to Place 4 4 4   Registration 3 3 3   Attention/ Calculation 5 3 5   Recall 1 0 0  Language- name 2 objects 2 2 2   Language- repeat 0 1 1  Language- follow 3 step command 3 3 3   Language- read & follow direction 1 1 1   Write a sentence 1 1 1   Copy design 0 1 1  Total score 25 23 26       Last visit note;   [name] is a Caucasian, married, right-handed female seen today at the request of Dr. Felipa Eth. She reports that she noted for a period of about 2 years progressive loss of memory.   She describes is a typical for short-term memory loss for example she has trouble finding the name of certain rules or or of people she meets and is  acquainted with. Her husband just noticed over the last couple of months and that she sometimes exchanges the names of 2 neighbors ;[place] Kidney and [place]. She has also developed some coping strategies; for example she will use a shopping list to make sure that she does not miss any items, and she also has somewhat refrained from conversations out of fear that she would not be able to contribute to be as effective as she would like to be. [name] also as  a member of a book club (where she feels unpressured and is normally one of the quieter members) that she has not felt a limitation to her ability to eloquently expressing her ideas thoughts and feelings in that comfortable environment.  She has not noted any difficulties in phone conversations.  She had trouble finding a measuring cup in her own kitchen. She wasn't sure what restaurant a friend talked about , even that she had been there. Once the friend told her it's location, she knew immediately. She feels troubled by not being able to give directions to some areas of the city, she has lived in for 67 years. Her problem is  that she cannot place the names of streets, but if given a visual help point, she would be able to drive.  No paraphrasia's or neologisms.  Reading books mostly in the evenings, she often has to repeat a chapter to connect to the story again. She is calling her problem " forgetfulness'.  She has mixed up times of appointments , forgot when a friend will call her, even that this has been a routine for many years , 8 AM on Wednesdays.  She  and her husband are empty nesters. Her husband suffered a stroke with residual aphasia 4 years ago.  Her friends tell her the same problems, mostly "things float up" , meaning they have a delay in word recall , too.   Interval history from 10/12/2015 [name] is seen here today and reports feeling lightheaded slightly dizzy. She still hydrating well her mealtimes haven't changed she does not have any cardiac complications but it is likely that this is a symptom or side effects of Aricept. She has been changed to numb 0 take once combining Namenda and Aricept in one pillow. I will asked her today to no longer takes the medication in the morning but rather in the evening. Unfortunately, Aricept may lead to visit dreams but the lightheadedness component should be better tolerated if it occurs overnight rather than drinking daytime. In today's memory test be chose a mini mental status evaluation at the patient scored 25 out of 30 points. Her MRI of the brain with and without contrast was performed on 07/26/2015 and the extent of atrophy which was considered moderate cortical atrophy is most pronounced in the mesial temporal lobes. This is a typical distribution pattern for Alzheimer's dementia. The extent however has not increased significantly since April 2015.   Review of Systems:  Out of a complete 14 system review, the patient complains of only the following symptoms, and all other reviewed systems are negative. Memory loss .She has sometimes  chronically 3-4  times a week. No pain problems, No falls, no balance difficulties. The patient has a very good posture, supple muscle tone, symmetric strength and normal reflexes.     Social History   Social History  . Marital status: Married    Spouse name: Jeneen Rinks  . Number of children: 2  . Years of education: Masters   Occupational History  . Retired Retired   Social History Main Topics  . Smoking status: Never Smoker  . Smokeless tobacco: Never Used  . Alcohol use No  . Drug use: No  . Sexual activity: No   Other Topics Concern  . Not on file   Social History Narrative   Patient is married Jeneen Rinks) and lives at home with her husband.   Patient has two children.   Retired; Married;   Patient is right-handed.  Patient drinks very little caffeine.   Patient has a Scientist, water quality.   Has had increased stress as her husband had a stroke ~ 3wks ago and she has been taking care of him. (04/07/11)    Family History  Problem Relation Age of Onset  . Transient ischemic attack Mother   . Stroke Mother   . Cancer Father     PROSTATE  . Pancreatitis Father   . Stroke Sister   . Spina bifida Brother   . Hypertension Brother   . Heart disease Brother   . Hypertension Sister   . Diabetes Sister   . Heart disease Sister   . Dementia Sister     Past Medical History:  Diagnosis Date  . Anxiety   . Atrophic vaginitis   . CAD (coronary artery disease) 2008/2010   Catheterization 2008, 40% LAD after first diagonal  /  nuclear January, 2010 normal  . Carotid artery disease ([place])    Doppler, July, 2011, 0-39% bilateral  . Decreased platelet count ([place]) 04/2011   142,000  . Endometrial polyp   . GERD (gastroesophageal reflux disease)   . Hyperlipidemia   . Hypertension   . Irritable bowel syndrome with constipation   . Memory difficulty 01-20-13   "memory issues"  . Normal nuclear stress test Feb 2013   No ischemia. Normal wall motion. EF greater than 70%  . Osteoarthritis   .  Osteopenia   . Osteoporosis   . Palpitations    Monitor, May, 2013, no atrial fib,  one short run of PSVT  . Paroxysmal atrial fibrillation ([facility])    Coumadin therapy  . Polymyalgia rheumatica ([place])   . Spinal stenosis   . Stress   . Vitamin D deficiency     Past Surgical History:  Procedure Laterality Date  . BREAST SURGERY     BENIGN BREAST LUMP  . CATARACT SURGERY  2009  . COLONOSCOPY WITH PROPOFOL N/A 02/11/2013   Procedure: COLONOSCOPY WITH PROPOFOL;  Surgeon: [name], MD;  Location: [facility];  Service: Endoscopy;  Laterality: N/A;  . DILATION AND CURETTAGE OF UTERUS    . EXCISION MORTON'S NEUROMA  2000  . HIP SURGERY Right 2005   debridement  . HYSTEROSCOPY  02/07/2005   HYST W D&C, DX: PMB W SMALL ENDO POLYP-SURGEON [name]  . LAMINECTOMY  March 2009   L4 through L5  . TONSILLECTOMY      Current Outpatient Prescriptions  Medication Sig Dispense Refill  . lisinopril (PRINIVIL,ZESTRIL) 5 MG tablet Take 5 mg by mouth daily.     . Melatonin 1 MG TABS Take 1 tablet by mouth at bedtime.     . metoprolol tartrate (LOPRESSOR) 25 MG tablet Take 25 mg by mouth 2 (two) times daily.    [name] NAMZARIC 28-10 MG CP24 TAKE ONE CAPSULE BY MOUTH EVERY MORNING 30 capsule 0  . warfarin (COUMADIN) 5 MG tablet TAKE TABLETS BY MOUTH AS DIRECTED BY ANTICOAGULATION CLINIC. 120 tablet 0   No current facility-administered medications for this visit.     Allergies as of 10/12/2015  . (No Known Allergies)    Vitals: BP (!) 110/52   Pulse 60   Resp 20   Ht 5' 1.5" (1.562 m)   Wt 98 lb 8 oz (44.7 kg)   BMI 18.31 kg/m  Last Weight:  Wt Readings from Last 1 Encounters:  10/12/15 98 lb 8 oz (44.7 kg)   Last Height:   Ht Readings from Last 1 Encounters:  10/12/15 5' 1.5" (1.562 m)    Physical exam:  General: The patient is awake, alert and appears not in acute distress. The patient is well groomed. Head: Normocephalic, atraumatic. Neck is supple. Mallampati 2, neck  circumference: 14 inches  Cardiovascular:  irregular rate and rhythm- atrial fibrillation , patient reports flutter. no carotid bruit, and without distended neck veins. Respiratory: Lungs are clear to auscultation. Skin:  Without evidence of edema, or rash Trunk: normal posture.  Neurologic exam : The patient is awake and alert, oriented to place and time.  Memory subjective  described as progressively impaired, MOCA here was 3 month ago 19-30 (per Dr. Carlyle Lipa chart, her MMSE was 28-11 July 2011) .  MMSE - Mini Mental State Exam 10/12/2015 07/12/2015 12/21/2014  Orientation to time 5 4 5   Orientation to Place 4 4 4   Registration 3 3 3   Attention/ Calculation 5 3 5   Recall 1 0 0  Language- name 2 objects 2 2 2   Language- repeat 0 1 1  Language- follow 3 step command 3 3 3   Language- read & follow direction 1 1 1   Write a sentence 1 1 1   Copy design 0 1 1  Total score 25 23 26      Montreal Cognitive Assessment  06/24/2015 06/16/2014 06/16/2014 11/21/2013  Visuospatial/ Executive (0/5) 2 4 4 3   Naming (0/3) 3 2 2 3   Attention: Read list of digits (0/2) 2 2 2 2   Attention: Read list of letters (0/1) 1 1 1 1   Attention: Serial 7 subtraction starting at 100 (0/3) 1 3 3 3   Language: Repeat phrase (0/2) 2 2 2 2   Language : Fluency (0/1) 1 1 1 1   Abstraction (0/2) 1 2 2 1   Delayed Recall (0/5) 0 0 0 0  Orientation (0/6) 3 6 - 5  Total 16 23 - 21  Adjusted Score (based on education) 16 23 - 21   There is a normal attention span & concentration ability. Speech is fluent with hoarseness, dysphonia not aphasia. Mood and affect are appropriate. She is quiet and demure, anxious " inside ".   Cranial nerves: Pupils are equal and briskly reactive to light. Funduscopic exam without pallor or edema. Status post cataract. Extraocular movements  in vertical and horizontal planes intact , with gaze to the left , her right eyelid closes , with prolonged upward gaze, ptosis is noted on the right.    Visual fields by finger perimetry are intact. Hearing to air-conduction impaired on the right, louder in bone conduction. Facial sensation intact to fine touch.Facial motor strength is symmetric and tongue and uvula move midline.  Motor exam: Normal tone , muscle bulk and symmetric normal strength in all extremities. Sensory:  Fine touch, pinprick and vibration were tested in all extremities. Coordination: Rapid alternating movements in the fingers/hands is tested and normal. Finger-to-nose maneuver  without evidence of ataxia, only mild dysmetria and no tremor. Gait and station: Patient walks without assistive device . Deep tendon reflexes: in the  upper and lower extremities are symmetric and intact. Babinski maneuver response is downgoing.   Assessment:  After physical and neurologic examination, review of laboratory studies, imaging, neurophysiology testing and pre-existing records, assessment is   Mrs. Gangwer has developed a cognitive deficit by [facility] cognitive assessment test that would place her into the category of early dementia, given her MRI distribution of atrophy,  this would be the Alzheimer's type.  She was previously diagnosed with a amnestic mild cognitive impairment, which  has a conversion rate to Alzheimer's dementia of 7% per year. Today's Mini-Mental Status Examination showed 23 out of 30 points with her greatest difficulties and the serial 7 category. Using the backward spelling as an alternative test the patient actually scored 4 points more 27 out of 30. She has never liked numbers and never was good in math.  The Parisi's are moving to a [place], next month.  I like for Mrs. Kinnamon to establish a bedtime routine that does not include screen time. I would like for her to check her emails perhaps right after dinner but not before going to bed. She should take melatonin at a dose between 1 and 5 mg which of her comfortable at bedtime, this may help her to  wake up later than the usual interruption between 2 and 3 AM. Melatonin can increase vivid dreams and if she feels that this is a concern I would have changed my recommendations. She states that the dreams are not nightmarish and not threatening or burdensome. I like for her to continue Aricept as well as Namenda. My goal would be to increase the Aricept to 23 mg daily if tolerated. For now, on Namziric, she has begun feeling light headedness, and is bradycardic, I will therefor ask her to take the combination medication in PM instead of AM. She will also need a pill box maybe 1 with an alarm included so that she has reminder times. Continue Yoga, reading and piano, keep going out- be social.   I will try again to enroll the patient in a clinical study. A Alzheimer's trial but has been large through [facility] unfortunately excludes patients with atrial fibrillation. I will contact [employer] in  [place].     Seyon Strader, MD

## 2015-10-18 ENCOUNTER — Encounter (INDEPENDENT_AMBULATORY_CARE_PROVIDER_SITE_OTHER): Payer: Self-pay

## 2015-10-18 ENCOUNTER — Ambulatory Visit (INDEPENDENT_AMBULATORY_CARE_PROVIDER_SITE_OTHER): Payer: Medicare Other | Admitting: Pharmacist

## 2015-10-18 DIAGNOSIS — Z5181 Encounter for therapeutic drug level monitoring: Secondary | ICD-10-CM | POA: Diagnosis not present

## 2015-10-18 DIAGNOSIS — Z7901 Long term (current) use of anticoagulants: Secondary | ICD-10-CM | POA: Diagnosis not present

## 2015-10-18 DIAGNOSIS — I4891 Unspecified atrial fibrillation: Secondary | ICD-10-CM

## 2015-10-18 DIAGNOSIS — I480 Paroxysmal atrial fibrillation: Secondary | ICD-10-CM | POA: Diagnosis not present

## 2015-10-18 LAB — POCT INR: INR: 2.3

## 2015-11-11 ENCOUNTER — Other Ambulatory Visit: Payer: Self-pay | Admitting: Neurology

## 2015-11-11 DIAGNOSIS — G309 Alzheimer's disease, unspecified: Principal | ICD-10-CM

## 2015-11-11 DIAGNOSIS — F0280 Dementia in other diseases classified elsewhere without behavioral disturbance: Secondary | ICD-10-CM

## 2015-11-17 ENCOUNTER — Ambulatory Visit (INDEPENDENT_AMBULATORY_CARE_PROVIDER_SITE_OTHER): Payer: Medicare Other | Admitting: *Deleted

## 2015-11-17 DIAGNOSIS — Z7901 Long term (current) use of anticoagulants: Secondary | ICD-10-CM | POA: Diagnosis not present

## 2015-11-17 DIAGNOSIS — I480 Paroxysmal atrial fibrillation: Secondary | ICD-10-CM | POA: Diagnosis not present

## 2015-11-17 DIAGNOSIS — Z5181 Encounter for therapeutic drug level monitoring: Secondary | ICD-10-CM

## 2015-11-17 DIAGNOSIS — I4891 Unspecified atrial fibrillation: Secondary | ICD-10-CM | POA: Diagnosis not present

## 2015-11-17 LAB — POCT INR: INR: 1

## 2015-11-22 DIAGNOSIS — H353122 Nonexudative age-related macular degeneration, left eye, intermediate dry stage: Secondary | ICD-10-CM | POA: Diagnosis not present

## 2015-11-24 DIAGNOSIS — H353211 Exudative age-related macular degeneration, right eye, with active choroidal neovascularization: Secondary | ICD-10-CM | POA: Diagnosis not present

## 2015-11-24 DIAGNOSIS — H353121 Nonexudative age-related macular degeneration, left eye, early dry stage: Secondary | ICD-10-CM | POA: Diagnosis not present

## 2015-11-25 ENCOUNTER — Encounter (INDEPENDENT_AMBULATORY_CARE_PROVIDER_SITE_OTHER): Payer: Self-pay

## 2015-11-25 ENCOUNTER — Ambulatory Visit (INDEPENDENT_AMBULATORY_CARE_PROVIDER_SITE_OTHER): Payer: Medicare Other | Admitting: *Deleted

## 2015-11-25 DIAGNOSIS — Z5181 Encounter for therapeutic drug level monitoring: Secondary | ICD-10-CM

## 2015-11-25 DIAGNOSIS — I480 Paroxysmal atrial fibrillation: Secondary | ICD-10-CM | POA: Diagnosis not present

## 2015-11-25 DIAGNOSIS — Z7901 Long term (current) use of anticoagulants: Secondary | ICD-10-CM | POA: Diagnosis not present

## 2015-11-25 DIAGNOSIS — I4891 Unspecified atrial fibrillation: Secondary | ICD-10-CM | POA: Diagnosis not present

## 2015-11-25 LAB — POCT INR: INR: 2.1

## 2015-11-26 ENCOUNTER — Other Ambulatory Visit: Payer: Self-pay | Admitting: Cardiovascular Disease

## 2015-12-09 ENCOUNTER — Ambulatory Visit (INDEPENDENT_AMBULATORY_CARE_PROVIDER_SITE_OTHER): Payer: Medicare Other | Admitting: *Deleted

## 2015-12-09 DIAGNOSIS — I4891 Unspecified atrial fibrillation: Secondary | ICD-10-CM

## 2015-12-09 DIAGNOSIS — Z7901 Long term (current) use of anticoagulants: Secondary | ICD-10-CM

## 2015-12-09 DIAGNOSIS — Z5181 Encounter for therapeutic drug level monitoring: Secondary | ICD-10-CM

## 2015-12-09 DIAGNOSIS — I480 Paroxysmal atrial fibrillation: Secondary | ICD-10-CM

## 2015-12-09 LAB — POCT INR: INR: 2.5

## 2015-12-13 ENCOUNTER — Other Ambulatory Visit: Payer: Self-pay | Admitting: *Deleted

## 2015-12-13 DIAGNOSIS — F0280 Dementia in other diseases classified elsewhere without behavioral disturbance: Secondary | ICD-10-CM

## 2015-12-13 DIAGNOSIS — G309 Alzheimer's disease, unspecified: Principal | ICD-10-CM

## 2015-12-13 MED ORDER — MEMANTINE HCL-DONEPEZIL HCL ER 28-10 MG PO CP24: 1.0000 | ORAL_CAPSULE | Freq: Every evening | ORAL | 5 refills | Status: DC

## 2015-12-14 ENCOUNTER — Other Ambulatory Visit: Payer: Self-pay | Admitting: Neurology

## 2015-12-14 DIAGNOSIS — F0280 Dementia in other diseases classified elsewhere without behavioral disturbance: Secondary | ICD-10-CM

## 2015-12-14 DIAGNOSIS — G309 Alzheimer's disease, unspecified: Principal | ICD-10-CM

## 2015-12-23 DIAGNOSIS — Z23 Encounter for immunization: Secondary | ICD-10-CM | POA: Diagnosis not present

## 2015-12-24 ENCOUNTER — Ambulatory Visit: Payer: Medicare Other | Admitting: Neurology

## 2015-12-29 DIAGNOSIS — H353211 Exudative age-related macular degeneration, right eye, with active choroidal neovascularization: Secondary | ICD-10-CM | POA: Diagnosis not present

## 2015-12-31 DIAGNOSIS — R0982 Postnasal drip: Secondary | ICD-10-CM | POA: Diagnosis not present

## 2015-12-31 DIAGNOSIS — K219 Gastro-esophageal reflux disease without esophagitis: Secondary | ICD-10-CM | POA: Diagnosis not present

## 2016-01-07 ENCOUNTER — Ambulatory Visit (INDEPENDENT_AMBULATORY_CARE_PROVIDER_SITE_OTHER): Payer: Medicare Other | Admitting: *Deleted

## 2016-01-07 DIAGNOSIS — Z5181 Encounter for therapeutic drug level monitoring: Secondary | ICD-10-CM

## 2016-01-07 DIAGNOSIS — Z7901 Long term (current) use of anticoagulants: Secondary | ICD-10-CM | POA: Diagnosis not present

## 2016-01-07 DIAGNOSIS — I4891 Unspecified atrial fibrillation: Secondary | ICD-10-CM

## 2016-01-07 DIAGNOSIS — I480 Paroxysmal atrial fibrillation: Secondary | ICD-10-CM | POA: Diagnosis not present

## 2016-01-07 LAB — POCT INR: INR: 2.6

## 2016-01-21 DIAGNOSIS — J302 Other seasonal allergic rhinitis: Secondary | ICD-10-CM | POA: Diagnosis not present

## 2016-01-21 DIAGNOSIS — J020 Streptococcal pharyngitis: Secondary | ICD-10-CM | POA: Diagnosis not present

## 2016-02-02 ENCOUNTER — Ambulatory Visit (INDEPENDENT_AMBULATORY_CARE_PROVIDER_SITE_OTHER): Payer: Medicare Other | Admitting: Pharmacist

## 2016-02-02 DIAGNOSIS — I480 Paroxysmal atrial fibrillation: Secondary | ICD-10-CM | POA: Diagnosis not present

## 2016-02-02 DIAGNOSIS — Z5181 Encounter for therapeutic drug level monitoring: Secondary | ICD-10-CM

## 2016-02-02 DIAGNOSIS — I4891 Unspecified atrial fibrillation: Secondary | ICD-10-CM | POA: Diagnosis not present

## 2016-02-02 DIAGNOSIS — Z7901 Long term (current) use of anticoagulants: Secondary | ICD-10-CM

## 2016-02-02 DIAGNOSIS — I2510 Atherosclerotic heart disease of native coronary artery without angina pectoris: Secondary | ICD-10-CM

## 2016-02-02 LAB — POCT INR: INR: 2.7

## 2016-02-08 DIAGNOSIS — H353211 Exudative age-related macular degeneration, right eye, with active choroidal neovascularization: Secondary | ICD-10-CM | POA: Diagnosis not present

## 2016-02-14 DIAGNOSIS — I10 Essential (primary) hypertension: Secondary | ICD-10-CM | POA: Diagnosis not present

## 2016-02-14 DIAGNOSIS — G309 Alzheimer's disease, unspecified: Secondary | ICD-10-CM | POA: Diagnosis not present

## 2016-02-14 DIAGNOSIS — Z7901 Long term (current) use of anticoagulants: Secondary | ICD-10-CM | POA: Diagnosis not present

## 2016-02-14 DIAGNOSIS — Z79899 Other long term (current) drug therapy: Secondary | ICD-10-CM | POA: Diagnosis not present

## 2016-02-14 DIAGNOSIS — I4891 Unspecified atrial fibrillation: Secondary | ICD-10-CM | POA: Diagnosis not present

## 2016-02-21 DIAGNOSIS — J029 Acute pharyngitis, unspecified: Secondary | ICD-10-CM | POA: Diagnosis not present

## 2016-03-17 ENCOUNTER — Ambulatory Visit (INDEPENDENT_AMBULATORY_CARE_PROVIDER_SITE_OTHER): Payer: Medicare Other | Admitting: *Deleted

## 2016-03-17 DIAGNOSIS — Z5181 Encounter for therapeutic drug level monitoring: Secondary | ICD-10-CM | POA: Diagnosis not present

## 2016-03-17 DIAGNOSIS — I480 Paroxysmal atrial fibrillation: Secondary | ICD-10-CM

## 2016-03-17 LAB — POCT INR: INR: 1.4

## 2016-03-18 DIAGNOSIS — J069 Acute upper respiratory infection, unspecified: Secondary | ICD-10-CM | POA: Diagnosis not present

## 2016-03-18 DIAGNOSIS — R05 Cough: Secondary | ICD-10-CM | POA: Diagnosis not present

## 2016-03-20 DIAGNOSIS — F0280 Dementia in other diseases classified elsewhere without behavioral disturbance: Secondary | ICD-10-CM | POA: Diagnosis not present

## 2016-03-20 DIAGNOSIS — G309 Alzheimer's disease, unspecified: Secondary | ICD-10-CM | POA: Diagnosis not present

## 2016-03-20 DIAGNOSIS — R413 Other amnesia: Secondary | ICD-10-CM | POA: Diagnosis not present

## 2016-03-21 ENCOUNTER — Other Ambulatory Visit: Payer: Self-pay | Admitting: Cardiovascular Disease

## 2016-03-27 ENCOUNTER — Ambulatory Visit (INDEPENDENT_AMBULATORY_CARE_PROVIDER_SITE_OTHER): Payer: Medicare Other | Admitting: Pharmacist

## 2016-03-27 DIAGNOSIS — I480 Paroxysmal atrial fibrillation: Secondary | ICD-10-CM | POA: Diagnosis not present

## 2016-03-27 DIAGNOSIS — Z5181 Encounter for therapeutic drug level monitoring: Secondary | ICD-10-CM | POA: Diagnosis not present

## 2016-03-27 LAB — POCT INR: INR: 1.4

## 2016-04-04 DIAGNOSIS — H353211 Exudative age-related macular degeneration, right eye, with active choroidal neovascularization: Secondary | ICD-10-CM | POA: Diagnosis not present

## 2016-04-05 DIAGNOSIS — J310 Chronic rhinitis: Secondary | ICD-10-CM | POA: Diagnosis not present

## 2016-04-05 DIAGNOSIS — E46 Unspecified protein-calorie malnutrition: Secondary | ICD-10-CM | POA: Diagnosis not present

## 2016-04-05 DIAGNOSIS — I482 Chronic atrial fibrillation: Secondary | ICD-10-CM | POA: Diagnosis not present

## 2016-04-05 DIAGNOSIS — Z79899 Other long term (current) drug therapy: Secondary | ICD-10-CM | POA: Diagnosis not present

## 2016-04-05 DIAGNOSIS — I10 Essential (primary) hypertension: Secondary | ICD-10-CM | POA: Diagnosis not present

## 2016-04-05 DIAGNOSIS — G301 Alzheimer's disease with late onset: Secondary | ICD-10-CM | POA: Diagnosis not present

## 2016-04-06 ENCOUNTER — Ambulatory Visit (INDEPENDENT_AMBULATORY_CARE_PROVIDER_SITE_OTHER): Payer: Medicare Other | Admitting: *Deleted

## 2016-04-06 DIAGNOSIS — Z5181 Encounter for therapeutic drug level monitoring: Secondary | ICD-10-CM | POA: Diagnosis not present

## 2016-04-06 DIAGNOSIS — I480 Paroxysmal atrial fibrillation: Secondary | ICD-10-CM

## 2016-04-06 LAB — POCT INR: INR: 2.7

## 2016-04-13 ENCOUNTER — Ambulatory Visit: Payer: Medicare Other | Admitting: Adult Health

## 2016-04-13 DIAGNOSIS — Z961 Presence of intraocular lens: Secondary | ICD-10-CM | POA: Diagnosis not present

## 2016-04-13 DIAGNOSIS — H353211 Exudative age-related macular degeneration, right eye, with active choroidal neovascularization: Secondary | ICD-10-CM | POA: Diagnosis not present

## 2016-04-13 DIAGNOSIS — H353122 Nonexudative age-related macular degeneration, left eye, intermediate dry stage: Secondary | ICD-10-CM | POA: Diagnosis not present

## 2016-04-13 DIAGNOSIS — H524 Presbyopia: Secondary | ICD-10-CM | POA: Diagnosis not present

## 2016-04-20 ENCOUNTER — Ambulatory Visit (INDEPENDENT_AMBULATORY_CARE_PROVIDER_SITE_OTHER): Payer: Medicare Other | Admitting: *Deleted

## 2016-04-20 DIAGNOSIS — Z5181 Encounter for therapeutic drug level monitoring: Secondary | ICD-10-CM | POA: Diagnosis not present

## 2016-04-20 DIAGNOSIS — I480 Paroxysmal atrial fibrillation: Secondary | ICD-10-CM

## 2016-04-20 LAB — POCT INR: INR: 2

## 2016-04-25 ENCOUNTER — Encounter: Payer: Self-pay | Admitting: Adult Health

## 2016-04-25 ENCOUNTER — Ambulatory Visit (INDEPENDENT_AMBULATORY_CARE_PROVIDER_SITE_OTHER): Payer: Medicare Other | Admitting: Adult Health

## 2016-04-25 DIAGNOSIS — G309 Alzheimer's disease, unspecified: Secondary | ICD-10-CM

## 2016-04-25 DIAGNOSIS — F0280 Dementia in other diseases classified elsewhere without behavioral disturbance: Secondary | ICD-10-CM

## 2016-04-25 MED ORDER — MEMANTINE HCL-DONEPEZIL HCL ER 28-10 MG PO CP24: 1.0000 | ORAL_CAPSULE | Freq: Every morning | ORAL | 11 refills | Status: DC

## 2016-04-25 NOTE — Patient Instructions (Signed)
Continue Namzaric  Memory score is slightly declined If your symptoms worsen or you develop new symptoms please let us know.

## 2016-04-25 NOTE — Progress Notes (Signed)
PATIENT: [name] DOB: 1933/05/26  REASON FOR VISIT: follow up- memory disturbance HISTORY FROM: patient  HISTORY OF PRESENT ILLNESS: Today 04/25/2016: [name] is an 83 year old female with a history of progressive memory loss. She returns today for follow-up. She is accompanied today by her husband. She reports that on a scale of 1-100 her memory is approximately 70%. She is able to complete all ADLs independently. Her and her husband currently reside at friends home [place]. She reports that they do cook breakfast and lunch together. She denies any trouble preparing meals. Her husband handles the finances and has always done this. She states that she sleeps fairly well. Denies hallucinations. Denies any change in mood or behavior. Reports that she has a good appetite. She states that she tends to forget things such as the appointment time and often has to recheck the this several times to reminder her. Her husband reports that typically in the morning she has to reorient herself. He reports that sometimes this can "take a while." He reports that she is also very active at friend's home. He feels that she is sometimes too active and should take more time for herself. The patient and her husband has been reading up about the [place] Protocol (I am not familiar with this.) The patient's husband is requesting that I read about this and give them my opinion. Patient returns today for an evaluation.  HISTORY 10/12/15 per Dr. Edwena Stone notes: [name] is a 83 y.o. female , a retired Holiday representative , and seen here as a referral  from Dr. Felipa Stone for progressive memory loss, primarily anomia.   The [name] are her to transfer her care from Dr [name], based on her desire to see a female provider. She has been followed over several visits by Dr. 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 The patient has a history of paroxysmal atrial fibrillation and is on chronic anticoagulation, hypertension, coronary artery disease, polymyalgia rheumatica, depression and irritable bowel. She has meanwhile advanced from Aricept 5 mg to 10 mg daily and takes Namenda 10 mg twice daily she reports that she has more and more difficulties with short-term memory, is often very insecure about the date, about upcoming appointments, and still also confirms that she has trouble finding words sometimes midsentence. She doesn't hesitate on the phone with friends or  her adult children.  At church , she cannot longer name her co-parrisheners. She has also noticed that her sleep is now more fragmented she seems to frequently wake up between 2 and 3 AM which was not the case in 2015. She sometimes taking melatonin then after arousing from sleep. We will discuss this today as well I would also like to her [place] cognitive assessment scores. On 11/21/2013 21 points on 06/16/2014 23 points on April 13 this year 16 points. She continues to Yoga twice weekly. She finds herself in a peculiar situation at the book club, as she forgets the books contents. She still plays piano.  We will also help to establish a bedtime routine, some comforting rituals.    REVIEW OF SYSTEMS: Out of a complete 14 system review of symptoms, the patient complains only of the following symptoms, and all other reviewed systems are negative.  See history of present illness  ALLERGIES: No Known Allergies  HOME MEDICATIONS: Outpatient Medications Prior to Visit  Medication Sig Dispense Refill  . lisinopril (PRINIVIL,ZESTRIL) 5 MG tablet Take 5 mg by mouth daily.     . Melatonin 1 MG TABS Take 1 tablet by mouth at bedtime.     . metoprolol tartrate (LOPRESSOR) 25 MG tablet Take 25 mg by mouth 2 (two) times daily.    [name] NAMZARIC 28-10 MG CP24 TAKE ONE CAPSULE BY MOUTH  EVERY MORNING 30 capsule 0  . warfarin (COUMADIN) 5 MG tablet TAKE TABLETS BY MOUTH AS DIRECTED BY ANTICOAGULATION CLINIC 120 tablet 1  . Memantine HCl-Donepezil HCl (NAMZARIC) 28-10 MG CP24 Take 1 capsule by mouth every evening. 30 capsule 5   No facility-administered medications prior to visit.     PAST MEDICAL HISTORY: Past Medical History:  Diagnosis Date  . Anxiety   . Atrophic vaginitis   . CAD (coronary artery disease) 2008/2010   Catheterization 2008, 40% LAD after first diagonal  /  nuclear January, 2010 normal  . Carotid artery disease ([place])    Doppler, July, 2011, 0-39% bilateral  . Decreased platelet count ([place]) 04/2011   142,000  . Endometrial polyp   . GERD (gastroesophageal reflux disease)   . Hyperlipidemia   . Hypertension   . Irritable bowel syndrome with constipation   . Memory difficulty 01-20-13   "memory issues"  . Normal nuclear stress test Feb 2013   No ischemia. Normal wall motion. EF greater than 70%  . Osteoarthritis   . Osteopenia   . Osteoporosis   . Palpitations    Monitor, May, 2013, no atrial fib,  one short run of PSVT  . Paroxysmal atrial fibrillation (HCC)    Coumadin therapy  . Polymyalgia rheumatica ([place])   . Spinal stenosis   . Stress   . Vitamin D deficiency     PAST SURGICAL HISTORY: Past Surgical History:  Procedure Laterality Date  . BREAST SURGERY     BENIGN BREAST LUMP  . CATARACT SURGERY  2009  . COLONOSCOPY WITH PROPOFOL N/A 02/11/2013   Procedure: COLONOSCOPY WITH PROPOFOL;  Surgeon: [name], MD;  Location: [facility];  Service: Endoscopy;  Laterality: N/A;  . DILATION AND CURETTAGE OF UTERUS    . EXCISION MORTON'S NEUROMA  2000  . HIP SURGERY Right 2005   debridement  . HYSTEROSCOPY  02/07/2005   HYST W D&C, DX: PMB W SMALL ENDO POLYP-SURGEON [name]  . LAMINECTOMY  March 2009   L4 through L5  . TONSILLECTOMY      FAMILY HISTORY: Family History  Problem Relation Age of Onset  . Transient ischemic  attack Mother   . Stroke Mother   . Cancer Father     PROSTATE  . Pancreatitis Father   . Stroke Sister   . Spina bifida Brother   . Hypertension Brother   . Heart disease Brother   . Hypertension Sister   . Diabetes Sister   . Heart disease Sister   . Dementia Sister     SOCIAL HISTORY: Social History   Social History  . Marital status: Married    Spouse name: Jeneen Rinks  . Number of children: 2  . Years of education: Masters   Occupational History  . Retired Retired   Social History Main Topics  . Smoking status: Never Smoker  . Smokeless tobacco: Never Used  . Alcohol use No  . Drug use: No  . Sexual activity: No   Other Topics Concern  . Not on file  Social History Narrative   Patient is married Jeneen Rinks) and lives at home with her husband.   Patient has two children.   Retired; Married;   Patient is right-handed.   Patient drinks very little caffeine.   Patient has a Scientist, water quality.   Has had increased stress as her husband had a stroke ~ 3wks ago and she has been taking care of him. (04/07/11)      PHYSICAL EXAM  Vitals:   04/25/16 0815  BP: (!) 141/54  Pulse: (!) 46  Weight: 99 lb 3.2 oz (45 kg)  Height: 5' 1.5" (1.562 m)   Body mass index is 18.44 kg/m.  MMSE - Mini Mental State Exam 04/25/2016 10/12/2015 07/12/2015  Orientation to time 2 5 4   Orientation to Place 2 4 4   Registration 3 3 3   Attention/ Calculation 4 5 3   Recall 0 1 0  Language- name 2 objects 2 2 2   Language- repeat 1 0 1  Language- follow 3 step command 3 3 3   Language- read & follow direction 1 1 1   Write a sentence 1 1 1   Copy design 1 0 1  Total score 20 25 23      Generalized: Well developed, in no acute distress   Neurological examination  Mentation: Alert oriented to time, place, history taking. Follows all commands speech and language fluent Cranial nerve II-XII: Pupils were equal round reactive to light. Extraocular movements were full, visual field were full on  confrontational test. Facial sensation and strength were normal. Uvula tongue midline. Head turning and shoulder shrug  were normal and symmetric. Motor: The motor testing reveals 5 over 5 strength of all 4 extremities. Good symmetric motor tone is noted throughout.  Sensory: Sensory testing is intact to soft touch on all 4 extremities. No evidence of extinction is noted.  Coordination: Cerebellar testing reveals good finger-nose-finger and heel-to-shin bilaterally.  Gait and station: Gait is normal. Tandem gait is normal. Romberg is negative. No drift is seen.  Reflexes: Deep tendon reflexes are symmetric and normal bilaterally.   DIAGNOSTIC DATA (LABS, IMAGING, TESTING) - I reviewed patient records, labs, notes, testing and imaging myself where available.  Lab Results  Component Value Date   WBC 3.9 (L) 06/05/2015   HGB 13.4 06/05/2015   HCT 40.7 06/05/2015   MCV 95.1 06/05/2015   PLT 130 (L) 06/05/2015      Component Value Date/Time   NA 139 06/05/2015 0854   K 3.9 06/05/2015 0854   CL 103 06/05/2015 0854   CO2 27 06/05/2015 0854   GLUCOSE 158 (H) 06/05/2015 0854   BUN 9 06/05/2015 0854   CREATININE 0.80 06/05/2015 0854   CALCIUM 8.9 06/05/2015 0854   PROT 5.9 (L) 11/23/2009 0856   ALBUMIN 4.0 11/23/2009 0856   AST 22 11/23/2009 0856   ALT 17 11/23/2009 0856   ALKPHOS 34 (L) 11/23/2009 0856   BILITOT 0.9 11/23/2009 0856   GFRNONAA >60 06/05/2015 0854   GFRAA >60 06/05/2015 0854   Lab Results  Component Value Date   CHOL 201 (H) 06/22/2015   HDL 63 06/22/2015   LDLCALC 125 06/22/2015   LDLDIRECT 119.7 08/26/2009   TRIG 66 06/22/2015   CHOLHDL 3.2 06/22/2015    Lab Results  Component Value Date   TSH 2.850 07/03/2013      ASSESSMENT AND PLAN 83 y.o. year old female  has a past medical history of Anxiety; Atrophic vaginitis; CAD (coronary artery disease) (2008/2010); Carotid artery disease ([place]); Decreased platelet  count ([place]) (04/2011); Endometrial polyp; GERD  (gastroesophageal reflux disease); Hyperlipidemia; Hypertension; Irritable bowel syndrome with constipation; Memory difficulty (01-20-13); Normal nuclear stress test (Feb 2013); Osteoarthritis; Osteopenia; Osteoporosis; Palpitations; Paroxysmal atrial fibrillation ([place]); Polymyalgia rheumatica ([place]); Spinal stenosis; Stress; and Vitamin D deficiency. here with:  1. Dementia  The patient's memory score has slightly declined. She will continue on Namzaric. I advised the patient and her husband and I will read over the [facility] protocol. They will call back in 1 month to discuss. Advised that if her symptoms worsen or she develops new symptoms she should let us know. Will follow-up in 6 months or sooner if needed.  I spent 15 minutes with the patient 50% this time was spent reviewing her memory score and treatment options.    Ward Givens, MSN, NP-C 04/25/2016, 8:41 AM [facility] [address], [place], [place] [address] [phone]

## 2016-05-11 ENCOUNTER — Ambulatory Visit (INDEPENDENT_AMBULATORY_CARE_PROVIDER_SITE_OTHER): Payer: Medicare Other | Admitting: *Deleted

## 2016-05-11 DIAGNOSIS — Z5181 Encounter for therapeutic drug level monitoring: Secondary | ICD-10-CM

## 2016-05-11 DIAGNOSIS — I480 Paroxysmal atrial fibrillation: Secondary | ICD-10-CM | POA: Diagnosis not present

## 2016-05-11 LAB — POCT INR: INR: 1.5

## 2016-05-15 DIAGNOSIS — Z7901 Long term (current) use of anticoagulants: Secondary | ICD-10-CM | POA: Diagnosis not present

## 2016-05-15 DIAGNOSIS — I10 Essential (primary) hypertension: Secondary | ICD-10-CM | POA: Diagnosis not present

## 2016-05-15 DIAGNOSIS — E785 Hyperlipidemia, unspecified: Secondary | ICD-10-CM | POA: Diagnosis not present

## 2016-05-15 DIAGNOSIS — I480 Paroxysmal atrial fibrillation: Secondary | ICD-10-CM | POA: Diagnosis not present

## 2016-05-15 DIAGNOSIS — G309 Alzheimer's disease, unspecified: Secondary | ICD-10-CM | POA: Diagnosis not present

## 2016-05-18 DIAGNOSIS — H353211 Exudative age-related macular degeneration, right eye, with active choroidal neovascularization: Secondary | ICD-10-CM | POA: Diagnosis not present

## 2016-05-19 ENCOUNTER — Encounter (INDEPENDENT_AMBULATORY_CARE_PROVIDER_SITE_OTHER): Payer: Self-pay

## 2016-05-19 ENCOUNTER — Ambulatory Visit (INDEPENDENT_AMBULATORY_CARE_PROVIDER_SITE_OTHER): Payer: Medicare Other

## 2016-05-19 DIAGNOSIS — I480 Paroxysmal atrial fibrillation: Secondary | ICD-10-CM | POA: Diagnosis not present

## 2016-05-19 DIAGNOSIS — Z5181 Encounter for therapeutic drug level monitoring: Secondary | ICD-10-CM | POA: Diagnosis not present

## 2016-05-19 LAB — POCT INR: INR: 1.9

## 2016-06-02 ENCOUNTER — Ambulatory Visit (INDEPENDENT_AMBULATORY_CARE_PROVIDER_SITE_OTHER): Payer: Medicare Other | Admitting: *Deleted

## 2016-06-02 DIAGNOSIS — Z5181 Encounter for therapeutic drug level monitoring: Secondary | ICD-10-CM

## 2016-06-02 DIAGNOSIS — I480 Paroxysmal atrial fibrillation: Secondary | ICD-10-CM | POA: Diagnosis not present

## 2016-06-02 LAB — POCT INR: INR: 1.4

## 2016-06-05 DIAGNOSIS — J029 Acute pharyngitis, unspecified: Secondary | ICD-10-CM | POA: Diagnosis not present

## 2016-06-05 DIAGNOSIS — R0982 Postnasal drip: Secondary | ICD-10-CM | POA: Diagnosis not present

## 2016-06-09 ENCOUNTER — Ambulatory Visit (INDEPENDENT_AMBULATORY_CARE_PROVIDER_SITE_OTHER): Payer: Medicare Other | Admitting: *Deleted

## 2016-06-09 DIAGNOSIS — Z5181 Encounter for therapeutic drug level monitoring: Secondary | ICD-10-CM | POA: Diagnosis not present

## 2016-06-09 DIAGNOSIS — I480 Paroxysmal atrial fibrillation: Secondary | ICD-10-CM

## 2016-06-09 LAB — POCT INR: INR: 1.4

## 2016-06-12 ENCOUNTER — Encounter: Payer: Self-pay | Admitting: Cardiovascular Disease

## 2016-06-13 ENCOUNTER — Ambulatory Visit (INDEPENDENT_AMBULATORY_CARE_PROVIDER_SITE_OTHER): Payer: Medicare Other | Admitting: *Deleted

## 2016-06-13 DIAGNOSIS — I480 Paroxysmal atrial fibrillation: Secondary | ICD-10-CM

## 2016-06-13 DIAGNOSIS — Z5181 Encounter for therapeutic drug level monitoring: Secondary | ICD-10-CM

## 2016-06-13 DIAGNOSIS — I2510 Atherosclerotic heart disease of native coronary artery without angina pectoris: Secondary | ICD-10-CM

## 2016-06-13 LAB — POCT INR: INR: 1.6

## 2016-06-24 DIAGNOSIS — J3089 Other allergic rhinitis: Secondary | ICD-10-CM | POA: Diagnosis not present

## 2016-06-24 DIAGNOSIS — B370 Candidal stomatitis: Secondary | ICD-10-CM | POA: Diagnosis not present

## 2016-06-27 ENCOUNTER — Ambulatory Visit (INDEPENDENT_AMBULATORY_CARE_PROVIDER_SITE_OTHER): Payer: Medicare Other | Admitting: Cardiovascular Disease

## 2016-06-27 ENCOUNTER — Ambulatory Visit (INDEPENDENT_AMBULATORY_CARE_PROVIDER_SITE_OTHER): Payer: Medicare Other | Admitting: *Deleted

## 2016-06-27 ENCOUNTER — Encounter: Payer: Self-pay | Admitting: Cardiovascular Disease

## 2016-06-27 VITALS — BP 150/60 | HR 46 | Ht 62.0 in | Wt 99.0 lb

## 2016-06-27 DIAGNOSIS — I480 Paroxysmal atrial fibrillation: Secondary | ICD-10-CM

## 2016-06-27 DIAGNOSIS — Z5181 Encounter for therapeutic drug level monitoring: Secondary | ICD-10-CM

## 2016-06-27 DIAGNOSIS — I2510 Atherosclerotic heart disease of native coronary artery without angina pectoris: Secondary | ICD-10-CM | POA: Diagnosis not present

## 2016-06-27 LAB — BASIC METABOLIC PANEL
BUN/Creatinine Ratio: 21 (ref 12–28)
BUN: 17 mg/dL (ref 8–27)
CO2: 28 mmol/L (ref 18–29)
Calcium: 9.5 mg/dL (ref 8.7–10.3)
Chloride: 103 mmol/L (ref 96–106)
Creatinine, Ser: 0.81 mg/dL (ref 0.57–1.00)
GFR calc Af Amer: 79 mL/min/{1.73_m2} (ref >59)
GFR calc non Af Amer: 68 mL/min/{1.73_m2} (ref >59)
Glucose: 72 mg/dL (ref 65–99)
Potassium: 4.1 mmol/L (ref 3.5–5.2)
Sodium: 144 mmol/L (ref 134–144)

## 2016-06-27 LAB — HEPATIC FUNCTION PANEL
ALK PHOS: 55 IU/L (ref 39–117)
ALT: 35 IU/L — AB (ref 0–32)
AST: 31 IU/L (ref 0–40)
Albumin: 4 g/dL (ref 3.5–4.7)
BILIRUBIN, DIRECT: 0.14 mg/dL (ref 0.00–0.40)
Bilirubin Total: 0.5 mg/dL (ref 0.0–1.2)
Total Protein: 5.8 g/dL — ABNORMAL LOW (ref 6.0–8.5)

## 2016-06-27 LAB — LIPID PANEL
Chol/HDL Ratio: 2.5 ratio (ref 0.0–4.4)
Cholesterol, Total: 187 mg/dL (ref 100–199)
HDL: 75 mg/dL (ref >39)
LDL Calculated: 94 mg/dL (ref 0–99)
Triglycerides: 91 mg/dL (ref 0–149)
VLDL Cholesterol Cal: 18 mg/dL (ref 5–40)

## 2016-06-27 LAB — POCT INR: INR: 1.8

## 2016-06-27 NOTE — Progress Notes (Signed)
Cardiology Office Note   Date:  06/27/2016   ID:  [name], DOB [birth year], MRN [id number]  PCP:  Mathews Argyle, MD  Cardiologist:   Mertie Moores, MD ( transitioning from Dr. Ron Parker)   Chief Complaint  Patient presents with  . Coronary Artery Disease  . Hyperlipidemia  . Atrial Fibrillation   Problem list 1. Mild coronary artery disease 2. Mild carotid artery disease 3. Paroxysmal atrial fibrillation-on Coumadin 4. Essential hypertension 5. Hyperlipidemia    [name] is a 83 y.o. female who presents to establish care. She is a previous patient of Dr. Ron Parker. She has a history of mild coronary artery disease, carotid artery disease. She admits to having some memory problems. She has paroxysmal atrial fibrillation and is on Coumadin.     HR is slow,  she denies having any syncope or presyncope. She does all of her typical household chores without any difficulty . She exercises several times a week.  June 22, 2015:  [name] is doing well.  Told me that she has some dementia.   June 27, 2016:  Seen back today for PAF, CAD  and palpitations  Exercising regularly  Has moved into [facility]    Past Medical History:  Diagnosis Date  . Anxiety   . Atrophic vaginitis   . CAD (coronary artery disease) 2008/2010   Catheterization 2008, 40% LAD after first diagonal  /  nuclear January, 2010 normal  . Carotid artery disease ([place])    Doppler, July, 2011, 0-39% bilateral  . Decreased platelet count ([place]) 04/2011   142,000  . Endometrial polyp   . GERD (gastroesophageal reflux disease)   . Hyperlipidemia   . Hypertension   . Irritable bowel syndrome with constipation   . Memory difficulty 01-20-13   "memory issues"  . Normal nuclear stress test Feb 2013   No ischemia. Normal wall motion. EF greater than 70%  . Osteoarthritis   . Osteopenia   . Osteoporosis   . Palpitations    Monitor, May, 2013, no atrial fib,  one short run of PSVT  .  Paroxysmal atrial fibrillation (HCC)    Coumadin therapy  . Polymyalgia rheumatica ([place])   . Spinal stenosis   . Stress   . Vitamin D deficiency     Past Surgical History:  Procedure Laterality Date  . BREAST SURGERY     BENIGN BREAST LUMP  . CATARACT SURGERY  2009  . COLONOSCOPY WITH PROPOFOL N/A 02/11/2013   Procedure: COLONOSCOPY WITH PROPOFOL;  Surgeon: [name], MD;  Location: [facility];  Service: Endoscopy;  Laterality: N/A;  . DILATION AND CURETTAGE OF UTERUS    . EXCISION MORTON'S NEUROMA  2000  . HIP SURGERY Right 2005   debridement  . HYSTEROSCOPY  02/07/2005   HYST W D&C, DX: PMB W SMALL ENDO POLYP-SURGEON [name]  . LAMINECTOMY  March 2009   L4 through L5  . TONSILLECTOMY       Current Outpatient Prescriptions  Medication Sig Dispense Refill  . lisinopril (PRINIVIL,ZESTRIL) 5 MG tablet Take 5 mg by mouth daily.     . Melatonin 1 MG TABS Take 1 tablet by mouth at bedtime.     . Memantine HCl-Donepezil HCl (NAMZARIC) 28-10 MG CP24 Take 1 capsule by mouth every morning. 30 capsule 11  . warfarin (COUMADIN) 5 MG tablet TAKE TABLETS BY MOUTH AS DIRECTED BY ANTICOAGULATION CLINIC 120 tablet 1   No current facility-administered medications for this visit.  Allergies:   Patient has no known allergies.    Social History:  The patient  reports that she has never smoked. She has never used smokeless tobacco. She reports that she does not drink alcohol or use drugs.   Family History:  The patient's family history includes Cancer in her father; Dementia in her sister; Diabetes in her sister; Heart disease in her brother and sister; Hypertension in her brother and sister; Pancreatitis in her father; Spina bifida in her brother; Stroke in her mother and sister; Transient ischemic attack in her mother.    ROS:  Please see the history of present illness.    Review of Systems: Constitutional:  denies fever, chills, diaphoresis, appetite change and fatigue.    HEENT: denies photophobia, eye pain, redness, hearing loss, ear pain, congestion, sore throat, rhinorrhea, sneezing, neck pain, neck stiffness and tinnitus.  Respiratory: denies SOB, DOE, cough, chest tightness, and wheezing.  Cardiovascular: denies chest pain, palpitations and leg swelling.  Gastrointestinal: denies nausea, vomiting, abdominal pain, diarrhea, constipation, blood in stool.  Genitourinary: denies dysuria, urgency, frequency, hematuria, flank pain and difficulty urinating.  Musculoskeletal: denies  myalgias, back pain, joint swelling, arthralgias and gait problem.   Skin: denies pallor, rash and wound.  Neurological: denies dizziness, seizures, syncope, weakness, light-headedness, numbness and headaches.   Hematological: denies adenopathy, easy bruising, personal or family bleeding history.  Psychiatric/ Behavioral: denies suicidal ideation, mood changes, confusion, nervousness, sleep disturbance and agitation.       All other systems are reviewed and negative.    PHYSICAL EXAM: VS:  BP (!) 150/60   Pulse (!) 46   Ht 5\' 2"  (1.575 m)   Wt 99 lb (44.9 kg)   BMI 18.11 kg/m  , BMI Body mass index is 18.11 kg/m. GEN: Well nourished, well developed, in no acute distress  HEENT: normal  Neck: no JVD, carotid bruits, or masses Cardiac: RRR;     bradycardia .  no murmurs, rubs, or gallops,no edema  Respiratory:  clear to auscultation bilaterally, normal work of breathing GI: soft, nontender, nondistended, + BS MS: no deformity or atrophy  Skin: warm and dry, no rash Neuro:  Strength and sensation are intact Psych: normal   EKG:  EKG is ordered today. ECG June 27, 2016:   Sinus brady at 43.       Recent Labs: No results found for requested labs within last 8760 hours.    Lipid Panel    Component Value Date/Time   CHOL 201 (H) 06/22/2015 0825   TRIG 66 06/22/2015 0825   HDL 63 06/22/2015 0825   CHOLHDL 3.2 06/22/2015 0825   VLDL 13 06/22/2015 0825   LDLCALC  125 06/22/2015 0825   LDLDIRECT 119.7 08/26/2009 1007      Wt Readings from Last 3 Encounters:  06/27/16 99 lb (44.9 kg)  04/25/16 99 lb 3.2 oz (45 kg)  10/12/15 98 lb 8 oz (44.7 kg)      Other studies Reviewed: Additional studies/ records that were reviewed today include: . Review of the above records demonstrates:    ASSESSMENT AND PLAN:  1. Mild coronary artery disease: [name] is doing well. She has not had any episodes of chest pain 2. Mild carotid artery disease: She's not had any signs or symptoms of stroke 3. Paroxysmal atrial fibrillation: Her heart rate is very regular but she is bradycardic.  Will DC metoprolol  . We will continue with the Coumadin. I'll see her again in 1 year for follow-up visit  4. Essential hypertension: Her blood pressures is well-controlled. Continue lisinopril     Current medicines are reviewed at length with the patient today.  The patient does not have concerns regarding medicines.  The following changes have been made:  no change  Labs/ tests ordered today include:   Orders Placed This Encounter  Procedures  . Basic metabolic panel  . Hepatic function panel  . Lipid panel  . EKG 12-Lead     Disposition:   FU with me in 1 year     Mertie Moores, MD  06/27/2016 11:01 AM    [address], [place], [place]  [address] Phone: [phone]; Fax: [phone]

## 2016-06-27 NOTE — Patient Instructions (Signed)
Medication Instructions:   STOP METOPROLOL  Labwork:  Your physician recommends that you HAVE LAB WORK TODAY  Follow-Up:  Your physician wants you to follow-up in: [place] will receive a reminder letter in the mail two months in advance. If you don't receive a letter, please call our office to schedule the follow-up appointment.   If you need a refill on your cardiac medications before your next appointment, please call your pharmacy.

## 2016-07-07 ENCOUNTER — Telehealth: Payer: Self-pay | Admitting: *Deleted

## 2016-07-07 DIAGNOSIS — G309 Alzheimer's disease, unspecified: Principal | ICD-10-CM

## 2016-07-07 DIAGNOSIS — F0280 Dementia in other diseases classified elsewhere without behavioral disturbance: Secondary | ICD-10-CM

## 2016-07-07 NOTE — Telephone Encounter (Signed)
LMVM for husband to call me back, re: request for 90 day supply at this pharmacy.    Relayed close today at 1200.

## 2016-07-10 MED ORDER — MEMANTINE HCL-DONEPEZIL HCL ER 28-10 MG PO CP24: 1.0000 | ORAL_CAPSULE | Freq: Every morning | ORAL | 3 refills | Status: DC

## 2016-07-10 NOTE — Telephone Encounter (Signed)
Patients husband called in reference to Memantine HCl-Donepezil HCl (NAMZARIC) 28-10 MG CP24.  Per husband medication needs to be called into [facility] on [address].  Please call

## 2016-07-10 NOTE — Telephone Encounter (Signed)
Done, did to [facility] Herbie Drape, but spoke to April and cancelled and then redid to [facility].

## 2016-07-10 NOTE — Telephone Encounter (Signed)
Done at [facility]

## 2016-07-10 NOTE — Addendum Note (Signed)
Addended byOliver Hum on: 07/10/2016 03:17 PM   Modules accepted: Orders

## 2016-07-11 ENCOUNTER — Encounter (INDEPENDENT_AMBULATORY_CARE_PROVIDER_SITE_OTHER): Payer: Self-pay

## 2016-07-11 ENCOUNTER — Ambulatory Visit (INDEPENDENT_AMBULATORY_CARE_PROVIDER_SITE_OTHER): Payer: Medicare Other

## 2016-07-11 DIAGNOSIS — I2510 Atherosclerotic heart disease of native coronary artery without angina pectoris: Secondary | ICD-10-CM

## 2016-07-11 DIAGNOSIS — I480 Paroxysmal atrial fibrillation: Secondary | ICD-10-CM

## 2016-07-11 DIAGNOSIS — Z5181 Encounter for therapeutic drug level monitoring: Secondary | ICD-10-CM | POA: Diagnosis not present

## 2016-07-11 LAB — POCT INR: INR: 2.4

## 2016-07-13 DIAGNOSIS — H353221 Exudative age-related macular degeneration, left eye, with active choroidal neovascularization: Secondary | ICD-10-CM | POA: Diagnosis not present

## 2016-07-19 ENCOUNTER — Other Ambulatory Visit: Payer: Self-pay | Admitting: Nurse Practitioner

## 2016-07-19 ENCOUNTER — Ambulatory Visit (INDEPENDENT_AMBULATORY_CARE_PROVIDER_SITE_OTHER): Payer: Medicare Other | Admitting: Nurse Practitioner

## 2016-07-19 ENCOUNTER — Encounter (INDEPENDENT_AMBULATORY_CARE_PROVIDER_SITE_OTHER): Payer: Self-pay

## 2016-07-19 ENCOUNTER — Telehealth: Payer: Self-pay | Admitting: Cardiovascular Disease

## 2016-07-19 VITALS — BP 120/70 | HR 90 | Ht 62.0 in | Wt 96.3 lb

## 2016-07-19 DIAGNOSIS — R42 Dizziness and giddiness: Secondary | ICD-10-CM | POA: Diagnosis not present

## 2016-07-19 DIAGNOSIS — I480 Paroxysmal atrial fibrillation: Secondary | ICD-10-CM

## 2016-07-19 MED ORDER — METOPROLOL TARTRATE 25 MG PO TABS: 12.5000 mg | ORAL_TABLET | Freq: Two times a day (BID) | ORAL | 11 refills | Status: DC

## 2016-07-19 NOTE — Patient Instructions (Signed)
Medication Instructions:  RESTART Metoprolol (Lopressor) 12.5 mg twice daily   Labwork: None Ordered   Testing/Procedures: None Ordered   Follow-Up: Your physician recommends that you schedule a follow-up appointment in: call back tomorrow or Friday to report how you are feeling   If you need a refill on your cardiac medications before your next appointment, please call your pharmacy.   Thank you for choosing [facility]! Christen Bame, RN [phone]

## 2016-07-19 NOTE — Telephone Encounter (Signed)
Call was transferred into triage.  Patient was in the shower around 8am and when getting out became dizzy, heart pounding.  She sat on the floor.  Her husband helped her into bed.  Her dizziness has improved but she still feels her heart beating hard.  She has never experienced this before.  States she doesn't usually know when she is in afib.  Her HR was in the 90s at the time according to her husband.  On recheck the BP was 112/67 and 86 just prior to the phone call.    Advised the patient I will review with MD and call her back.    Called patient back and advised to come for EKG this morning.  She is appreciative for the appointment and will be on her way.

## 2016-07-19 NOTE — Addendum Note (Signed)
Addended by: Emmaline Life on: 07/19/2016 04:52 PM   Modules accepted: Orders

## 2016-07-19 NOTE — Telephone Encounter (Signed)
Patient calling, states that she is having palpitations and is dizzy. Patient states that has AFIB and states that while she was in the shower she almost passed out. Patient started having palpitations today. Thanks.

## 2016-07-19 NOTE — Progress Notes (Signed)
1.) Reason for visit: complains of dizziness, especially this morning and irregular HR for the past 2-3 weeks  2.) Name of MD requesting visit: Dr. Acie Fredrickson  3.) H&P: Hx PAF; at last ov in April Metoprolol was stopped by Dr. Acie Fredrickson due to sinus bradycardia  4.) ROS related to problem: patient presents for EKG due to extreme dizziness this morning when getting out of the shower. She states she has been feeling her irregular HR for the past 2-3 weeks  5.) Assessment and plan per MD: Restart Metoprolol 12.5 mg twice daily  Patient may return to see Dr. Acie Fredrickson tomorrow  or Friday or may call back to let us know how she  is feeling

## 2016-07-26 ENCOUNTER — Telehealth: Payer: Self-pay | Admitting: Nurse Practitioner

## 2016-07-26 NOTE — Telephone Encounter (Signed)
Called patient to see how she is feeling since restarting metoprolol. She states HR and BP are normal and she is feeling well. I advised her to call back with questions or concerns. She thanked me for the call.

## 2016-08-02 ENCOUNTER — Ambulatory Visit (INDEPENDENT_AMBULATORY_CARE_PROVIDER_SITE_OTHER): Payer: Medicare Other | Admitting: *Deleted

## 2016-08-02 DIAGNOSIS — Z5181 Encounter for therapeutic drug level monitoring: Secondary | ICD-10-CM

## 2016-08-02 DIAGNOSIS — I2510 Atherosclerotic heart disease of native coronary artery without angina pectoris: Secondary | ICD-10-CM | POA: Diagnosis not present

## 2016-08-02 DIAGNOSIS — I480 Paroxysmal atrial fibrillation: Secondary | ICD-10-CM

## 2016-08-02 LAB — POCT INR: INR: 2.4

## 2016-08-04 DIAGNOSIS — I482 Chronic atrial fibrillation: Secondary | ICD-10-CM | POA: Diagnosis not present

## 2016-08-04 DIAGNOSIS — I10 Essential (primary) hypertension: Secondary | ICD-10-CM | POA: Diagnosis not present

## 2016-08-04 DIAGNOSIS — G301 Alzheimer's disease with late onset: Secondary | ICD-10-CM | POA: Diagnosis not present

## 2016-08-04 DIAGNOSIS — M810 Age-related osteoporosis without current pathological fracture: Secondary | ICD-10-CM | POA: Diagnosis not present

## 2016-08-30 ENCOUNTER — Ambulatory Visit (INDEPENDENT_AMBULATORY_CARE_PROVIDER_SITE_OTHER): Payer: Medicare Other | Admitting: *Deleted

## 2016-08-30 ENCOUNTER — Telehealth: Payer: Self-pay | Admitting: Adult Health

## 2016-08-30 DIAGNOSIS — I2510 Atherosclerotic heart disease of native coronary artery without angina pectoris: Secondary | ICD-10-CM | POA: Diagnosis not present

## 2016-08-30 DIAGNOSIS — I480 Paroxysmal atrial fibrillation: Secondary | ICD-10-CM | POA: Diagnosis not present

## 2016-08-30 DIAGNOSIS — Z5181 Encounter for therapeutic drug level monitoring: Secondary | ICD-10-CM

## 2016-08-30 LAB — POCT INR: INR: 1.4

## 2016-08-30 NOTE — Telephone Encounter (Signed)
Spoke to pt and relayed that will speak with MM/NP on Monday.  She verbalized understanding.

## 2016-08-30 NOTE — Telephone Encounter (Signed)
Pt's said the alzheimers is getting worse with date and time. She is wanting an appt to talk about if there is anything else she could be taking but she is open to a phone call. She is aware Jinny Blossom is out of the office until Monday.

## 2016-09-04 NOTE — Telephone Encounter (Signed)
If she feels that its getting worse. Please schedule an appointment

## 2016-09-05 NOTE — Telephone Encounter (Signed)
I spoke to pt and she expressed anxiety about not knowing what to expect. (what is going on with her), she felt no one was telling her.  She is taking the namzaric daily.  I gave her the [facility] website as a source.  She will look at this and write down questions as she has some.  I would look for a sooner appt in MM/NP but her next appt with Dr. Brett Fairy.  I will forward and see if a sooner appt is available for her.  She was appreciative.  10-24-16 is her 6 mon regular appt.

## 2016-09-06 DIAGNOSIS — H353211 Exudative age-related macular degeneration, right eye, with active choroidal neovascularization: Secondary | ICD-10-CM | POA: Diagnosis not present

## 2016-09-06 NOTE — Telephone Encounter (Addendum)
Left message for a return call to schedule an earlier appt with Dr. Brett Fairy or Jinny Blossom. Please schedule for next available time for either provider.

## 2016-09-06 NOTE — Telephone Encounter (Signed)
Dr. Brett Fairy has offered to see patient at 10am on 09/07/16.  She will arrive to our office at 9:30am for check-in.  Pt appreciative of quick appointment.

## 2016-09-07 ENCOUNTER — Ambulatory Visit (INDEPENDENT_AMBULATORY_CARE_PROVIDER_SITE_OTHER): Payer: Medicare Other | Admitting: Neurology

## 2016-09-07 ENCOUNTER — Encounter: Payer: Self-pay | Admitting: Neurology

## 2016-09-07 VITALS — BP 161/75 | HR 62 | Ht 62.0 in | Wt 96.0 lb

## 2016-09-07 DIAGNOSIS — I2510 Atherosclerotic heart disease of native coronary artery without angina pectoris: Secondary | ICD-10-CM | POA: Diagnosis not present

## 2016-09-07 DIAGNOSIS — G301 Alzheimer's disease with late onset: Secondary | ICD-10-CM

## 2016-09-07 DIAGNOSIS — F0280 Dementia in other diseases classified elsewhere without behavioral disturbance: Secondary | ICD-10-CM | POA: Diagnosis not present

## 2016-09-07 MED ORDER — SERTRALINE HCL 25 MG PO TABS: 25.0000 mg | ORAL_TABLET | Freq: Every day | ORAL | 5 refills | Status: DC

## 2016-09-07 NOTE — Progress Notes (Signed)
[facility]  Provider:  Larey Seat, M D  Referring Provider: Lajean Manes, MD Primary Care Physician:  Lajean Manes, MD  Chief Complaint  Patient presents with  . Follow-up    memory worsening    HPI: 09-07-2016 I have the pleasure of seeing [name] today again , who has noted a decline in her cognitive capacities. But she is still very active and participates in exercise routines she has noted more difficulties is remembering names of her church family, at social gatherings, and difficulties to remember her own address. Anomia is also accompanied by some physical changes including more hoarseness, she has noted that her dental status has changed her teeth seem to shift and she developed mouth sores. This after toes stomatitis is very painful. She has noted executive difficulties feel that it is much harder to make decisions also partially based on memory loss. She does not recall long-term memory factors as well. She feels more tired but she doesn't sleep well either. She continues to read but she forgets the content of what she read. Fidgety. The patient is currently taking lisinopril, Namenda and Aricept in the form of numbers are 28/10 and she takes warfarin continuously. She takes a fish oil and cocoa powder, melatonin 3 mg at night a multivitamin with iron, Coconut oil sage, vitamin D 3000 mg and preserve -vision Vitamin She resides at friends home [place], in a 3 room apartment. She keeps busy.     [name] is a 83 y.o. female , a retired Holiday representative , and seen here as a referral  from Dr. Felipa Eth for progressive memory loss, primarily anomia.   The [facility] are her to transfer her care from Dr Tomi Likens, based on her desire to see a female provider. She has been followed over several visits by Dr. 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 The patient has a history of paroxysmal atrial fibrillation and is on chronic anticoagulation, hypertension, coronary artery disease, polymyalgia rheumatica, depression and irritable bowel. She has meanwhile advanced from Aricept 5 mg to 10 mg daily and takes Namenda 10 mg twice daily she reports that she has more and more difficulties with short-term memory, is often very insecure about the date, about upcoming appointments, and still also confirms that she has trouble finding words sometimes midsentence. She doesn't hesitate on the phone with friends or  her adult children.  At church , she cannot longer name her co-parrisheners. She has also noticed that her sleep is now more fragmented she seems to frequently wake up between 2 and 3 AM which was not the case in 2015. She sometimes taking melatonin then after arousing from sleep. We will discuss this today as well I would also like to her [place] cognitive assessment scores. On 11/21/2013 21 points on 06/16/2014 23 points on April 13 this year 16 points. She continues to Yoga twice weekly. She finds herself in a peculiar situation at the book club, as she forgets the books contents. She still plays piano.  We will also help to establish a bedtime routine, some comforting rituals.     MMSE - Mini Mental State Exam 09/07/2016 04/25/2016 10/12/2015  Orientation to time 3 2 5   Orientation to Place 3 2 4   Registration 3 3 3   Attention/ Calculation 3 4 5  Recall 0 0 1  Language- name 2 objects 2 2 2   Language- repeat 1 1 0  Language- follow 3 step command 3 3 3   Language- read & follow direction 1 1 1   Write a sentence 1 1 1   Copy design 1 1 0  Total score 21 20 25       Last visit note; 10-12-2015- has been seen by MM on 04-25-2016     [name] is a Caucasian, married, right-handed female seen today at the request of Dr. Felipa Eth. She reports that she noted for a period of about 2 years  progressive loss of memory.  She describes is a typical for short-term memory loss for example she has trouble finding the name of certain rules or or of people she meets and is  acquainted with. Her husband just noticed over the last couple of months and that she sometimes exchanges the names of 2 neighbors ;Stanton Kidney and [place]. She has also developed some coping strategies; for example she will use a shopping list to make sure that she does not miss any items, and she also has somewhat refrained from conversations out of fear that she would not be able to contribute to be as effective as she would like to be. Mrs. Begley also as a member of a book club (where she feels unpressured and is normally one of the quieter members) that she has not felt a limitation to her ability to eloquently expressing her ideas thoughts and feelings in that comfortable environment.  She has not noted any difficulties in phone conversations.  She had trouble finding a measuring cup in her own kitchen. She wasn't sure what restaurant a friend talked about , even that she had been there. Once the friend told her it's location, she knew immediately. She feels troubled by not being able to give directions to some areas of the city, she has lived in for 34 years. Her problem is that she cannot place the names of streets, but if given a visual help point, she would be able to drive.  No paraphrasia's or neologisms.  Reading books mostly in the evenings, she often has to repeat a chapter to connect to the story again. She is calling her problem " forgetfulness'.  She has mixed up times of appointments , forgot when a friend will call her, even that this has been a routine for many years , 8 AM on Wednesdays.  She  and her husband are empty nesters. Her husband suffered a stroke with residual aphasia 4 years ago.  Her friends tell her the same problems, mostly "things float up" , meaning they have a delay in word recall , too.    Review of Systems:  Out of a complete 14 system review, the patient complains of only the following symptoms, and all other reviewed systems are negative. Memory loss .She has sometimes  chronically 3-4 times a week. No pain problems, No falls, no balance difficulties. The patient has a very good posture, supple muscle tone, symmetric strength and normal reflexes.     Social History   Social History  . Marital status: Married    Spouse name: Jeneen Rinks  . Number of children: 2  . Years of education: Masters   Occupational History  . Retired Retired   Social History Main Topics  . Smoking status: Never Smoker  . Smokeless tobacco: Never Used  . Alcohol use No  . Drug use: No  . Sexual activity: No  Other Topics Concern  . Not on file   Social History Narrative   Patient is married Jeneen Rinks) and lives at home with her husband.   Patient has two children.   Retired; Married;   Patient is right-handed.   Patient drinks very little caffeine.   Patient has a Scientist, water quality.   Has had increased stress as her husband had a stroke ~ 3wks ago and she has been taking care of him. (04/07/11)    Family History  Problem Relation Age of Onset  . Transient ischemic attack Mother   . Stroke Mother   . Cancer Father        PROSTATE  . Pancreatitis Father   . Stroke Sister   . Spina bifida Brother   . Hypertension Brother   . Heart disease Brother   . Hypertension Sister   . Diabetes Sister   . Heart disease Sister   . Dementia Sister     Past Medical History:  Diagnosis Date  . Anxiety   . Atrophic vaginitis   . CAD (coronary artery disease) 2008/2010   Catheterization 2008, 40% LAD after first diagonal  /  nuclear January, 2010 normal  . Carotid artery disease ([place])    Doppler, July, 2011, 0-39% bilateral  . Decreased platelet count ([place]) 04/2011   142,000  . Endometrial polyp   . GERD (gastroesophageal reflux disease)   . Hyperlipidemia   . Hypertension   .  Irritable bowel syndrome with constipation   . Memory difficulty 01-20-13   "memory issues"  . Normal nuclear stress test Feb 2013   No ischemia. Normal wall motion. EF greater than 70%  . Osteoarthritis   . Osteopenia   . Osteoporosis   . Palpitations    Monitor, May, 2013, no atrial fib,  one short run of PSVT  . Paroxysmal atrial fibrillation (HCC)    Coumadin therapy  . Polymyalgia rheumatica ([place])   . Spinal stenosis   . Stress   . Vitamin D deficiency     Past Surgical History:  Procedure Laterality Date  . BREAST SURGERY     BENIGN BREAST LUMP  . CATARACT SURGERY  2009  . COLONOSCOPY WITH PROPOFOL N/A 02/11/2013   Procedure: COLONOSCOPY WITH PROPOFOL;  Surgeon: [name], MD;  Location: [facility];  Service: Endoscopy;  Laterality: N/A;  . DILATION AND CURETTAGE OF UTERUS    . EXCISION MORTON'S NEUROMA  2000  . HIP SURGERY Right 2005   debridement  . HYSTEROSCOPY  02/07/2005   HYST W D&C, DX: PMB W SMALL ENDO POLYP-SURGEON [name]  . LAMINECTOMY  March 2009   L4 through L5  . TONSILLECTOMY      Current Outpatient Prescriptions  Medication Sig Dispense Refill  . lisinopril (PRINIVIL,ZESTRIL) 5 MG tablet Take 5 mg by mouth daily.     . Melatonin 1 MG TABS Take 1 tablet by mouth at bedtime as needed.     . Memantine HCl-Donepezil HCl (NAMZARIC) 28-10 MG CP24 Take 1 capsule by mouth every morning. 90 capsule 3  . warfarin (COUMADIN) 5 MG tablet TAKE TABLETS BY MOUTH AS DIRECTED BY ANTICOAGULATION CLINIC 120 tablet 1   No current facility-administered medications for this visit.     Allergies as of 09/07/2016  . (No Known Allergies)    Vitals: BP (!) 161/75   Pulse 62   Ht 5\' 2"  (1.575 m)   Wt 96 lb (43.5 kg)   BMI 17.56 kg/m  Last Weight:  Wt  Readings from Last 1 Encounters:  09/07/16 96 lb (43.5 kg)   Last Height:   Ht Readings from Last 1 Encounters:  09/07/16 5\' 2"  (1.575 m)    Physical exam:  General: The patient is awake, alert and  appears not in acute distress. The patient is well groomed. Head: Normocephalic, atraumatic. Neck is supple. Mallampati 2, neck circumference: 14 inches  Cardiovascular:  irregular rate and rhythm- atrial fibrillation , patient reports flutter. no carotid bruit, and without distended neck veins. Respiratory: Lungs are clear to auscultation. Skin:  Without evidence of edema, or rash Trunk: normal posture.  Neurologic exam : The patient is awake and alert, oriented to place and time.  Memory subjective  described as progressively impaired, MOCA here 21-30 (per Dr. Carlyle Lipa chart, her MMSE was 28-11 July 2011) . There is a normal attention span & concentration ability. Speech is fluent without  dysarthria, dysphonia or aphasia. Mood and affect are appropriate. She is quiet and demure, anxious " inside ".   Cranial nerves: Pupils are equal and briskly reactive to light. Funduscopic exam without pallor or edema. Status post cataract.   Extraocular movements  in vertical and horizontal planes intact , with gaze to the left , her right eyelid closes , with prolonged upward gaze, ptosis is noted on the right.   Visual fields by finger perimetry are intact. Hearing to air-conduction impaired on the right, louder in bone conduction.   Facial sensation intact to fine touch.  Facial motor strength is symmetric and tongue and uvula move midline.  Motor exam: Normal tone , muscle bulk and symmetric normal strength in all extremities. Sensory:  Fine touch, pinprick and vibration were tested in all extremities. Coordination: Rapid alternating movements in the fingers/hands is tested and normal. Finger-to-nose maneuver  without evidence of ataxia, only mild dysmetria and no tremor. Gait and station: Patient walks without assistive device . Deep tendon reflexes: in the  upper and lower extremities are symmetric and intact. Babinski maneuver response is downgoing.   Assessment:  After physical and neurologic  examination, review of laboratory studies, imaging, neurophysiology testing and pre-existing records, assessment is   [name] has developed a cognitive deficit by [facility] cognitive assessment test that would place her into the category of early dementia, given her MRI distribution of atrophy, this would be the Alzheimer's type.  She was previously diagnosed with a amnestic mild cognitive impairment, which has a conversion rate to Alzheimer's dementia of 7% per year. Today's Mini-Mental Status Examination showed 21 out of 30 points . The patient and her husband have both noted her to be more anxious, perhaps is also a level of frustration. I do think that the anxiety to some degree creates a friend take an environment that doesn't allow her to enjoy social activities as much and also hinders her to retain as much information as she could. I would like for her to plan some resting periods during the day between activities and I offered her a low dose antidepressant. This may also improve her sleep pattern. I will print a prescription for Zoloft 25 mg and let her decide if she wants to fill it.  I like for Mrs. Lege to establish a bedtime routine that does not include screen time. I would like for her to check her emails perhaps right after dinner but not before going to bed. She should take melatonin at a dose between 1 and 5 mg which of her comfortable at bedtime, this may help  her to wake up later than the usual interruption between 2 and 3 AM. Melatonin can increase vivid dreams and if she feels that this is a concern I would have changed my recommendations. She states that the dreams are not nightmarish and not threatening or burdensome. I like for her to continue Aricept as well as Namenda. My goal would be to increase the Aricept to 23 mg daily if tolerated. She uses namzaric.   Continue Yoga, reading and piano, keep going out- be social.    Rv in 6 month with MOCA and MMSE ( world, not  serial sevens) in 6 month. Depression screening . NP visit.   Larey Seat, MD

## 2016-09-07 NOTE — Patient Instructions (Signed)
Alzheimer Disease Caregiver Guide A person who has Alzheimer disease may not be able to take care of himself or herself. He or she may need help with simple tasks. The tips below can help you care for the person. Memory loss and confusion If the person is confused or cannot remember things:  Stay calm.  Respond with a short answer.  Avoid correcting him or her in a way that sounds like scolding.  Try not to take it personally, even if he or she forgets your name.  Behavior changes The person may go through behavior changes. This can include depression, anxiety, anger, or seeing things that are not there. When behavior changes:  Try not to take behavior changes personally.  Stay calm and patient.  Do not argue or try to convince the person about a specific point.  Know that these changes are part of the disease process. Try to work through it.  Tips to lessen frustration  Make appointments and do daily tasks when the person is at his or her best.  Take your time. Simple tasks may take longer. Allow plenty of time to complete tasks.  Limit choices for the person.  Involve the person in what you are doing.  Stick to a routine.  Avoid new or crowded places, if possible.  Use simple words, short sentences, and a calm voice. Only give 1 direction at a time.  Buy clothes and shoes that are easy to put on and take off.  Let people help if they offer. Home safety  Keep floors clear. Remove rugs, magazine racks, and floor lamps.  Keep hallways well lit.  Put a handrail and nonslip mat in the bathtub or shower.  Put childproof locks on cabinets that have dangerous items in them. These items include medicine, alcohol, guns, toxic cleaning items, sharp tools, matches, or lighters.  Place locks on doors where the person cannot see or reach them. This helps the person to not wander out of the house and get lost.  Be prepared for emergencies. Keep a list of emergency phone  numbers and addresses in a handy area. Plans for the future  Talk about finances. ? Talk about money management. People with Alzheimer disease have trouble managing their money as the disease gets worse. ? Get help from professional advisors about financial and legal matters.  Talk about future care. ? Choose a power of attorney. This is someone who can make decisions for the person with Alzheimer disease when he or she can no longer do so. ? Talk about driving and when it is the right time to stop. The person's doctor can help with this. ? If the person lives alone, make sure he or she is safe. Some people need extra help at home. Other people need more care at a nursing home or care center. Support groups Some benefits of joining a support group include:  Learning ways to manage stress.  Sharing experiences with others.  Getting emotional comfort and support.  Learning new caregiving skills as the disease progresses.  Knowing what community resources are available and taking advantage of them.  Get help if:  The person has a fever.  The person has a sudden behavior change that does not get better with calming strategies.  The person is unable to manage his or her living situation.  The person threatens you or anyone else, including himself or herself.  You are no longer able to care for the person. This information is not   intended to replace advice given to you by your health care provider. Make sure you discuss any questions you have with your health care provider. Document Released: 05/22/2011 Document Revised: 08/05/2015 Document Reviewed: 04/19/2011 Elsevier Interactive Patient Education  2017 [employer]  

## 2016-09-08 ENCOUNTER — Ambulatory Visit (INDEPENDENT_AMBULATORY_CARE_PROVIDER_SITE_OTHER): Payer: Medicare Other

## 2016-09-08 DIAGNOSIS — Z5181 Encounter for therapeutic drug level monitoring: Secondary | ICD-10-CM

## 2016-09-08 DIAGNOSIS — I2510 Atherosclerotic heart disease of native coronary artery without angina pectoris: Secondary | ICD-10-CM

## 2016-09-08 DIAGNOSIS — I480 Paroxysmal atrial fibrillation: Secondary | ICD-10-CM

## 2016-09-08 LAB — POCT INR: INR: 2.2

## 2016-09-18 DIAGNOSIS — R002 Palpitations: Secondary | ICD-10-CM | POA: Diagnosis not present

## 2016-09-18 DIAGNOSIS — B379 Candidiasis, unspecified: Secondary | ICD-10-CM | POA: Diagnosis not present

## 2016-09-18 DIAGNOSIS — I10 Essential (primary) hypertension: Secondary | ICD-10-CM | POA: Diagnosis not present

## 2016-09-23 DIAGNOSIS — Z1231 Encounter for screening mammogram for malignant neoplasm of breast: Secondary | ICD-10-CM | POA: Diagnosis not present

## 2016-09-23 DIAGNOSIS — Z803 Family history of malignant neoplasm of breast: Secondary | ICD-10-CM | POA: Diagnosis not present

## 2016-09-29 ENCOUNTER — Ambulatory Visit (INDEPENDENT_AMBULATORY_CARE_PROVIDER_SITE_OTHER): Payer: Medicare Other | Admitting: *Deleted

## 2016-09-29 DIAGNOSIS — I480 Paroxysmal atrial fibrillation: Secondary | ICD-10-CM

## 2016-09-29 DIAGNOSIS — I2510 Atherosclerotic heart disease of native coronary artery without angina pectoris: Secondary | ICD-10-CM

## 2016-09-29 DIAGNOSIS — Z5181 Encounter for therapeutic drug level monitoring: Secondary | ICD-10-CM | POA: Diagnosis not present

## 2016-09-29 LAB — POCT INR: INR: 1.5

## 2016-10-02 ENCOUNTER — Ambulatory Visit: Payer: Medicare Other | Admitting: Adult Health

## 2016-10-02 ENCOUNTER — Telehealth: Payer: Self-pay | Admitting: Neurology

## 2016-10-02 NOTE — Telephone Encounter (Signed)
Patient arrived today - apparently her apt was cancelled and she insists she did not do it. She is very upset and wanted to be seen by [facility] before October w. Dr. Brett Fairy. Best call back [phone]

## 2016-10-02 NOTE — Telephone Encounter (Signed)
Called and spoke with pt. im not sure where the confusion came from on the apt's but I did see where the pt had a follow up apt in October. I reminded the patient of that one and that per her last office note Dr Dohmeier recommended a 6 mth follow up. So I just informed the pt that we will keep the current apt scheduled in October and go from there. Pt verbalized understanding

## 2016-10-04 ENCOUNTER — Other Ambulatory Visit: Payer: Self-pay | Admitting: *Deleted

## 2016-10-04 MED ORDER — WARFARIN SODIUM 5 MG PO TABS
ORAL_TABLET | ORAL | 1 refills | Status: DC
Start: 2016-10-04 — End: 2017-04-23

## 2016-10-04 NOTE — Telephone Encounter (Signed)
Received a paper refill request for the pt's Warafarin 5mg ; filled electronically.

## 2016-10-06 DIAGNOSIS — B373 Candidiasis of vulva and vagina: Secondary | ICD-10-CM | POA: Diagnosis not present

## 2016-10-06 DIAGNOSIS — Z0000 Encounter for general adult medical examination without abnormal findings: Secondary | ICD-10-CM | POA: Diagnosis not present

## 2016-10-06 DIAGNOSIS — Z1389 Encounter for screening for other disorder: Secondary | ICD-10-CM | POA: Diagnosis not present

## 2016-10-09 DIAGNOSIS — I10 Essential (primary) hypertension: Secondary | ICD-10-CM | POA: Diagnosis not present

## 2016-10-09 DIAGNOSIS — I482 Chronic atrial fibrillation: Secondary | ICD-10-CM | POA: Diagnosis not present

## 2016-10-09 DIAGNOSIS — L439 Lichen planus, unspecified: Secondary | ICD-10-CM | POA: Diagnosis not present

## 2016-10-09 DIAGNOSIS — J029 Acute pharyngitis, unspecified: Secondary | ICD-10-CM | POA: Diagnosis not present

## 2016-10-09 DIAGNOSIS — E46 Unspecified protein-calorie malnutrition: Secondary | ICD-10-CM | POA: Diagnosis not present

## 2016-10-09 DIAGNOSIS — Z79899 Other long term (current) drug therapy: Secondary | ICD-10-CM | POA: Diagnosis not present

## 2016-10-09 DIAGNOSIS — F5101 Primary insomnia: Secondary | ICD-10-CM | POA: Diagnosis not present

## 2016-10-09 DIAGNOSIS — G301 Alzheimer's disease with late onset: Secondary | ICD-10-CM | POA: Diagnosis not present

## 2016-10-13 ENCOUNTER — Ambulatory Visit (INDEPENDENT_AMBULATORY_CARE_PROVIDER_SITE_OTHER): Payer: Medicare Other

## 2016-10-13 DIAGNOSIS — I2510 Atherosclerotic heart disease of native coronary artery without angina pectoris: Secondary | ICD-10-CM | POA: Diagnosis not present

## 2016-10-13 DIAGNOSIS — Z5181 Encounter for therapeutic drug level monitoring: Secondary | ICD-10-CM

## 2016-10-13 DIAGNOSIS — I480 Paroxysmal atrial fibrillation: Secondary | ICD-10-CM

## 2016-10-13 LAB — POCT INR: INR: 6.1

## 2016-10-13 LAB — PROTIME-INR
INR: 4.7 — AB (ref 0.8–1.2)
PROTHROMBIN TIME: 45.3 s — AB (ref 9.1–12.0)

## 2016-10-23 ENCOUNTER — Ambulatory Visit (INDEPENDENT_AMBULATORY_CARE_PROVIDER_SITE_OTHER): Payer: Medicare Other | Admitting: *Deleted

## 2016-10-23 DIAGNOSIS — Z5181 Encounter for therapeutic drug level monitoring: Secondary | ICD-10-CM

## 2016-10-23 DIAGNOSIS — I2510 Atherosclerotic heart disease of native coronary artery without angina pectoris: Secondary | ICD-10-CM

## 2016-10-23 DIAGNOSIS — I480 Paroxysmal atrial fibrillation: Secondary | ICD-10-CM | POA: Diagnosis not present

## 2016-10-23 LAB — POCT INR: INR: 2

## 2016-10-24 ENCOUNTER — Ambulatory Visit: Payer: Medicare Other | Admitting: Neurology

## 2016-10-31 DIAGNOSIS — H353211 Exudative age-related macular degeneration, right eye, with active choroidal neovascularization: Secondary | ICD-10-CM | POA: Diagnosis not present

## 2016-10-31 DIAGNOSIS — H353122 Nonexudative age-related macular degeneration, left eye, intermediate dry stage: Secondary | ICD-10-CM | POA: Diagnosis not present

## 2016-11-02 DIAGNOSIS — L439 Lichen planus, unspecified: Secondary | ICD-10-CM | POA: Diagnosis not present

## 2016-11-06 ENCOUNTER — Ambulatory Visit (INDEPENDENT_AMBULATORY_CARE_PROVIDER_SITE_OTHER): Payer: Medicare Other | Admitting: Pharmacist

## 2016-11-06 DIAGNOSIS — I2510 Atherosclerotic heart disease of native coronary artery without angina pectoris: Secondary | ICD-10-CM

## 2016-11-06 DIAGNOSIS — Z5181 Encounter for therapeutic drug level monitoring: Secondary | ICD-10-CM

## 2016-11-06 DIAGNOSIS — I480 Paroxysmal atrial fibrillation: Secondary | ICD-10-CM

## 2016-11-06 LAB — POCT INR: INR: 4.4

## 2016-11-17 ENCOUNTER — Ambulatory Visit (INDEPENDENT_AMBULATORY_CARE_PROVIDER_SITE_OTHER): Payer: Medicare Other | Admitting: *Deleted

## 2016-11-17 DIAGNOSIS — I480 Paroxysmal atrial fibrillation: Secondary | ICD-10-CM | POA: Diagnosis not present

## 2016-11-17 DIAGNOSIS — Z5181 Encounter for therapeutic drug level monitoring: Secondary | ICD-10-CM | POA: Diagnosis not present

## 2016-11-17 DIAGNOSIS — N898 Other specified noninflammatory disorders of vagina: Secondary | ICD-10-CM | POA: Diagnosis not present

## 2016-11-17 LAB — POCT INR: INR: 2.6

## 2016-12-01 DIAGNOSIS — G301 Alzheimer's disease with late onset: Secondary | ICD-10-CM | POA: Diagnosis not present

## 2016-12-01 DIAGNOSIS — E46 Unspecified protein-calorie malnutrition: Secondary | ICD-10-CM | POA: Diagnosis not present

## 2016-12-01 DIAGNOSIS — I10 Essential (primary) hypertension: Secondary | ICD-10-CM | POA: Diagnosis not present

## 2016-12-01 DIAGNOSIS — I482 Chronic atrial fibrillation: Secondary | ICD-10-CM | POA: Diagnosis not present

## 2016-12-08 ENCOUNTER — Ambulatory Visit (INDEPENDENT_AMBULATORY_CARE_PROVIDER_SITE_OTHER): Payer: Medicare Other

## 2016-12-08 DIAGNOSIS — I480 Paroxysmal atrial fibrillation: Secondary | ICD-10-CM

## 2016-12-08 DIAGNOSIS — Z5181 Encounter for therapeutic drug level monitoring: Secondary | ICD-10-CM

## 2016-12-08 LAB — POCT INR: INR: 1.8

## 2016-12-19 DIAGNOSIS — Z124 Encounter for screening for malignant neoplasm of cervix: Secondary | ICD-10-CM | POA: Diagnosis not present

## 2016-12-22 ENCOUNTER — Ambulatory Visit (INDEPENDENT_AMBULATORY_CARE_PROVIDER_SITE_OTHER): Payer: Medicare Other | Admitting: *Deleted

## 2016-12-22 DIAGNOSIS — Z5181 Encounter for therapeutic drug level monitoring: Secondary | ICD-10-CM

## 2016-12-22 DIAGNOSIS — I480 Paroxysmal atrial fibrillation: Secondary | ICD-10-CM

## 2016-12-22 LAB — POCT INR: INR: 2.3

## 2016-12-25 DIAGNOSIS — J040 Acute laryngitis: Secondary | ICD-10-CM | POA: Diagnosis not present

## 2016-12-25 DIAGNOSIS — J029 Acute pharyngitis, unspecified: Secondary | ICD-10-CM | POA: Diagnosis not present

## 2016-12-25 DIAGNOSIS — J028 Acute pharyngitis due to other specified organisms: Secondary | ICD-10-CM | POA: Diagnosis not present

## 2017-01-08 ENCOUNTER — Ambulatory Visit (INDEPENDENT_AMBULATORY_CARE_PROVIDER_SITE_OTHER): Payer: Medicare Other | Admitting: Neurology

## 2017-01-08 ENCOUNTER — Encounter: Payer: Self-pay | Admitting: Neurology

## 2017-01-08 VITALS — BP 134/63 | HR 52 | Ht 62.0 in | Wt 96.0 lb

## 2017-01-08 DIAGNOSIS — I2510 Atherosclerotic heart disease of native coronary artery without angina pectoris: Secondary | ICD-10-CM | POA: Diagnosis not present

## 2017-01-08 DIAGNOSIS — F0280 Dementia in other diseases classified elsewhere without behavioral disturbance: Secondary | ICD-10-CM | POA: Diagnosis not present

## 2017-01-08 DIAGNOSIS — G301 Alzheimer's disease with late onset: Secondary | ICD-10-CM | POA: Diagnosis not present

## 2017-01-08 NOTE — Progress Notes (Signed)
[facility]  Provider:  Larey Seat, M D  Referring Provider: Lajean Manes, MD Primary Care Physician:  Lajean Manes, MD  Chief Complaint  Patient presents with  . Follow-up    pt with husband, rm 73. pt states she feels she is the same    HPI:  Interval history from 01/08/2017. I have pleasure of seeing [name] today for a memory follow-up. The patient reports that she feels that her mind is a little foggy these days, she does not feel perfectly normal and clean. When she wakes up in the morning she has to think hard which day of the month it is and which day of the week. She has remained true to some of her social activities but has left other groups she used to participate in. She has a more difficult time to understand instructions, feels this is independent of time of day, food intake and hydration. She feels forgetful. She writes more things down, keeps lists.     09-07-2016 I have the pleasure of seeing [name] today again , who has noted a decline in her cognitive capacities. But she is still very active and participates in exercise routines she has noted more difficulties is remembering names of her church family, at social gatherings, and difficulties to remember her own address. Anomia is also accompanied by some physical changes including more hoarseness, she has noted that her dental status has changed her teeth seem to shift and she developed mouth sores. This aphtose stomatitis is very painful. She has noted executive difficulties feel that it is much harder to make decisions also partially based on memory loss. She does not recall long-term memory factors as well. She feels more tired but she doesn't sleep well either. She continues to read but she forgets the content of what she read. Fidgety. The patient is currently taking lisinopril, Namenda and Aricept in the form of numbers are 28/10 and she takes warfarin continuously. She takes  a fish oil and cocoa powder, melatonin 3 mg at night a multivitamin with iron, Coconut oil sage, vitamin D 3000 mg and preserve -vision Vitamin She resides at friends [facility] [place], in a 3 room apartment. She keeps busy.     [name] is a 83 y.o. female , a retired Holiday representative , and seen here as a referral  from Dr. Felipa Eth for progressive memory loss, primarily anomia.   The [facility] are her to transfer her care from Dr Tomi Likens, based on her desire to see a female provider. She has been followed over several visits by Dr. 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 The patient has a history of paroxysmal atrial fibrillation and is on chronic anticoagulation, hypertension, coronary artery disease, polymyalgia rheumatica, depression and irritable bowel. She has meanwhile advanced from Aricept 5 mg to 10 mg daily and takes Namenda 10 mg twice daily she reports that she has more and more difficulties with short-term memory, is often very insecure about the date, about upcoming appointments, and still also confirms that she has trouble finding words sometimes midsentence. She doesn't hesitate on the phone with friends or  her adult children.  At church , she cannot longer name her co-parrisheners. She has also noticed that her sleep is now more fragmented she seems to frequently wake up  between 2 and 3 AM which was not the case in 2015. She sometimes taking melatonin then after arousing from sleep. We will discuss this today as well I would also like to her [place] cognitive assessment scores. On 11/21/2013 21 points on 06/16/2014 23 points on April 13 this year 16 points. She continues to Yoga twice weekly. She finds herself in a peculiar situation at the book club, as she forgets the books contents. She still plays piano.  We will  also help to establish a bedtime routine, some comforting rituals.     MMSE - Mini Mental State Exam 01/08/2017 09/07/2016 04/25/2016  Orientation to time 3 3 2   Orientation to Place 3 3 2   Registration 3 3 3   Attention/ Calculation 3 3 4   Recall 0 0 0  Language- name 2 objects 2 2 2   Language- repeat 1 1 1   Language- follow 3 step command 3 3 3   Language- read & follow direction 1 1 1   Write a sentence 1 1 1   Copy design 1 1 1   Total score 21 21 20        10-12-2015- has been seen by MM on 04-25-2016     [name] is a Caucasian, married, right-handed female seen today at the request of Dr. Felipa Eth. She reports that she noted for a period of about 2 years progressive loss of memory.  She describes is a typical for short-term memory loss for example she has trouble finding the name of certain rules or or of people she meets and is  acquainted with. Her husband just noticed over the last couple of months and that she sometimes exchanges the names of 2 neighbors ;[place] and [place]. She has also developed some coping strategies; for example she will use a shopping list to make sure that she does not miss any items, and she also has somewhat refrained from conversations out of fear that she would not be able to contribute to be as effective as she would like to be. Mrs. Weihe also as a member of a book club (where she feels unpressured and is normally one of the quieter members) that she has not felt a limitation to her ability to eloquently expressing her ideas thoughts and feelings in that comfortable environment.  She has not noted any difficulties in phone conversations.  She had trouble finding a measuring cup in her own kitchen. She wasn't sure what restaurant a friend talked about , even that she had been there. Once the friend told her it's location, she knew immediately. She feels troubled by not being able to give directions to some areas of the city, she has lived in for 42 years. Her  problem is that she cannot place the names of streets, but if given a visual help point, she would be able to drive.  No paraphrasia's or neologisms.  Reading books mostly in the evenings, she often has to repeat a chapter to connect to the story again. She is calling her problem " forgetfulness'.  She has mixed up times of appointments , forgot when a friend will call her, even that this has been a routine for many years , 8 AM on Wednesdays.  She  and her husband are empty nesters. Her husband suffered a stroke with residual aphasia 4 years ago.  Her friends tell her the same problems, mostly "things float up" , meaning they have a delay in word recall , too.   Review of Systems:  Out of  a complete 14 system review, the patient complains of only the following symptoms, and all other reviewed systems are negative. Memory loss .She has sometimes  chronically 3-4 times a week. No pain problems, No falls, no balance difficulties. The patient has a very good posture, supple muscle tone, symmetric strength and normal reflexes. She feels "foggy headed". Attends exercise classes.      Social History   Social History  . Marital status: Married    Spouse name: Jeneen Rinks  . Number of children: 2  . Years of education: Masters   Occupational History  . Retired Retired   Social History Main Topics  . Smoking status: Never Smoker  . Smokeless tobacco: Never Used  . Alcohol use No  . Drug use: No  . Sexual activity: No   Other Topics Concern  . Not on file   Social History Narrative   Patient is married Jeneen Rinks) and lives at home with her husband.   Patient has two children.   Retired; Married;   Patient is right-handed.   Patient drinks very little caffeine.   Patient has a Scientist, water quality.   Has had increased stress as her husband had a stroke ~ 3wks ago and she has been taking care of him. (04/07/11)    Family History  Problem Relation Age of Onset  . Transient ischemic attack  Mother   . Stroke Mother   . Cancer Father        PROSTATE  . Pancreatitis Father   . Stroke Sister   . Spina bifida Brother   . Hypertension Brother   . Heart disease Brother   . Hypertension Sister   . Diabetes Sister   . Heart disease Sister   . Dementia Sister     Past Medical History:  Diagnosis Date  . Anxiety   . Atrophic vaginitis   . CAD (coronary artery disease) 2008/2010   Catheterization 2008, 40% LAD after first diagonal  /  nuclear January, 2010 normal  . Carotid artery disease ([place])    Doppler, July, 2011, 0-39% bilateral  . Decreased platelet count ([place]) 04/2011   142,000  . Endometrial polyp   . GERD (gastroesophageal reflux disease)   . Hyperlipidemia   . Hypertension   . Irritable bowel syndrome with constipation   . Memory difficulty 01-20-13   "memory issues"  . Normal nuclear stress test Feb 2013   No ischemia. Normal wall motion. EF greater than 70%  . Osteoarthritis   . Osteopenia   . Osteoporosis   . Palpitations    Monitor, May, 2013, no atrial fib,  one short run of PSVT  . Paroxysmal atrial fibrillation (HCC)    Coumadin therapy  . Polymyalgia rheumatica ([place])   . Spinal stenosis   . Stress   . Vitamin D deficiency     Past Surgical History:  Procedure Laterality Date  . BREAST SURGERY     BENIGN BREAST LUMP  . CATARACT SURGERY  2009  . COLONOSCOPY WITH PROPOFOL N/A 02/11/2013   Procedure: COLONOSCOPY WITH PROPOFOL;  Surgeon: [name], MD;  Location: [facility];  Service: Endoscopy;  Laterality: N/A;  . DILATION AND CURETTAGE OF UTERUS    . EXCISION MORTON'S NEUROMA  2000  . HIP SURGERY Right 2005   debridement  . HYSTEROSCOPY  02/07/2005   HYST W D&C, DX: PMB W SMALL ENDO POLYP-SURGEON [name]  . LAMINECTOMY  March 2009   L4 through L5  . TONSILLECTOMY  Current Outpatient Prescriptions  Medication Sig Dispense Refill  . lisinopril (PRINIVIL,ZESTRIL) 5 MG tablet Take 5 mg by mouth daily.     . Melatonin 1  MG TABS Take 1 tablet by mouth at bedtime as needed.     . Memantine HCl-Donepezil HCl (NAMZARIC) 28-10 MG CP24 Take 1 capsule by mouth every morning. 90 capsule 3  . warfarin (COUMADIN) 5 MG tablet TAKE TABLETS BY MOUTH AS DIRECTED BY ANTICOAGULATION CLINIC 120 tablet 1   No current facility-administered medications for this visit.     Allergies as of 01/08/2017  . (No Known Allergies)    Vitals: BP 134/63   Pulse (!) 52   Ht 5\' 2"  (1.575 m)   Wt 96 lb (43.5 kg)   BMI 17.56 kg/m  Last Weight:  Wt Readings from Last 1 Encounters:  01/08/17 96 lb (43.5 kg)   Last Height:   Ht Readings from Last 1 Encounters:  01/08/17 5\' 2"  (1.575 m)    Physical exam:  General: The patient is awake, alert and appears not in acute distress. The patient is well groomed. Head: Normocephalic, atraumatic. Neck is supple. Mallampati 2, neck circumference: 14 inches  Cardiovascular:  irregular rate and rhythm- atrial fibrillation , patient reports flutter. no carotid bruit, and without distended neck veins. Respiratory: Lungs are clear to auscultation. Skin:  Without evidence of edema, or rash Trunk: normal posture.  Neurologic exam : The patient is awake and alert, oriented to place and time.   Memory subjective  described as progressively impaired, MOCA here 21-30 (per Dr. Carlyle Lipa chart, her MMSE was 28-11 July 2011) .  There is a normal attention span & concentration ability. Speech is fluent without  dysarthria, dysphonia or aphasia.  Mood and affect are worried- not obviously anxious- She is quiet and demure, anxious " inside ".   Cranial nerves: Pupils are equal and briskly reactive to light. Funduscopic exam without pallor or edema. Status post cataract.   Extraocular movements  in vertical and horizontal planes intact , with gaze to the left , her right eyelid closes , with prolonged upward gaze, ptosis is noted on the right.   Visual fields by finger perimetry are intact. Hearing  to air-conduction impaired on the right, louder in bone conduction.   Facial sensation intact to fine touch.  Facial motor strength is symmetric and tongue and uvula move midline.  Motor exam: Normal tone , muscle bulk and symmetric normal strength in all extremities. Sensory:  Fine touch, pinprick and vibration were tested in all extremities. Coordination: Rapid alternating movements in the fingers/hands is tested and normal. Finger-to-nose maneuver  without evidence of ataxia, only mild dysmetria and no tremor. Gait and station: Patient walks without assistive device . Deep tendon reflexes: in the  upper and lower extremities are symmetric and intact. Babinski maneuver response is downgoing.   Assessment:  After physical and neurologic examination, review of laboratory studies, imaging, neurophysiology testing and pre-existing records, assessment is  [name] has developed a cognitive deficit by [facility] cognitive assessment test that would place her into the category of early dementia, given her MRI distribution of atrophy, this would be the Alzheimer's type.   She was previously diagnosed with a amnestic mild cognitive impairment, which has a conversion rate to Alzheimer's dementia of 7% per year. Today's Mini-Mental Status Examination showed 21 out of 30 points . The patient and her husband have both noted her to be more anxious, perhaps is also a level of frustration.  I do think that the anxiety to some degree creates a friend take an environment that doesn't allow her to enjoy social activities as much and also hinders her to retain as much information as she could. Zoloft 25 mg was not filled.   IShe should take melatonin at a dose between 1 and 5 mg which of her comfortable at bedtime, this may help her to wake up later than the usual interruption between 2 and 3 AM. Melatonin can increase vivid dreams and if she feels that this is a concern I would have changed my recommendations. She  states that the dreams are not nightmarish and not threatening or burdensome.  I like for her to continue Aricept as well as Namenda. My goal would be to increase the Aricept to 23 mg daily if tolerated. She uses namzaric.   Continue Yoga, reading and piano, keep going out- be social.    Rv in 6 month with MOCA and MMSE ( world, not serial sevens) in 6 month. Depression screening 1/ 15 on 10-29=2018 . NP visit.   Larey Seat, MD

## 2017-01-08 NOTE — Patient Instructions (Signed)
Disease Caregiver Guide A person who has a dementia disease may not be able to take care of himself or herself. He or she may need help with simple tasks. The tips below can help you care for the person. Memory loss and confusion If the person is confused or cannot remember things:  Stay calm.  Respond with a short answer.  Avoid correcting him or her in a way that sounds like scolding.  Try not to take it personally, even if he or she forgets your name.  Behavior changes The person may go through behavior changes. This can include depression, anxiety, anger, or seeing things that are not there. When behavior changes:  Try not to take behavior changes personally.  Stay calm and patient.  Do not argue or try to convince the person about a specific point.  Know that these changes are part of the disease process. Try to work through it.  Tips to lessen frustration  Make appointments and do daily tasks when the person is at his or her best.  Take your time. Simple tasks may take longer. Allow plenty of time to complete tasks.  Limit choices for the person.  Involve the person in what you are doing.  Stick to a routine.  Avoid new or crowded places, if possible.  Use simple words, short sentences, and a calm voice. Only give 1 direction at a time.  Buy clothes and shoes that are easy to put on and take off.  Let people help if they offer. Home safety  Keep floors clear. Remove rugs, magazine racks, and floor lamps.  Keep hallways well lit.  Put a handrail and nonslip mat in the bathtub or shower.  Put childproof locks on cabinets that have dangerous items in them. These items include medicine, alcohol, guns, toxic cleaning items, sharp tools, matches, or lighters.  Place locks on doors where the person cannot see or reach them. This helps the person to not wander out of the house and get lost.  Be prepared for emergencies. Keep a list of emergency phone numbers and  addresses in a handy area. Plans for the future  Talk about finances. ? Talk about money management. People with Alzheimer disease have trouble managing their money as the disease gets worse. ? Get help from professional advisors about financial and legal matters.  Talk about future care. ? Choose a power of attorney. This is someone who can make decisions for the person with Alzheimer disease when he or she can no longer do so. ? Talk about driving and when it is the right time to stop. The person's doctor can help with this. ? If the person lives alone, make sure he or she is safe. Some people need extra help at home. Other people need more care at a nursing home or care center. Support groups Some benefits of joining a support group include:  Learning ways to manage stress.  Sharing experiences with others.  Getting emotional comfort and support.  Learning new caregiving skills as the disease progresses.  Knowing what community resources are available and taking advantage of them.  Get help if:  The person has a fever.  The person has a sudden behavior change that does not get better with calming strategies.  The person is unable to manage his or her living situation.  The person threatens you or anyone else, including himself or herself.  You are no longer able to care for the person. This information is not  intended to replace advice given to you by your health care provider. Make sure you discuss any questions you have with your health care provider. Document Released: 05/22/2011 Document Revised: 08/05/2015 Document Reviewed: 04/19/2011 Elsevier Interactive Patient Education  2017 Elsevier Inc.  

## 2017-01-10 DIAGNOSIS — H353211 Exudative age-related macular degeneration, right eye, with active choroidal neovascularization: Secondary | ICD-10-CM | POA: Diagnosis not present

## 2017-01-12 ENCOUNTER — Ambulatory Visit (INDEPENDENT_AMBULATORY_CARE_PROVIDER_SITE_OTHER): Payer: Medicare Other | Admitting: Pharmacist

## 2017-01-12 DIAGNOSIS — I480 Paroxysmal atrial fibrillation: Secondary | ICD-10-CM | POA: Diagnosis not present

## 2017-01-12 DIAGNOSIS — Z5181 Encounter for therapeutic drug level monitoring: Secondary | ICD-10-CM | POA: Diagnosis not present

## 2017-01-12 DIAGNOSIS — L439 Lichen planus, unspecified: Secondary | ICD-10-CM | POA: Diagnosis not present

## 2017-01-12 LAB — POCT INR: INR: 1.3

## 2017-01-22 ENCOUNTER — Ambulatory Visit (INDEPENDENT_AMBULATORY_CARE_PROVIDER_SITE_OTHER): Payer: Medicare Other | Admitting: *Deleted

## 2017-01-22 DIAGNOSIS — Z5181 Encounter for therapeutic drug level monitoring: Secondary | ICD-10-CM | POA: Diagnosis not present

## 2017-01-22 DIAGNOSIS — I480 Paroxysmal atrial fibrillation: Secondary | ICD-10-CM

## 2017-01-22 LAB — POCT INR: INR: 1.5

## 2017-01-22 NOTE — Progress Notes (Signed)
Take 2 tablets (10mg  total) today Nov 12th then change coumadin dose to 1.5 tablets daily except 1 tablet on  Tuesdays Thursdays and Saturdays Recheck in  2 weeks

## 2017-02-05 ENCOUNTER — Ambulatory Visit (INDEPENDENT_AMBULATORY_CARE_PROVIDER_SITE_OTHER): Payer: Medicare Other | Admitting: *Deleted

## 2017-02-05 DIAGNOSIS — Z5181 Encounter for therapeutic drug level monitoring: Secondary | ICD-10-CM

## 2017-02-05 DIAGNOSIS — I480 Paroxysmal atrial fibrillation: Secondary | ICD-10-CM | POA: Diagnosis not present

## 2017-02-05 LAB — POCT INR: INR: 1.6

## 2017-02-05 NOTE — Patient Instructions (Signed)
Today take 2 tablets then start taking 1.5 tablets daily except 1 tablet on Tuesday and Saturdays.     Recheck in 10 days.

## 2017-02-15 ENCOUNTER — Ambulatory Visit (INDEPENDENT_AMBULATORY_CARE_PROVIDER_SITE_OTHER): Payer: Medicare Other

## 2017-02-15 DIAGNOSIS — I480 Paroxysmal atrial fibrillation: Secondary | ICD-10-CM

## 2017-02-15 DIAGNOSIS — Z5181 Encounter for therapeutic drug level monitoring: Secondary | ICD-10-CM

## 2017-02-15 LAB — POCT INR: INR: 2.6

## 2017-02-15 NOTE — Patient Instructions (Signed)
Continue on same dosage 1.5 tablets daily except 1 tablet on Tuesday and Saturdays.     Recheck in 2 weeks.

## 2017-02-28 ENCOUNTER — Ambulatory Visit (INDEPENDENT_AMBULATORY_CARE_PROVIDER_SITE_OTHER): Payer: Medicare Other | Admitting: *Deleted

## 2017-02-28 DIAGNOSIS — I480 Paroxysmal atrial fibrillation: Secondary | ICD-10-CM | POA: Diagnosis not present

## 2017-02-28 DIAGNOSIS — Z5181 Encounter for therapeutic drug level monitoring: Secondary | ICD-10-CM

## 2017-02-28 LAB — POCT INR: INR: 2.7

## 2017-02-28 NOTE — Patient Instructions (Signed)
Description   Continue on same dosage 1.5 tablets daily except 1 tablet on Tuesday and Saturdays.     Recheck in 3 weeks.

## 2017-03-04 DIAGNOSIS — R42 Dizziness and giddiness: Secondary | ICD-10-CM | POA: Diagnosis not present

## 2017-03-04 DIAGNOSIS — J069 Acute upper respiratory infection, unspecified: Secondary | ICD-10-CM | POA: Diagnosis not present

## 2017-03-04 DIAGNOSIS — R9431 Abnormal electrocardiogram [ECG] [EKG]: Secondary | ICD-10-CM | POA: Diagnosis not present

## 2017-03-21 DIAGNOSIS — H353211 Exudative age-related macular degeneration, right eye, with active choroidal neovascularization: Secondary | ICD-10-CM | POA: Diagnosis not present

## 2017-03-23 ENCOUNTER — Ambulatory Visit (INDEPENDENT_AMBULATORY_CARE_PROVIDER_SITE_OTHER): Payer: Medicare Other | Admitting: Pharmacist

## 2017-03-23 DIAGNOSIS — Z5181 Encounter for therapeutic drug level monitoring: Secondary | ICD-10-CM | POA: Diagnosis not present

## 2017-03-23 DIAGNOSIS — I480 Paroxysmal atrial fibrillation: Secondary | ICD-10-CM | POA: Diagnosis not present

## 2017-03-23 LAB — POCT INR: INR: 2.9

## 2017-03-23 NOTE — Patient Instructions (Signed)
Description   Continue on same dosage 1.5 tablets daily except 1 tablet on Tuesday and Saturdays.     Recheck in 6 weeks.

## 2017-04-09 DIAGNOSIS — R070 Pain in throat: Secondary | ICD-10-CM | POA: Diagnosis not present

## 2017-04-16 NOTE — Progress Notes (Signed)
Cardiology Office Note    Date:  04/17/2017   ID:  [name], DOB 1933/11/19, MRN [id number]  PCP:  Lajean Manes, MD  Cardiologist:  Dr. Acie Fredrickson  Chief Complaint: palpitations   History of Present Illness:   [name] is a 84 y.o. female mild CAD, mild carotid artery disease, HTN, PAF on coumadin, dementia and HLD presents for palpitations.   Last seen by Dr. Acie Fredrickson 06/2016. DC metoprolol for bradycardia.  Here today for follow up. The patient has intermittent palpitation. IT occurs suddenly when she is anxious. Also while not anxious. Relieves without rest in 5-10 minutes. This is getting worse and more intense for the past few weeks. Now occurring multiple times/day. She has intermittent dizziness with and without palpitations. Denies any chest pain, dyspnea, orthopnea, PND, syncope, LE edema or melena.   Past Medical History:  Diagnosis Date  . Anxiety   . Atrophic vaginitis   . CAD (coronary artery disease) 2008/2010   Catheterization 2008, 40% LAD after first diagonal  /  nuclear January, 2010 normal  . Carotid artery disease ([place])    Doppler, July, 2011, 0-39% bilateral  . Decreased platelet count ([place]) 04/2011   142,000  . Endometrial polyp   . GERD (gastroesophageal reflux disease)   . Hyperlipidemia   . Hypertension   . Irritable bowel syndrome with constipation   . Memory difficulty 01-20-13   "memory issues"  . Normal nuclear stress test Feb 2013   No ischemia. Normal wall motion. EF greater than 70%  . Osteoarthritis   . Osteopenia   . Osteoporosis   . Palpitations    Monitor, May, 2013, no atrial fib,  one short run of PSVT  . Paroxysmal atrial fibrillation ([facility])    Coumadin therapy  . Polymyalgia rheumatica ([place])   . Spinal stenosis   . Stress   . Vitamin D deficiency     Past Surgical History:  Procedure Laterality Date  . BREAST SURGERY     BENIGN BREAST LUMP  . CATARACT SURGERY  2009  . COLONOSCOPY WITH PROPOFOL N/A 02/11/2013   Procedure: COLONOSCOPY WITH PROPOFOL;  Surgeon: [name], MD;  Location: [facility];  Service: Endoscopy;  Laterality: N/A;  . DILATION AND CURETTAGE OF UTERUS    . EXCISION MORTON'S NEUROMA  2000  . HIP SURGERY Right 2005   debridement  . HYSTEROSCOPY  02/07/2005   HYST W D&C, DX: PMB W SMALL ENDO POLYP-SURGEON [name]  . LAMINECTOMY  March 2009   L4 through L5  . TONSILLECTOMY      Current Medications: Prior to Admission medications   Medication Sig Start Date End Date Taking? Authorizing Provider  lisinopril (PRINIVIL,ZESTRIL) 5 MG tablet Take 5 mg by mouth daily.  09/12/13   [provider]  Melatonin 1 MG TABS Take 1 tablet by mouth at bedtime as needed.     [provider]  Memantine HCl-Donepezil HCl (NAMZARIC) 28-10 MG CP24 Take 1 capsule by mouth every morning. 07/10/16   Ward Givens, NP  warfarin (COUMADIN) 5 MG tablet TAKE TABLETS BY MOUTH AS DIRECTED BY ANTICOAGULATION CLINIC 10/04/16   Nahser, Wonda Cheng, MD  estradiol (ESTRACE) 1 MG tablet Take 1 mg by mouth as needed.   03/16/11  [provider]    Allergies:   Patient has no known allergies.   Social History   Socioeconomic History  . Marital status: Married    Spouse name: [name]  . Number of children: 2  .  Years of education: Masters  . Highest education level: None  Social Needs  . Financial resource strain: None  . Food insecurity - worry: None  . Food insecurity - inability: None  . Transportation needs - medical: None  . Transportation needs - non-medical: None  Occupational History  . Occupation: Retired    Fish farm manager: RETIRED  Tobacco Use  . Smoking status: Never Smoker  . Smokeless tobacco: Never Used  Substance and Sexual Activity  . Alcohol use: No  . Drug use: No  . Sexual activity: No    Birth control/protection: Post-menopausal  Other Topics Concern  . None  Social History Narrative   Patient is married [name]) and lives at home with her husband.    Patient has two children.   Retired; Married;   Patient is right-handed.   Patient drinks very little caffeine.   Patient has a Scientist, water quality.   Has had increased stress as her husband had a stroke ~ 3wks ago and she has been taking care of him. (04/07/11)     Family History:  The patient's family history includes Cancer in her father; Dementia in her sister; Diabetes in her sister; Heart disease in her brother and sister; Hypertension in her brother and sister; Pancreatitis in her father; Spina bifida in her brother; Stroke in her mother and sister; Transient ischemic attack in her mother.   ROS:   Please see the history of present illness.    ROS All other systems reviewed and are negative.   PHYSICAL EXAM:   VS:  BP 126/64   Pulse (!) 56   Ht 5\' 2"  (1.575 m)   Wt 95 lb 9.6 oz (43.4 kg)   SpO2 97%   BMI 17.49 kg/m    GEN: Thin frail female in no acute distress  HEENT: normal  Neck: no JVD, carotid bruits, or masses Cardiac: RRR; no murmurs, rubs, or gallops,no edema  Respiratory:  clear to auscultation bilaterally, normal work of breathing GI: soft, nontender, nondistended, + BS MS: no deformity or atrophy  Skin: warm and dry, no rash Neuro:  Alert and Oriented x 3, Strength and sensation are intact Psych: euthymic mood, full affect  Wt Readings from Last 3 Encounters:  04/17/17 95 lb 9.6 oz (43.4 kg)  01/08/17 96 lb (43.5 kg)  09/07/16 96 lb (43.5 kg)      Studies/Labs Reviewed:   EKG:  EKG is ordered today.  The ekg ordered today demonstrates SR at rate of 56 bpm  Recent Labs: 06/27/2016: ALT 35; BUN 17; Creatinine, Ser 0.81; Potassium 4.1; Sodium 144   Lipid Panel    Component Value Date/Time   CHOL 187 06/27/2016 0909   TRIG 91 06/27/2016 0909   HDL 75 06/27/2016 0909   CHOLHDL 2.5 06/27/2016 0909   CHOLHDL 3.2 06/22/2015 0825   VLDL 13 06/22/2015 0825   LDLCALC 94 06/27/2016 0909   LDLDIRECT 119.7 08/26/2009 1007    Additional studies/ records that  were reviewed today include:    Stress test 04/08/11 IMPRESSION:  1.  No reversible ischemia or infarction. 2.  Normal wall motion. 3.  Left ventricular ejection fraction equal greater than 70%    ASSESSMENT & PLAN:   1. CAD - mild by cath 2008. Low risk stress test 2013. No angina.   2. PAF/dizziness/palpitations/fatigue - Intermittent palpitations. Sometime exacerbated by anxiety. More intense and severe in past few weeks. Will check CBC, BMET and TSH. Continue Coumadin for anticoagulation. Concern for tachybrady syndrome given  fatigue and dizziness. Previously taken off BB due to bradycardia. She is able to perform all household work without any limitation despite dementia. She will be candidate for pacemker evaluation. Will get 48 hour monitor to evaluate baseline heart rate. She is not orthostatic by vitals today.  - Encouraged hydration.   3. Mild carotid artery disease by doppler 2011 - No bruit heard. If above work up negative consider doppler at follow up.  4. HTN - Stable on lisinopril.     Medication Adjustments/Labs and Tests Ordered: Current medicines are reviewed at length with the patient today.  Concerns regarding medicines are outlined above.  Medication changes, Labs and Tests ordered today are listed in the Patient Instructions below. Patient Instructions  Medication Instructions:  Your physician recommends that you continue on your current medications as directed. Please refer to the Current Medication list given to you today.   Labwork: TODAY:  BMET, CBC, & TSH  Testing/Procedures: Your physician has recommended that you wear a holter monitor. Holter monitors are medical devices that record the heart's electrical activity. Doctors most often use these monitors to diagnose arrhythmias. Arrhythmias are problems with the speed or rhythm of the heartbeat. The monitor is a small, portable device. You can wear one while you do your normal daily activities. This  is usually used to diagnose what is causing palpitations/syncope (passing out).   Follow-Up: Your physician recommends that you schedule a follow-up appointment in: 2-3 MONTHS WITH DR. Acie Fredrickson  Any Other Special Instructions Will Be Listed Below (If Applicable).  Holter Monitoring A Holter monitor is a small device that is used to detect abnormal heart rhythms. It clips to your clothing and is connected by wires to flat, sticky disks (electrodes) that attach to your chest. It is worn continuously for 24-48 hours. Follow these instructions at home:  Wear your Holter monitor at all times, even while exercising and sleeping, for as long as directed by your health care provider.  Make sure that the Holter monitor is safely clipped to your clothing or close to your body as recommended by your health care provider.  Do not get the monitor or wires wet.  Do not put body lotion or moisturizer on your chest.  Keep your skin clean.  Keep a diary of your daily activities, such as walking and doing chores. If you feel that your heartbeat is abnormal or that your heart is fluttering or skipping a beat: ? Record what you are doing when it happens. ? Record what time of day the symptoms occur.  Return your Holter monitor as directed by your health care provider.  Keep all follow-up visits as directed by your health care provider. This is important. Get help right away if:  You feel lightheaded or you faint.  You have trouble breathing.  You feel pain in your chest, upper arm, or jaw.  You feel sick to your stomach and your skin is pale, cool, or damp.  You heartbeat feels unusual or abnormal. This information is not intended to replace advice given to you by your health care provider. Make sure you discuss any questions you have with your health care provider. Document Released: 11/26/2003 Document Revised: 08/05/2015 Document Reviewed: 10/06/2013 Elsevier Interactive Patient Education   [employer].    If you need a refill on your cardiac medications before your next appointment, please call your pharmacy.      Jarrett Soho, PA  04/17/2017 10:16 AM    [facility] [address], [place], [place]  [id number] Phone: [phone]; Fax: [phone]

## 2017-04-17 ENCOUNTER — Ambulatory Visit (INDEPENDENT_AMBULATORY_CARE_PROVIDER_SITE_OTHER): Payer: Medicare Other | Admitting: Physician Assistant

## 2017-04-17 ENCOUNTER — Encounter: Payer: Self-pay | Admitting: Physician Assistant

## 2017-04-17 VITALS — BP 126/64 | HR 56 | Ht 62.0 in | Wt 95.6 lb

## 2017-04-17 DIAGNOSIS — I480 Paroxysmal atrial fibrillation: Secondary | ICD-10-CM

## 2017-04-17 DIAGNOSIS — R5383 Other fatigue: Secondary | ICD-10-CM

## 2017-04-17 DIAGNOSIS — I10 Essential (primary) hypertension: Secondary | ICD-10-CM | POA: Diagnosis not present

## 2017-04-17 DIAGNOSIS — R42 Dizziness and giddiness: Secondary | ICD-10-CM

## 2017-04-17 DIAGNOSIS — Z7901 Long term (current) use of anticoagulants: Secondary | ICD-10-CM | POA: Diagnosis not present

## 2017-04-17 DIAGNOSIS — I2510 Atherosclerotic heart disease of native coronary artery without angina pectoris: Secondary | ICD-10-CM

## 2017-04-17 DIAGNOSIS — R002 Palpitations: Secondary | ICD-10-CM

## 2017-04-17 LAB — BASIC METABOLIC PANEL
BUN / CREAT RATIO: 30 — AB (ref 12–28)
BUN: 25 mg/dL (ref 8–27)
CALCIUM: 9.3 mg/dL (ref 8.7–10.3)
CHLORIDE: 100 mmol/L (ref 96–106)
CO2: 25 mmol/L (ref 20–29)
Creatinine, Ser: 0.83 mg/dL (ref 0.57–1.00)
GFR, EST AFRICAN AMERICAN: 76 mL/min/{1.73_m2} (ref >59)
GFR, EST NON AFRICAN AMERICAN: 66 mL/min/{1.73_m2} (ref >59)
Glucose: 94 mg/dL (ref 65–99)
POTASSIUM: 4.3 mmol/L (ref 3.5–5.2)
SODIUM: 140 mmol/L (ref 134–144)

## 2017-04-17 LAB — CBC
HEMATOCRIT: 43.4 % (ref 34.0–46.6)
Hemoglobin: 14.8 g/dL (ref 11.1–15.9)
MCH: 32.7 pg (ref 26.6–33.0)
MCHC: 34.1 g/dL (ref 31.5–35.7)
MCV: 96 fL (ref 79–97)
PLATELETS: 197 10*3/uL (ref 150–379)
RBC: 4.53 x10E6/uL (ref 3.77–5.28)
RDW: 13.5 % (ref 12.3–15.4)
WBC: 12.5 10*3/uL — AB (ref 3.4–10.8)

## 2017-04-17 LAB — TSH: TSH: 1.74 u[IU]/mL (ref 0.450–4.500)

## 2017-04-17 NOTE — Patient Instructions (Addendum)
Medication Instructions:  Your physician recommends that you continue on your current medications as directed. Please refer to the Current Medication list given to you today.   Labwork: TODAY:  BMET, CBC, & TSH  Testing/Procedures: Your physician has recommended that you wear a holter monitor. Holter monitors are medical devices that record the heart's electrical activity. Doctors most often use these monitors to diagnose arrhythmias. Arrhythmias are problems with the speed or rhythm of the heartbeat. The monitor is a small, portable device. You can wear one while you do your normal daily activities. This is usually used to diagnose what is causing palpitations/syncope (passing out).   Follow-Up: Your physician recommends that you schedule a follow-up appointment in: 2-3 MONTHS WITH DR. Acie Fredrickson  Any Other Special Instructions Will Be Listed Below (If Applicable).  Holter Monitoring A Holter monitor is a small device that is used to detect abnormal heart rhythms. It clips to your clothing and is connected by wires to flat, sticky disks (electrodes) that attach to your chest. It is worn continuously for 24-48 hours. Follow these instructions at home:  Wear your Holter monitor at all times, even while exercising and sleeping, for as long as directed by your health care provider.  Make sure that the Holter monitor is safely clipped to your clothing or close to your body as recommended by your health care provider.  Do not get the monitor or wires wet.  Do not put body lotion or moisturizer on your chest.  Keep your skin clean.  Keep a diary of your daily activities, such as walking and doing chores. If you feel that your heartbeat is abnormal or that your heart is fluttering or skipping a beat: ? Record what you are doing when it happens. ? Record what time of day the symptoms occur.  Return your Holter monitor as directed by your health care provider.  Keep all follow-up visits as  directed by your health care provider. This is important. Get help right away if:  You feel lightheaded or you faint.  You have trouble breathing.  You feel pain in your chest, upper arm, or jaw.  You feel sick to your stomach and your skin is pale, cool, or damp.  You heartbeat feels unusual or abnormal. This information is not intended to replace advice given to you by your health care provider. Make sure you discuss any questions you have with your health care provider. Document Released: 11/26/2003 Document Revised: 08/05/2015 Document Reviewed: 10/06/2013 Elsevier Interactive Patient Education  [employer].    If you need a refill on your cardiac medications before your next appointment, please call your pharmacy.

## 2017-04-19 DIAGNOSIS — E060 Acute thyroiditis: Secondary | ICD-10-CM | POA: Diagnosis not present

## 2017-04-19 DIAGNOSIS — R070 Pain in throat: Secondary | ICD-10-CM | POA: Diagnosis not present

## 2017-04-20 ENCOUNTER — Telehealth: Payer: Self-pay | Admitting: Cardiovascular Disease

## 2017-04-20 NOTE — Telephone Encounter (Signed)
New message    Patient spouse calling, wants to know if warfarin (COUMADIN) 5 MG tablet needs to adjusted while patient on Predisone. Please call

## 2017-04-20 NOTE — Telephone Encounter (Signed)
Spoke with patient's husband and advised that adjustment of warfarin is not needed with prednisone. Husband verbalized understanding and thanked me for the call.

## 2017-04-23 ENCOUNTER — Other Ambulatory Visit: Payer: Self-pay | Admitting: Cardiovascular Disease

## 2017-04-23 ENCOUNTER — Ambulatory Visit (INDEPENDENT_AMBULATORY_CARE_PROVIDER_SITE_OTHER): Payer: Medicare Other

## 2017-04-23 DIAGNOSIS — R5383 Other fatigue: Secondary | ICD-10-CM | POA: Diagnosis not present

## 2017-04-23 DIAGNOSIS — R002 Palpitations: Secondary | ICD-10-CM | POA: Diagnosis not present

## 2017-04-23 DIAGNOSIS — I480 Paroxysmal atrial fibrillation: Secondary | ICD-10-CM | POA: Diagnosis not present

## 2017-04-26 DIAGNOSIS — E060 Acute thyroiditis: Secondary | ICD-10-CM | POA: Diagnosis not present

## 2017-05-03 ENCOUNTER — Ambulatory Visit (INDEPENDENT_AMBULATORY_CARE_PROVIDER_SITE_OTHER): Payer: Medicare Other | Admitting: *Deleted

## 2017-05-03 DIAGNOSIS — I480 Paroxysmal atrial fibrillation: Secondary | ICD-10-CM | POA: Diagnosis not present

## 2017-05-03 DIAGNOSIS — Z5181 Encounter for therapeutic drug level monitoring: Secondary | ICD-10-CM | POA: Diagnosis not present

## 2017-05-03 LAB — POCT INR: INR: 4.9

## 2017-05-03 NOTE — Patient Instructions (Addendum)
Description   Skip tomorrow and Saturday's dose, on Sunday only take 1 tablet, then Continue on same dosage 1.5 tablets daily except 1 tablet on Tuesday and Saturdays. Recheck in 11 days. Call us with any medication changes or concerns # [phone] [facility].

## 2017-05-14 ENCOUNTER — Ambulatory Visit (INDEPENDENT_AMBULATORY_CARE_PROVIDER_SITE_OTHER): Payer: Medicare Other | Admitting: Pharmacist

## 2017-05-14 DIAGNOSIS — I480 Paroxysmal atrial fibrillation: Secondary | ICD-10-CM

## 2017-05-14 DIAGNOSIS — Z5181 Encounter for therapeutic drug level monitoring: Secondary | ICD-10-CM | POA: Diagnosis not present

## 2017-05-14 LAB — POCT INR: INR: 3.6

## 2017-05-14 NOTE — Patient Instructions (Addendum)
Hold tomorrow's dose, then continue on same dosage 1.5 tablets daily except 1 tablet on Tuesday and Saturdays. Recheck in 2 weeks. Call us with any medication changes or concerns # [phone] [facility].

## 2017-05-21 DIAGNOSIS — K219 Gastro-esophageal reflux disease without esophagitis: Secondary | ICD-10-CM | POA: Diagnosis not present

## 2017-05-21 DIAGNOSIS — J029 Acute pharyngitis, unspecified: Secondary | ICD-10-CM | POA: Diagnosis not present

## 2017-05-23 DIAGNOSIS — H353211 Exudative age-related macular degeneration, right eye, with active choroidal neovascularization: Secondary | ICD-10-CM | POA: Diagnosis not present

## 2017-05-23 DIAGNOSIS — H353122 Nonexudative age-related macular degeneration, left eye, intermediate dry stage: Secondary | ICD-10-CM | POA: Diagnosis not present

## 2017-05-28 ENCOUNTER — Ambulatory Visit (INDEPENDENT_AMBULATORY_CARE_PROVIDER_SITE_OTHER): Payer: Medicare Other | Admitting: *Deleted

## 2017-05-28 DIAGNOSIS — Z5181 Encounter for therapeutic drug level monitoring: Secondary | ICD-10-CM | POA: Diagnosis not present

## 2017-05-28 DIAGNOSIS — I480 Paroxysmal atrial fibrillation: Secondary | ICD-10-CM | POA: Diagnosis not present

## 2017-05-28 LAB — POCT INR: INR: 3.5

## 2017-05-28 NOTE — Patient Instructions (Signed)
Description   Do not take coumadin today March 18th then change coumadin dose to  1.5 tablets daily except 1 tablet on Tuesday Thursdays  and Saturdays. Recheck in 2 weeks. Call us with any medication changes or concerns # [phone] [facility].

## 2017-06-01 DIAGNOSIS — K219 Gastro-esophageal reflux disease without esophagitis: Secondary | ICD-10-CM | POA: Diagnosis not present

## 2017-06-01 DIAGNOSIS — Z79899 Other long term (current) drug therapy: Secondary | ICD-10-CM | POA: Diagnosis not present

## 2017-06-01 DIAGNOSIS — E46 Unspecified protein-calorie malnutrition: Secondary | ICD-10-CM | POA: Diagnosis not present

## 2017-06-01 DIAGNOSIS — I10 Essential (primary) hypertension: Secondary | ICD-10-CM | POA: Diagnosis not present

## 2017-06-01 DIAGNOSIS — I482 Chronic atrial fibrillation: Secondary | ICD-10-CM | POA: Diagnosis not present

## 2017-06-01 DIAGNOSIS — G301 Alzheimer's disease with late onset: Secondary | ICD-10-CM | POA: Diagnosis not present

## 2017-06-01 DIAGNOSIS — J309 Allergic rhinitis, unspecified: Secondary | ICD-10-CM | POA: Diagnosis not present

## 2017-06-11 ENCOUNTER — Ambulatory Visit (INDEPENDENT_AMBULATORY_CARE_PROVIDER_SITE_OTHER): Payer: Medicare Other | Admitting: *Deleted

## 2017-06-11 DIAGNOSIS — J029 Acute pharyngitis, unspecified: Secondary | ICD-10-CM | POA: Diagnosis not present

## 2017-06-11 DIAGNOSIS — Z5181 Encounter for therapeutic drug level monitoring: Secondary | ICD-10-CM | POA: Diagnosis not present

## 2017-06-11 DIAGNOSIS — I480 Paroxysmal atrial fibrillation: Secondary | ICD-10-CM

## 2017-06-11 LAB — POCT INR: INR: 3.6

## 2017-06-11 NOTE — Patient Instructions (Signed)
Description   Do not take coumadin today then change coumadin dose to 1 tablet daily except 1.5 tablets Mondays, Wednesdays, and Fridays.  Recheck in 2 weeks. Call us with any medication changes or concerns # [phone] [facility].

## 2017-06-25 ENCOUNTER — Ambulatory Visit (INDEPENDENT_AMBULATORY_CARE_PROVIDER_SITE_OTHER): Payer: Medicare Other | Admitting: *Deleted

## 2017-06-25 DIAGNOSIS — I480 Paroxysmal atrial fibrillation: Secondary | ICD-10-CM | POA: Diagnosis not present

## 2017-06-25 DIAGNOSIS — Z5181 Encounter for therapeutic drug level monitoring: Secondary | ICD-10-CM

## 2017-06-25 LAB — POCT INR: INR: 1.7

## 2017-06-25 NOTE — Patient Instructions (Signed)
Description   Today April 15th take 2 tablets then continue same dose of  coumadin dose 1 tablet daily except 1.5 tablets Mondays, Wednesdays, and Fridays.  Recheck in 2 weeks. Call us with any medication changes or concerns # [phone] [facility].

## 2017-07-03 ENCOUNTER — Encounter: Payer: Self-pay | Admitting: Cardiovascular Disease

## 2017-07-09 ENCOUNTER — Ambulatory Visit (INDEPENDENT_AMBULATORY_CARE_PROVIDER_SITE_OTHER): Payer: Medicare Other | Admitting: Adult Health

## 2017-07-09 ENCOUNTER — Encounter: Payer: Self-pay | Admitting: Adult Health

## 2017-07-09 DIAGNOSIS — I2510 Atherosclerotic heart disease of native coronary artery without angina pectoris: Secondary | ICD-10-CM

## 2017-07-09 DIAGNOSIS — G309 Alzheimer's disease, unspecified: Secondary | ICD-10-CM

## 2017-07-09 DIAGNOSIS — F0280 Dementia in other diseases classified elsewhere without behavioral disturbance: Secondary | ICD-10-CM

## 2017-07-09 MED ORDER — MEMANTINE HCL-DONEPEZIL HCL ER 28-10 MG PO CP24: 1.0000 | ORAL_CAPSULE | Freq: Every morning | ORAL | 3 refills | Status: DC

## 2017-07-09 NOTE — Progress Notes (Signed)
PATIENT: [name] DOB: 09/09/1933  REASON FOR VISIT: follow up HISTORY FROM: patient  HISTORY OF PRESENT ILLNESS: Today 07/09/17 [name] is an 84 year old female with a history of memory disturbance.  She returns today for follow-up.  She is currently on Namzaric and continues to tolerate it well.  She does feel that her memory may be slightly worse.  She has noticed that she may wake up in the morning and decide what she wants to do for the day.  She states by midday she has forgotten what she wanted to do.  She lives at home with her husband.  She is able to complete all ADLs independently.  She does operate a motor vehicle but only drives to familiar places and does not drive after dark.  Reports good appetite.  She states that she sleeps fairly well but tends to wake up in the middle the night.  She reports on occasion she will  take over-the-counter sleep medication.  She denies hallucinations.  Reports that she is able to manage her finances.  However her husband does most of the bill paying.  She states that if she goes out to buy groceries she is able to pay and make the appropriate change.  She continues to be very social she reports.  Reports that she continues to Pele the piano regularly.  She returns today for evaluation.  HISTORY 01/08/2017. I have pleasure of seeing [name] today for a memory follow-up. The patient reports that she feels that her mind is a little foggy these days, she does not feel perfectly normal and clean. When she wakes up in the morning she has to think hard which day of the month it is and which day of the week. She has remained true to some of her social activities but has left other groups she used to participate in. She has a more difficult time to understand instructions, feels this is independent of time of day, food intake and hydration. She feels forgetful. She writes more things down, keeps lists.   REVIEW OF SYSTEMS: Out of a  complete 14 system review of symptoms, the patient complains only of the following symptoms, and all other reviewed systems are negative.  Memory loss, runny nose, palpitations, frequent waking  ALLERGIES: No Known Allergies  HOME MEDICATIONS: Outpatient Medications Prior to Visit  Medication Sig Dispense Refill  . Cholecalciferol (VITAMIN D3) 1000 units CAPS Take 1,000 Units by mouth daily.    [name] lisinopril (PRINIVIL,ZESTRIL) 5 MG tablet Take 5 mg by mouth daily.     . LUTEIN PO Take by mouth. Take 1 tablet by mouth every Tue, [place], Sat.    . Melatonin 1 MG TABS Take 1 tablet by mouth at bedtime as needed.     . Memantine HCl-Donepezil HCl (NAMZARIC) 28-10 MG CP24 Take 1 capsule by mouth every morning. 90 capsule 3  . Omega-3 Fatty Acids (FISH OIL PO) Take 1 tablet by mouth. Take 1 tablet by mouth every Mon, Wed, Fri.    . warfarin (COUMADIN) 5 MG tablet TAKE TABLETS BY MOUTH AS DIRECTED BY ANTICOAGULATION CLINIC 135 tablet 1  . OVER THE COUNTER MEDICATION Take one teaspoon of Cacao power by mouth daily with coffee or food    . predniSONE (DELTASONE) 10 MG tablet Take by mouth. Take 4 tablets by mouth daily for 5 days then decease by one tablet every two days. Taper     No facility-administered medications prior to visit.  PAST MEDICAL HISTORY: Past Medical History:  Diagnosis Date  . Anxiety   . Atrophic vaginitis   . CAD (coronary artery disease) 2008/2010   Catheterization 2008, 40% LAD after first diagonal  /  nuclear January, 2010 normal  . Carotid artery disease ([place])    Doppler, July, 2011, 0-39% bilateral  . Decreased platelet count ([place]) 04/2011   142,000  . Endometrial polyp   . GERD (gastroesophageal reflux disease)   . Hyperlipidemia   . Hypertension   . Irritable bowel syndrome with constipation   . Memory difficulty 01-20-13   "memory issues"  . Normal nuclear stress test Feb 2013   No ischemia. Normal wall motion. EF greater than 70%  . Osteoarthritis   .  Osteopenia   . Osteoporosis   . Palpitations    Monitor, May, 2013, no atrial fib,  one short run of PSVT  . Paroxysmal atrial fibrillation (HCC)    Coumadin therapy  . Polymyalgia rheumatica ([place])   . Spinal stenosis   . Stress   . Vitamin D deficiency     PAST SURGICAL HISTORY: Past Surgical History:  Procedure Laterality Date  . BREAST SURGERY     BENIGN BREAST LUMP  . CATARACT SURGERY  2009  . COLONOSCOPY WITH PROPOFOL N/A 02/11/2013   Procedure: COLONOSCOPY WITH PROPOFOL;  Surgeon: [name], MD;  Location: [facility];  Service: Endoscopy;  Laterality: N/A;  . DILATION AND CURETTAGE OF UTERUS    . EXCISION MORTON'S NEUROMA  2000  . HIP SURGERY Right 2005   debridement  . HYSTEROSCOPY  02/07/2005   HYST W D&C, DX: PMB W SMALL ENDO POLYP-SURGEON [name]  . LAMINECTOMY  March 2009   L4 through L5  . TONSILLECTOMY      FAMILY HISTORY: Family History  Problem Relation Age of Onset  . Transient ischemic attack Mother   . Stroke Mother   . Cancer Father        PROSTATE  . Pancreatitis Father   . Stroke Sister   . Diabetes Sister   . Hypertension Sister   . Heart disease Sister   . Dementia Sister   . Spina bifida Brother   . Hypertension Brother   . Heart disease Brother     SOCIAL HISTORY: Social History   Socioeconomic History  . Marital status: Married    Spouse name: [name]  . Number of children: 2  . Years of education: Masters  . Highest education level: Not on file  Occupational History  . Occupation: Retired    Fish farm manager: RETIRED  Social Needs  . Financial resource strain: Not on file  . Food insecurity:    Worry: Not on file    Inability: Not on file  . Transportation needs:    Medical: Not on file    Non-medical: Not on file  Tobacco Use  . Smoking status: Never Smoker  . Smokeless tobacco: Never Used  Substance and Sexual Activity  . Alcohol use: No  . Drug use: No  . Sexual activity: Never    Birth control/protection:  Post-menopausal  Lifestyle  . Physical activity:    Days per week: Not on file    Minutes per session: Not on file  . Stress: Not on file  Relationships  . Social connections:    Talks on phone: Not on file    Gets together: Not on file    Attends religious service: Not on file    Active member of club or organization:  Not on file    Attends meetings of clubs or organizations: Not on file    Relationship status: Not on file  . Intimate partner violence:    Fear of current or ex partner: Not on file    Emotionally abused: Not on file    Physically abused: Not on file    Forced sexual activity: Not on file  Other Topics Concern  . Not on file  Social History Narrative   Patient is married [name]) and lives at home with her husband.   Patient has two children.   Retired; Married;   Patient is right-handed.   Patient drinks very little caffeine.   Patient has a Scientist, water quality.   Has had increased stress as her husband had a stroke ~ 3wks ago and she has been taking care of him. (04/07/11)      PHYSICAL EXAM  Vitals:   07/09/17 0816  BP: (!) 118/53  Pulse: (!) 53  Weight: 103 lb (46.7 kg)  Height: 5\' 2"  (1.575 m)   Body mass index is 18.84 kg/m.   MMSE - Mini Mental State Exam 07/09/2017 01/08/2017 09/07/2016  Orientation to time 3 3 3   Orientation to Place 3 3 3   Registration 3 3 3   Attention/ Calculation 1 3 3   Recall 0 0 0  Language- name 2 objects 2 2 2   Language- repeat 1 1 1   Language- follow 3 step command 2 3 3   Language- follow 3 step command-comments took w/L hand, switched to R hand - -  Language- read & follow direction 1 1 1   Write a sentence 1 1 1   Copy design 0 1 1  Total score 17 21 21     Generalized: Well developed, in no acute distress   Neurological examination  Mentation: Alert oriented to time, place, history taking. Follows all commands speech and language fluent Cranial nerve II-XII: Pupils were equal round reactive to light. Extraocular  movements were full, visual field were full on confrontational test. Facial sensation and strength were normal. Uvula tongue midline. Head turning and shoulder shrug  were normal and symmetric. Motor: The motor testing reveals 5 over 5 strength of all 4 extremities. Good symmetric motor tone is noted throughout.  Sensory: Sensory testing is intact to soft touch on all 4 extremities. No evidence of extinction is noted.  Coordination: Cerebellar testing reveals good finger-nose-finger and heel-to-shin bilaterally.  Gait and station: Gait is normal. Tandem gait is normal. Romberg is negative. No drift is seen.  Reflexes: Deep tendon reflexes are symmetric and normal bilaterally.   DIAGNOSTIC DATA (LABS, IMAGING, TESTING) - I reviewed patient records, labs, notes, testing and imaging myself where available.  Lab Results  Component Value Date   WBC 12.5 (H) 04/17/2017   HGB 14.8 04/17/2017   HCT 43.4 04/17/2017   MCV 96 04/17/2017   PLT 197 04/17/2017      Component Value Date/Time   NA 140 04/17/2017 1035   K 4.3 04/17/2017 1035   CL 100 04/17/2017 1035   CO2 25 04/17/2017 1035   GLUCOSE 94 04/17/2017 1035   GLUCOSE 158 (H) 06/05/2015 0854   BUN 25 04/17/2017 1035   CREATININE 0.83 04/17/2017 1035   CALCIUM 9.3 04/17/2017 1035   PROT 5.8 (L) 06/27/2016 0909   ALBUMIN 4.0 06/27/2016 0909   AST 31 06/27/2016 0909   ALT 35 (H) 06/27/2016 0909   ALKPHOS 55 06/27/2016 0909   BILITOT 0.5 06/27/2016 0909   GFRNONAA 66 04/17/2017 1035  GFRAA 76 04/17/2017 1035   Lab Results  Component Value Date   CHOL 187 06/27/2016   HDL 75 06/27/2016   LDLCALC 94 06/27/2016   LDLDIRECT 119.7 08/26/2009   TRIG 91 06/27/2016   CHOLHDL 2.5 06/27/2016   No results found for: HGBA1C No results found for: VITAMINB12 Lab Results  Component Value Date   TSH 1.740 04/17/2017      ASSESSMENT AND PLAN 84 y.o. year old female  has a past medical history of Anxiety, Atrophic vaginitis, CAD  (coronary artery disease) (2008/2010), Carotid artery disease ([place]), Decreased platelet count ([place]) (04/2011), Endometrial polyp, GERD (gastroesophageal reflux disease), Hyperlipidemia, Hypertension, Irritable bowel syndrome with constipation, Memory difficulty (01-20-13), Normal nuclear stress test (Feb 2013), Osteoarthritis, Osteopenia, Osteoporosis, Palpitations, Paroxysmal atrial fibrillation ([place]), Polymyalgia rheumatica ([place]), Spinal stenosis, Stress, and Vitamin D deficiency. here with:  1.  Memory disturbance  The patient's memory score has slightly declined since the last visit.  She is encouraged to continue on Namzaric.  I have encouraged her to continue to participate in social functions, continue playing the piano and doing other things such as word puzzles and word searches.  She voiced understanding.  She is advised that if her symptoms worsen or she develops she should let us know.  She will follow-up in 6 months or sooner if needed.   Ward Givens, MSN, NP-C 07/09/2017, 8:31 AM [facility] [address], [place], [place] [address] [phone]  I spent 15 minutes with the patient 50% of this time was spent reviewing her memory score.

## 2017-07-09 NOTE — Patient Instructions (Signed)
Your Plan:  Continue Namzaric Memory score slightly declined. We will continue to monitor Continue with social functions, playing the piano, word puzzles and word searches If your symptoms worsen or you develop new symptoms please let us know.       Thank you for coming to see [place] at [facility]. I hope we have been able to provide you high quality care today.  You may receive a patient satisfaction survey over the next few weeks. We would appreciate your feedback and comments so that we may continue to improve ourselves and the health of our patients.

## 2017-07-09 NOTE — Progress Notes (Signed)
I agree with the assessment and plan as directed by NP .The patient is known to me .   Fahima Cifelli, MD  

## 2017-07-12 ENCOUNTER — Ambulatory Visit (INDEPENDENT_AMBULATORY_CARE_PROVIDER_SITE_OTHER): Payer: Medicare Other | Admitting: Cardiovascular Disease

## 2017-07-12 ENCOUNTER — Ambulatory Visit (INDEPENDENT_AMBULATORY_CARE_PROVIDER_SITE_OTHER): Payer: Medicare Other | Admitting: *Deleted

## 2017-07-12 ENCOUNTER — Encounter: Payer: Self-pay | Admitting: Cardiovascular Disease

## 2017-07-12 VITALS — BP 120/58 | HR 55 | Ht 62.0 in | Wt 100.8 lb

## 2017-07-12 DIAGNOSIS — I480 Paroxysmal atrial fibrillation: Secondary | ICD-10-CM | POA: Diagnosis not present

## 2017-07-12 DIAGNOSIS — I2510 Atherosclerotic heart disease of native coronary artery without angina pectoris: Secondary | ICD-10-CM | POA: Diagnosis not present

## 2017-07-12 DIAGNOSIS — Z5181 Encounter for therapeutic drug level monitoring: Secondary | ICD-10-CM

## 2017-07-12 LAB — POCT INR: INR: 1.2

## 2017-07-12 NOTE — Patient Instructions (Signed)
Description   Today take 1.5 tablets, tomorrow take 2 tablets, then continue same dose of  coumadin dose 1 tablet daily except 1.5 tablets Mondays, Wednesdays, and Fridays.  Recheck in 8 days. Call us with any medication changes or concerns # [phone] [facility].

## 2017-07-12 NOTE — Patient Instructions (Signed)
Medication Instructions:  Your physician recommends that you continue on your current medications as directed. Please refer to the Current Medication list given to you today.   Labwork: None Ordered   Testing/Procedures: None Ordered   Follow-Up: Your physician wants you to follow-up in: 1 year with Dr. Nahser.  You will receive a reminder letter in the mail two months in advance. If you don't receive a letter, please call our office to schedule the follow-up appointment.   If you need a refill on your cardiac medications before your next appointment, please call your pharmacy.   Thank you for choosing [facility]! Lauria Depoy, RN [phone]    

## 2017-07-12 NOTE — Progress Notes (Signed)
Cardiology Office Note   Date:  07/12/2017   ID:  [name], DOB [birth year], MRN [id number]  PCP:  Lajean Manes, MD  Cardiologist:   Mertie Moores, MD ( transitioning from Dr. Ron Parker)   Chief Complaint  Patient presents with  . Coronary Artery Disease   Problem list 1. Mild coronary artery disease 2. Mild carotid artery disease 3. Paroxysmal atrial fibrillation-on Coumadin 4. Essential hypertension 5. Hyperlipidemia    [name] is a 84 y.o. female who presents to establish care. She is a previous patient of Dr. Ron Parker. She has a history of mild coronary artery disease, carotid artery disease. She admits to having some memory problems. She has paroxysmal atrial fibrillation and is on Coumadin.     HR is slow,  she denies having any syncope or presyncope. She does all of her typical household chores without any difficulty . She exercises several times a week.  June 22, 2015:  [name] is doing well.  Told me that she has some dementia.   June 27, 2016:  Seen back today for PAF, CAD  and palpitations  Exercising regularly  Has moved into [facility]  Jul 12, 2017:  [name] is seen today   no CP .   HR is variable.    Has a hx of PAF  - on couamdin  Dx with alzheimers 2 years ago    .  Slowly worsening   Past Medical History:  Diagnosis Date  . Anxiety   . Atrophic vaginitis   . CAD (coronary artery disease) 2008/2010   Catheterization 2008, 40% LAD after first diagonal  /  nuclear January, 2010 normal  . Carotid artery disease ([name])    Doppler, July, 2011, 0-39% bilateral  . Decreased platelet count ([name]) 04/2011   142,000  . Endometrial polyp   . GERD (gastroesophageal reflux disease)   . Hyperlipidemia   . Hypertension   . Irritable bowel syndrome with constipation   . Memory difficulty 01-20-13   "memory issues"  . Normal nuclear stress test Feb 2013   No ischemia. Normal wall motion. EF greater than 70%  . Osteoarthritis   . Osteopenia    . Osteoporosis   . Palpitations    Monitor, May, 2013, no atrial fib,  one short run of PSVT  . Paroxysmal atrial fibrillation (HCC)    Coumadin therapy  . Polymyalgia rheumatica ([place])   . Spinal stenosis   . Stress   . Vitamin D deficiency     Past Surgical History:  Procedure Laterality Date  . BREAST SURGERY     BENIGN BREAST LUMP  . CATARACT SURGERY  2009  . COLONOSCOPY WITH PROPOFOL N/A 02/11/2013   Procedure: COLONOSCOPY WITH PROPOFOL;  Surgeon: [name], MD;  Location: [facility];  Service: Endoscopy;  Laterality: N/A;  . DILATION AND CURETTAGE OF UTERUS    . EXCISION MORTON'S NEUROMA  2000  . HIP SURGERY Right 2005   debridement  . HYSTEROSCOPY  02/07/2005   HYST W D&C, DX: PMB W SMALL ENDO POLYP-SURGEON [name]  . LAMINECTOMY  March 2009   L4 through L5  . TONSILLECTOMY       Current Outpatient Medications  Medication Sig Dispense Refill  . Cholecalciferol (VITAMIN D3) 1000 units CAPS Take 1,000 Units by mouth daily.    [name] lisinopril (PRINIVIL,ZESTRIL) 5 MG tablet Take 5 mg by mouth daily.     . LUTEIN PO Take by mouth. Take 1 tablet by mouth  every Tue, Thur, Sat.    . Melatonin 1 MG TABS Take 1 tablet by mouth at bedtime as needed.     . Memantine HCl-Donepezil HCl (NAMZARIC) 28-10 MG CP24 Take 1 capsule by mouth every morning. 90 capsule 3  . Omega-3 Fatty Acids (FISH OIL PO) Take 1 tablet by mouth. Take 1 tablet by mouth every Mon, Wed, Fri.    . OVER THE COUNTER MEDICATION Take one teaspoon of [place] power by mouth daily with coffee or food    . warfarin (COUMADIN) 5 MG tablet TAKE TABLETS BY MOUTH AS DIRECTED BY ANTICOAGULATION CLINIC 135 tablet 1   No current facility-administered medications for this visit.     Allergies:   Patient has no known allergies.    Social History:  The patient  reports that she has never smoked. She has never used smokeless tobacco. She reports that she does not drink alcohol or use drugs.   Family History:  The  patient's family history includes Cancer in her father; Dementia in her sister; Diabetes in her sister; Heart disease in her brother and sister; Hypertension in her brother and sister; Pancreatitis in her father; Spina bifida in her brother; Stroke in her mother and sister; Transient ischemic attack in her mother.    ROS:    Noted in current hx., otherwise negativ eNoted in current history, otherwise review systems is negative.   Physical Exam: Blood pressure (!) 120/58, pulse (!) 55, height 5\' 2"  (1.575 m), weight 100 lb 12.8 oz (45.7 kg), SpO2 99 %.  GEN:  Well nourished, well developed in no acute distress HEENT: Normal NECK: No JVD; No carotid bruits LYMPHATICS: No lymphadenopathy CARDIAC: RR, no murmurs, rubs, gallops RESPIRATORY:  Clear to auscultation without rales, wheezing or rhonchi  ABDOMEN: Soft, non-tender, non-distended MUSCULOSKELETAL:  No edema; No deformity  SKIN: Warm and dry NEUROLOGIC:  Alert and oriented x 3   EKG:        Recent Labs: 04/17/2017: BUN 25; Creatinine, Ser 0.83; Hemoglobin 14.8; Platelets 197; Potassium 4.3; Sodium 140; TSH 1.740    Lipid Panel    Component Value Date/Time   CHOL 187 06/27/2016 0909   TRIG 91 06/27/2016 0909   HDL 75 06/27/2016 0909   CHOLHDL 2.5 06/27/2016 0909   CHOLHDL 3.2 06/22/2015 0825   VLDL 13 06/22/2015 0825   LDLCALC 94 06/27/2016 0909   LDLDIRECT 119.7 08/26/2009 1007      Wt Readings from Last 3 Encounters:  07/12/17 100 lb 12.8 oz (45.7 kg)  07/09/17 103 lb (46.7 kg)  04/17/17 95 lb 9.6 oz (43.4 kg)      Other studies Reviewed: Additional studies/ records that were reviewed today include: . Review of the above records demonstrates:    ASSESSMENT AND PLAN:  1. Mild coronary artery disease: Khristie is doing well. She has not had any episodes of chest pain  3. Paroxysmal atrial fibrillation:  INR levels have been very difficult to control Offered Eliquis ( husband does not like because of lack of  reversal agent) offered Pradaxa - he didn't want that either. Continue meds.   4. Essential hypertension:   BP is well controlled. Continue current medications.   Current medicines are reviewed at length with the patient today.  The patient does not have concerns regarding medicines.  The following changes have been made:  no change  Labs/ tests ordered today include:   No orders of the defined types were placed in this encounter.    Disposition:  FU with me in 1 year     Mertie Moores, MD  07/12/2017 4:40 PM    [facility] [place], [place], [place]  [address] Phone: [phone]; Fax: [phone]

## 2017-07-20 ENCOUNTER — Ambulatory Visit (INDEPENDENT_AMBULATORY_CARE_PROVIDER_SITE_OTHER): Payer: Medicare Other

## 2017-07-20 DIAGNOSIS — I480 Paroxysmal atrial fibrillation: Secondary | ICD-10-CM | POA: Diagnosis not present

## 2017-07-20 DIAGNOSIS — Z5181 Encounter for therapeutic drug level monitoring: Secondary | ICD-10-CM

## 2017-07-20 LAB — POCT INR: INR: 3.2

## 2017-07-20 NOTE — Patient Instructions (Signed)
Description   Take 1/2 tablet today, then resume same dosage of Coumadin 1 tablet daily except 1.5 tablets Mondays, Wednesdays, and Fridays.  Recheck in 2 weeks. Call us with any medication changes or concerns # [phone] [facility].

## 2017-07-24 DIAGNOSIS — H353211 Exudative age-related macular degeneration, right eye, with active choroidal neovascularization: Secondary | ICD-10-CM | POA: Diagnosis not present

## 2017-07-31 DIAGNOSIS — K219 Gastro-esophageal reflux disease without esophagitis: Secondary | ICD-10-CM | POA: Diagnosis not present

## 2017-07-31 DIAGNOSIS — I10 Essential (primary) hypertension: Secondary | ICD-10-CM | POA: Diagnosis not present

## 2017-07-31 DIAGNOSIS — R42 Dizziness and giddiness: Secondary | ICD-10-CM | POA: Diagnosis not present

## 2017-08-03 ENCOUNTER — Ambulatory Visit (INDEPENDENT_AMBULATORY_CARE_PROVIDER_SITE_OTHER): Payer: Medicare Other

## 2017-08-03 DIAGNOSIS — Z5181 Encounter for therapeutic drug level monitoring: Secondary | ICD-10-CM | POA: Diagnosis not present

## 2017-08-03 DIAGNOSIS — I480 Paroxysmal atrial fibrillation: Secondary | ICD-10-CM | POA: Diagnosis not present

## 2017-08-03 LAB — POCT INR: INR: 3.8 — AB (ref 2.0–3.0)

## 2017-08-03 NOTE — Patient Instructions (Signed)
Description   Skip today's dosage of Coumadin, then start taking 1 tablet daily except 1.5 tablets Mondays and Fridays.  Recheck in 2 weeks. Call us with any medication changes or concerns # [phone] [facility].

## 2017-08-17 ENCOUNTER — Ambulatory Visit (INDEPENDENT_AMBULATORY_CARE_PROVIDER_SITE_OTHER): Payer: Medicare Other | Admitting: *Deleted

## 2017-08-17 DIAGNOSIS — I480 Paroxysmal atrial fibrillation: Secondary | ICD-10-CM | POA: Diagnosis not present

## 2017-08-17 DIAGNOSIS — Z5181 Encounter for therapeutic drug level monitoring: Secondary | ICD-10-CM

## 2017-08-17 LAB — POCT INR: INR: 2.9 (ref 2.0–3.0)

## 2017-08-17 NOTE — Patient Instructions (Signed)
Description   Continue same dose of coumadin  1 tablet daily except 1.5 tablets Mondays and Fridays.  Recheck in 3 weeks. Call us with any medication changes or concerns # [phone] [facility].

## 2017-08-22 ENCOUNTER — Ambulatory Visit (HOSPITAL_COMMUNITY)
Admission: EM | Admit: 2017-08-22 | Discharge: 2017-08-22 | Disposition: A | Discharge status: Home / Self Care | Payer: 59 | Attending: Family Medicine | Admitting: Family Medicine

## 2017-08-22 ENCOUNTER — Other Ambulatory Visit: Payer: Self-pay

## 2017-08-22 ENCOUNTER — Encounter (HOSPITAL_COMMUNITY): Payer: Self-pay | Admitting: Emergency Medicine

## 2017-08-22 DIAGNOSIS — R42 Dizziness and giddiness: Secondary | ICD-10-CM | POA: Diagnosis not present

## 2017-08-22 DIAGNOSIS — J029 Acute pharyngitis, unspecified: Secondary | ICD-10-CM | POA: Diagnosis not present

## 2017-08-22 DIAGNOSIS — J312 Chronic pharyngitis: Secondary | ICD-10-CM

## 2017-08-22 LAB — POCT I-STAT, CHEM 8
BUN: 19 mg/dL (ref 6–20)
CHLORIDE: 101 mmol/L (ref 101–111)
Calcium, Ion: 1.25 mmol/L (ref 1.15–1.40)
Creatinine, Ser: 0.8 mg/dL (ref 0.44–1.00)
Glucose, Bld: 101 mg/dL — ABNORMAL HIGH (ref 65–99)
HEMATOCRIT: 42 % (ref 36.0–46.0)
Hemoglobin: 14.3 g/dL (ref 12.0–15.0)
Potassium: 4.4 mmol/L (ref 3.5–5.1)
SODIUM: 141 mmol/L (ref 135–145)
TCO2: 28 mmol/L (ref 22–32)

## 2017-08-22 NOTE — ED Triage Notes (Signed)
Dizziness started 2 days ago and worsened today.  Sore throat started 2 months ago.  Patient has had multiple provider work up for throat concerns.

## 2017-08-22 NOTE — Discharge Instructions (Signed)
For a sore throat, over the counter products such as [employer] Peroxyl Mouth Sore Rinse or Chloraseptic Sore Throat Spray may provide some temporary relief. You may also try over the counter Claritin (an allergy medicine) for the next 1-2 weeks to see if this helps with your sore throat.

## 2017-08-22 NOTE — ED Provider Notes (Signed)
[place]   [id number] 08/22/17 Arrival Time: 8938  ASSESSMENT & PLAN:  1. Dizziness   2. Sore throat, chronic    Monitor symptoms. Question cause of throat discomfort. Discussed possibility of acid reflux. May try OTC Zantac for a week or two. She plans to schedule f/u with PCP should dizziness return or become more frequent/worse. May f/u here as needed.  Reviewed expectations re: course of current medical issues. Questions answered. Outlined signs and symptoms indicating need for more acute intervention. Patient verbalized understanding. After Visit Summary given.   SUBJECTIVE:  [name] is a 84 y.o. female who presents for evaluation of "dizziness" described as lightheadedness. Symptoms began about 2 days ago and have worsened today. History of similar in the past. Has seen multiple providers for this and recurrent throat discomfort. No current lightheadedness. "Felt a little this morning." No recent illnesses. Afebrile. Symptoms are exacerbated by nothing that is identified. Positions that worsen symptoms: none. Previous workup/treatments: none. Associated ear symptoms: none. Associated CNS symptoms: none. Patient denies otalgia tinnitus hearing loss. Head trauma: denied. No new medications. No CP/SOB. No confusion or mental status changes. Questions if she is drinking enough water daily. Normal vision.  Reports h/o similar dizziness.   ROS: As per HPI. All other systems negative.   OBJECTIVE:  Vitals:   08/22/17 1059  BP: (!) 153/96  Pulse: (!) 49  Resp: 18  Temp: 97.9 F (36.6 C)  TempSrc: Oral  SpO2: 97%    General appearance: alert; no distress Eyes: PERRLA; EOMI; conjunctiva normal HENT: normocephalic; atraumatic; TMs normal; nasal mucosa normal; oral mucosa normal Neck: supple with FROM Lungs: clear to auscultation bilaterally Heart: regular rate and rhythm Abdomen: soft, non-tender; bowel sounds normal Extremities: no cyanosis or  edema; symmetrical with no gross deformities Skin: warm and dry Neurologic: normal gait; normal symmetric reflexes; CN 2-12 grossly intact Psychological: alert and cooperative; normal mood and affect   No Known Allergies  Past Medical History:  Diagnosis Date  . Anxiety   . Atrophic vaginitis   . CAD (coronary artery disease) 2008/2010   Catheterization 2008, 40% LAD after first diagonal  /  nuclear January, 2010 normal  . Carotid artery disease ([place])    Doppler, July, 2011, 0-39% bilateral  . Decreased platelet count ([place]) 04/2011   142,000  . Endometrial polyp   . GERD (gastroesophageal reflux disease)   . Hyperlipidemia   . Hypertension   . Irritable bowel syndrome with constipation   . Memory difficulty 01-20-13   "memory issues"  . Normal nuclear stress test Feb 2013   No ischemia. Normal wall motion. EF greater than 70%  . Osteoarthritis   . Osteopenia   . Osteoporosis   . Palpitations    Monitor, May, 2013, no atrial fib,  one short run of PSVT  . Paroxysmal atrial fibrillation (HCC)    Coumadin therapy  . Polymyalgia rheumatica ([place])   . Spinal stenosis   . Stress   . Vitamin D deficiency    Social History   Socioeconomic History  . Marital status: Married    Spouse name: Jeneen Rinks  . Number of children: 2  . Years of education: Masters  . Highest education level: Not on file  Occupational History  . Occupation: Retired    Fish farm manager: RETIRED  Social Needs  . Financial resource strain: Not on file  . Food insecurity:    Worry: Not on file    Inability: Not on file  . Transportation  needs:    Medical: Not on file    Non-medical: Not on file  Tobacco Use  . Smoking status: Never Smoker  . Smokeless tobacco: Never Used  Substance and Sexual Activity  . Alcohol use: No  . Drug use: No  . Sexual activity: Never    Birth control/protection: Post-menopausal  Lifestyle  . Physical activity:    Days per week: Not on file    Minutes per session: Not on  file  . Stress: Not on file  Relationships  . Social connections:    Talks on phone: Not on file    Gets together: Not on file    Attends religious service: Not on file    Active member of club or organization: Not on file    Attends meetings of clubs or organizations: Not on file    Relationship status: Not on file  . Intimate partner violence:    Fear of current or ex partner: Not on file    Emotionally abused: Not on file    Physically abused: Not on file    Forced sexual activity: Not on file  Other Topics Concern  . Not on file  Social History Narrative   Patient is married Jeneen Rinks) and lives at home with her husband.   Patient has two children.   Retired; Married;   Patient is right-handed.   Patient drinks very little caffeine.   Patient has a Scientist, water quality.   Has had increased stress as her husband had a stroke ~ 3wks ago and she has been taking care of him. (04/07/11)   Family History  Problem Relation Age of Onset  . Transient ischemic attack Mother   . Stroke Mother   . Cancer Father        PROSTATE  . Pancreatitis Father   . Stroke Sister   . Diabetes Sister   . Hypertension Sister   . Heart disease Sister   . Dementia Sister   . Spina bifida Brother   . Hypertension Brother   . Heart disease Brother    Past Surgical History:  Procedure Laterality Date  . BREAST SURGERY     BENIGN BREAST LUMP  . CATARACT SURGERY  2009  . COLONOSCOPY WITH PROPOFOL N/A 02/11/2013   Procedure: COLONOSCOPY WITH PROPOFOL;  Surgeon: [name], MD;  Location: [facility];  Service: Endoscopy;  Laterality: N/A;  . DILATION AND CURETTAGE OF UTERUS    . EXCISION MORTON'S NEUROMA  2000  . HIP SURGERY Right 2005   debridement  . HYSTEROSCOPY  02/07/2005   HYST W D&C, DX: PMB W SMALL ENDO POLYP-SURGEON [name]  . LAMINECTOMY  March 2009   L4 through L5  . Evonnie Dawes, MD 09/17/17 [phone]

## 2017-08-31 DIAGNOSIS — K219 Gastro-esophageal reflux disease without esophagitis: Secondary | ICD-10-CM | POA: Diagnosis not present

## 2017-08-31 DIAGNOSIS — F5101 Primary insomnia: Secondary | ICD-10-CM | POA: Diagnosis not present

## 2017-08-31 DIAGNOSIS — I10 Essential (primary) hypertension: Secondary | ICD-10-CM | POA: Diagnosis not present

## 2017-08-31 DIAGNOSIS — G301 Alzheimer's disease with late onset: Secondary | ICD-10-CM | POA: Diagnosis not present

## 2017-08-31 DIAGNOSIS — I482 Chronic atrial fibrillation: Secondary | ICD-10-CM | POA: Diagnosis not present

## 2017-09-03 ENCOUNTER — Emergency Department (HOSPITAL_COMMUNITY): Payer: Medicare Other

## 2017-09-03 ENCOUNTER — Emergency Department (HOSPITAL_COMMUNITY)
Admission: EM | Admit: 2017-09-03 | Discharge: 2017-09-03 | Disposition: A | Discharge status: Home / Self Care | Payer: Medicare Other | Attending: Emergency Medicine | Admitting: Emergency Medicine

## 2017-09-03 ENCOUNTER — Telehealth (HOSPITAL_COMMUNITY): Payer: Self-pay | Admitting: *Deleted

## 2017-09-03 DIAGNOSIS — I10 Essential (primary) hypertension: Secondary | ICD-10-CM | POA: Insufficient documentation

## 2017-09-03 DIAGNOSIS — I481 Persistent atrial fibrillation: Secondary | ICD-10-CM | POA: Insufficient documentation

## 2017-09-03 DIAGNOSIS — I499 Cardiac arrhythmia, unspecified: Secondary | ICD-10-CM | POA: Diagnosis not present

## 2017-09-03 DIAGNOSIS — R079 Chest pain, unspecified: Secondary | ICD-10-CM | POA: Diagnosis not present

## 2017-09-03 DIAGNOSIS — R05 Cough: Secondary | ICD-10-CM | POA: Diagnosis not present

## 2017-09-03 DIAGNOSIS — J029 Acute pharyngitis, unspecified: Secondary | ICD-10-CM | POA: Diagnosis not present

## 2017-09-03 DIAGNOSIS — F419 Anxiety disorder, unspecified: Secondary | ICD-10-CM | POA: Insufficient documentation

## 2017-09-03 DIAGNOSIS — Z7901 Long term (current) use of anticoagulants: Secondary | ICD-10-CM | POA: Insufficient documentation

## 2017-09-03 DIAGNOSIS — Z79899 Other long term (current) drug therapy: Secondary | ICD-10-CM | POA: Insufficient documentation

## 2017-09-03 DIAGNOSIS — I491 Atrial premature depolarization: Secondary | ICD-10-CM | POA: Diagnosis not present

## 2017-09-03 DIAGNOSIS — I2510 Atherosclerotic heart disease of native coronary artery without angina pectoris: Secondary | ICD-10-CM | POA: Diagnosis not present

## 2017-09-03 DIAGNOSIS — G301 Alzheimer's disease with late onset: Secondary | ICD-10-CM | POA: Insufficient documentation

## 2017-09-03 DIAGNOSIS — R002 Palpitations: Secondary | ICD-10-CM | POA: Diagnosis not present

## 2017-09-03 DIAGNOSIS — I4891 Unspecified atrial fibrillation: Secondary | ICD-10-CM | POA: Diagnosis not present

## 2017-09-03 DIAGNOSIS — I4819 Other persistent atrial fibrillation: Secondary | ICD-10-CM

## 2017-09-03 LAB — I-STAT TROPONIN, ED: TROPONIN I, POC: 0 ng/mL (ref 0.00–0.08)

## 2017-09-03 LAB — CBC
HCT: 44.8 % (ref 36.0–46.0)
HEMOGLOBIN: 14.6 g/dL (ref 12.0–15.0)
MCH: 32 pg (ref 26.0–34.0)
MCHC: 32.6 g/dL (ref 30.0–36.0)
MCV: 98.2 fL (ref 78.0–100.0)
Platelets: 137 10*3/uL — ABNORMAL LOW (ref 150–400)
RBC: 4.56 MIL/uL (ref 3.87–5.11)
RDW: 11.8 % (ref 11.5–15.5)
WBC: 4.6 10*3/uL (ref 4.0–10.5)

## 2017-09-03 LAB — BASIC METABOLIC PANEL
Anion gap: 8 (ref 5–15)
BUN: 18 mg/dL (ref 6–20)
CALCIUM: 9 mg/dL (ref 8.9–10.3)
CO2: 27 mmol/L (ref 22–32)
Chloride: 106 mmol/L (ref 101–111)
Creatinine, Ser: 0.78 mg/dL (ref 0.44–1.00)
GFR calc Af Amer: >60 mL/min (ref >60)
GLUCOSE: 103 mg/dL — AB (ref 65–99)
Potassium: 4.4 mmol/L (ref 3.5–5.1)
SODIUM: 141 mmol/L (ref 135–145)

## 2017-09-03 LAB — PROTIME-INR
INR: 3.03
PROTHROMBIN TIME: 31.1 s — AB (ref 11.4–15.2)

## 2017-09-03 MED ORDER — BENZONATATE 100 MG PO CAPS: 100.0000 mg | ORAL_CAPSULE | Freq: Three times a day (TID) | ORAL | 0 refills | Status: DC

## 2017-09-03 MED ORDER — BENZONATATE 100 MG PO CAPS
100.0000 mg | ORAL_CAPSULE | Freq: Once | ORAL | Status: AC
  Administered 2017-09-03: 100 mg via ORAL
  Filled 2017-09-03: qty 1

## 2017-09-03 NOTE — ED Triage Notes (Signed)
Pt. Arrived by EMS from home with reports of being woken up from her sleep feeling like she was having palpitations. Pt. Reports feeling dizzy. Denies CP. Pt. Has a hx. Of afib. HR will go up to 115 and then brady down into the 40s. Pt. Is on coumadin. A/O X4.

## 2017-09-03 NOTE — ED Notes (Signed)
Pt. Ambulated to bathroom with steady gait.  

## 2017-09-03 NOTE — Telephone Encounter (Signed)
I attempted to contact pt to make an appt for f/u from ED, but recvd pt vcml.  Instruction is to NOT leave any msgs on vcml.  I will try again later

## 2017-09-03 NOTE — ED Provider Notes (Addendum)
[facility] EMERGENCY DEPARTMENT Provider Note  CSN: [id number] Arrival date & time: 09/03/17 8242  Chief Complaint(s) Palpitations  HPI [name] is a 84 y.o. female extensive past medical history listed below including anxiety, late onset Alzheimer, atrial fibrillation (chadsvasc of 5) on Coumadin who presents to the emergency department with recurrent dizziness described as feeling uneasy. Patient reports that she has been feeling her heart fluttering which causes her to feel anxious at times.  During this, she feels the and easy/dizzy sensation.  She denies any focal deficits or difficulty ambulating.  These are similar to prior episodes in the past, which have been evaluated in her PCP and urgent care clinic previously.  Denies any falls or head trauma.  Patient also complaining of several months of persistent sore throat which has been evaluated by primary care provider and another specialist.  She denies any recent fevers or infections.  No shortness of breath, chest pain, nausea, vomiting.  No abdominal pain.  Denies any other physical complaints.    HPI  Past Medical History Past Medical History:  Diagnosis Date  . Anxiety   . Atrophic vaginitis   . CAD (coronary artery disease) 2008/2010   Catheterization 2008, 40% LAD after first diagonal  /  nuclear January, 2010 normal  . Carotid artery disease ([place])    Doppler, July, 2011, 0-39% bilateral  . Decreased platelet count ([place]) 04/2011   142,000  . Endometrial polyp   . GERD (gastroesophageal reflux disease)   . Hyperlipidemia   . Hypertension   . Irritable bowel syndrome with constipation   . Memory difficulty 01-20-13   "memory issues"  . Normal nuclear stress test Feb 2013   No ischemia. Normal wall motion. EF greater than 70%  . Osteoarthritis   . Osteopenia   . Osteoporosis   . Palpitations    Monitor, May, 2013, no atrial fib,  one short run of PSVT  . Paroxysmal atrial fibrillation (HCC)     Coumadin therapy  . Polymyalgia rheumatica ([place])   . Spinal stenosis   . Stress   . Vitamin D deficiency    Patient Active Problem List   Diagnosis Date Noted  . Late onset Alzheimer's disease without behavioral disturbance 09/07/2016  . Episodic lightheadedness 10/12/2015  . Hoarseness 10/12/2015  . Dizziness 06/19/2014  . Memory loss or impairment 07/02/2013  . Encounter for therapeutic drug monitoring 04/25/2013  . Palpitations   . Osteopenia   . Chest pain 04/07/2011  . Spinal stenosis   . Anxiety   . Warfarin anticoagulation   . GERD (gastroesophageal reflux disease)   . Osteoporosis   . Paroxysmal atrial fibrillation (HCC)   . Hypertension   . Hyperlipidemia   . Carotid artery disease ([place])   . CAD (coronary artery disease)   . Ejection fraction    Home Medication(s) Prior to Admission medications   Medication Sig Start Date End Date Taking? Authorizing Provider  benzonatate (TESSALON) 100 MG capsule Take 1 capsule (100 mg total) by mouth every 8 (eight) hours. 09/03/17   Fatima Blank, MD  Cholecalciferol (VITAMIN D3) 1000 units CAPS Take 1,000 Units by mouth daily.    [provider]  lisinopril (PRINIVIL,ZESTRIL) 5 MG tablet Take 5 mg by mouth daily.  09/12/13   [provider]  LUTEIN PO Take by mouth. Take 1 tablet by mouth every Tue, [place], Sat.    [provider]  Melatonin 1 MG TABS Take 1 tablet by mouth at  bedtime as needed.     [provider]  Memantine HCl-Donepezil HCl (NAMZARIC) 28-10 MG CP24 Take 1 capsule by mouth every morning. 07/09/17   Ward Givens, NP  Omega-3 Fatty Acids (FISH OIL PO) Take 1 tablet by mouth. Take 1 tablet by mouth every Mon, Wed, Fri.    [provider]  OVER THE COUNTER MEDICATION Take one teaspoon of [place] power by mouth daily with coffee or food    [provider]  warfarin (COUMADIN) 5 MG tablet TAKE TABLETS BY MOUTH AS DIRECTED BY ANTICOAGULATION CLINIC 04/23/17    Nahser, Wonda Cheng, MD  estradiol (ESTRACE) 1 MG tablet Take 1 mg by mouth as needed.   03/16/11  [provider]                                                                                                                                    Past Surgical History Past Surgical History:  Procedure Laterality Date  . BREAST SURGERY     BENIGN BREAST LUMP  . CATARACT SURGERY  2009  . COLONOSCOPY WITH PROPOFOL N/A 02/11/2013   Procedure: COLONOSCOPY WITH PROPOFOL;  Surgeon: [name], MD;  Location: [facility];  Service: Endoscopy;  Laterality: N/A;  . DILATION AND CURETTAGE OF UTERUS    . EXCISION MORTON'S NEUROMA  2000  . HIP SURGERY Right 2005   debridement  . HYSTEROSCOPY  02/07/2005   HYST W D&C, DX: PMB W SMALL ENDO POLYP-SURGEON [name]  . LAMINECTOMY  March 2009   L4 through L5  . TONSILLECTOMY     Family History Family History  Problem Relation Age of Onset  . Transient ischemic attack Mother   . Stroke Mother   . Cancer Father        PROSTATE  . Pancreatitis Father   . Stroke Sister   . Diabetes Sister   . Hypertension Sister   . Heart disease Sister   . Dementia Sister   . Spina bifida Brother   . Hypertension Brother   . Heart disease Brother     Social History Social History   Tobacco Use  . Smoking status: Never Smoker  . Smokeless tobacco: Never Used  Substance Use Topics  . Alcohol use: No  . Drug use: No   Allergies Patient has no known allergies.  Review of Systems Review of Systems All other systems are reviewed and are negative for acute change except as noted in the HPI  Physical Exam Vital Signs  I have reviewed the triage vital signs BP (!) 187/79   Pulse 86   Temp 97.8 F (36.6 C) (Oral)   Resp (!) 23   Ht 5\' 2"  (1.575 m)   Wt 45.4 kg (100 lb)   SpO2 99%   BMI 18.29 kg/m   Physical Exam  Constitutional: She is oriented to person, place, and time. She appears well-developed and well-nourished. No distress.  HENT:  Head: Normocephalic and atraumatic.  Nose: Mucosal edema present.  Mouth/Throat: Posterior oropharyngeal erythema (mild with PND and cobblestoning) present. No tonsillar exudate.  Eyes: Pupils are equal, round, and reactive to light. Conjunctivae and EOM are normal. Right eye exhibits no discharge. Left eye exhibits no discharge. No scleral icterus.  Neck: Normal range of motion. Neck supple.  Cardiovascular: Normal rate. An irregularly irregular rhythm present. Exam reveals no gallop and no friction rub.  No murmur heard. Pulmonary/Chest: Effort normal and breath sounds normal. No stridor. No respiratory distress. She has no rales.  Abdominal: Soft. She exhibits no distension. There is no tenderness.  Musculoskeletal: She exhibits no edema or tenderness.  Neurological: She is alert and oriented to person, place, and time.  Mental Status:  Alert and oriented to person, place, and time.  Attention and concentration normal.  Speech clear.  Recent memory is intact, but questions answers  Cranial Nerves:  II Visual Fields: Intact to confrontation. Visual fields intact. III, IV, VI: Pupils equal and reactive to light and near. Full eye movement without nystagmus  V Facial Sensation: Normal. No weakness of masticatory muscles  VII: No facial weakness or asymmetry  VIII Auditory Acuity: Grossly normal  IX/X: The uvula is midline; the palate elevates symmetrically  XI: Normal sternocleidomastoid and trapezius strength  XII: The tongue is midline. No atrophy or fasciculations.   Motor System: Muscle Strength: 5/5 and symmetric in the upper and lower extremities. No pronation or drift.  Muscle Tone: Tone and muscle bulk are normal in the upper and lower extremities.   Reflexes: DTRs: 1+ and symmetrical in all four extremities. No Clonus Coordination: Intact finger-to-nose, heel-to-shin. No tremor.  Sensation: Intact to light touch, and pinprick.  Gait: Routine gait normal.   Skin:  Skin is warm and dry. No rash noted. She is not diaphoretic. No erythema.  Psychiatric: She has a normal mood and affect.  Vitals reviewed.   ED Results and Treatments Labs (all labs ordered are listed, but only abnormal results are displayed) Labs Reviewed  BASIC METABOLIC PANEL - Abnormal; Notable for the following components:      Result Value   Glucose, Bld 103 (*)    All other components within normal limits  CBC - Abnormal; Notable for the following components:   Platelets 137 (*)    All other components within normal limits  PROTIME-INR - Abnormal; Notable for the following components:   Prothrombin Time 31.1 (*)    All other components within normal limits  I-STAT TROPONIN, ED                                                                                                                         EKG  EKG Interpretation  Date/Time:  Monday September 03 2017 03:30:27 EDT Ventricular Rate:  82 PR Interval:    QRS Duration: 67 QT Interval:  356 QTC Calculation: 416 R Axis:   69 Text Interpretation:  Atrial fibrillation Anterior infarct,  old Borderline repol abnormality, diffuse leads NO STEMI Confirmed by Addison Lank [phone]) on 09/03/2017 3:43:18 AM      Radiology Dg Chest 2 View  Result Date: 09/03/2017 CLINICAL DATA:  84 year old female with cough and chest pain. EXAM: CHEST - 2 VIEW COMPARISON:  Chest radiograph dated 06/05/2015 FINDINGS: The lungs are clear. There is no pleural effusion or pneumothorax. The cardiac silhouette is within normal limits. Osteopenia. No acute osseous pathology. IMPRESSION: No active cardiopulmonary disease. Electronically Signed   By: Anner Crete M.D.   On: 09/03/2017 03:58   Pertinent labs & imaging results that were available during my care of the patient were reviewed by me and considered in my medical decision making (see chart for details).  Medications Ordered in ED Medications  benzonatate (TESSALON) capsule 100 mg (100 mg Oral  Given 09/03/17 0426)                                                                                                                                    Procedures Procedures  (including critical care time)  Medical Decision Making / ED Course I have reviewed the nursing notes for this encounter and the patient's prior records (if available in EHR or on provided paperwork).    Patient in atrial fibrillation with rate control.  No focal deficits on exam.  Symptoms are intermittent and similar to prior thus doubt CVA.  There appears to be a component of anxiety relating to the symptoms.  This could also be exacerbated by her Alzheimer's memory deficits.   Do not believe that advanced imaging is necessary at this time.  Screening labs grossly reassuring without evidence of significant anemia, electrolyte derangements.  INR therapeutic.  Regarding patient's sore throat it appears that she is got allergic rhinitis with postnasal drip.  Family is tried using over-the-counter allergy medicine and loss suggestions with minimal relief.  Provided with Tessalon Perles resulting in some improvement.  The patient appears reasonably screened and/or stabilized for discharge and I doubt any other medical condition or other [facility] requiring further screening, evaluation, or treatment in the ED at this time prior to discharge.  The patient is safe for discharge with strict return precautions.   Final Clinical Impression(s) / ED Diagnoses Final diagnoses:  Anxiety  Persistent atrial fibrillation ([place])  Sore throat      This chart was dictated using voice recognition software.  Despite best efforts to proofread,  errors can occur which can change the documentation meaning.   Fatima Blank, MD 09/03/17 5374    Fatima Blank, MD 09/07/17 [phone]

## 2017-09-07 DIAGNOSIS — G301 Alzheimer's disease with late onset: Secondary | ICD-10-CM | POA: Diagnosis not present

## 2017-09-07 DIAGNOSIS — F419 Anxiety disorder, unspecified: Secondary | ICD-10-CM | POA: Diagnosis not present

## 2017-09-07 DIAGNOSIS — I10 Essential (primary) hypertension: Secondary | ICD-10-CM | POA: Diagnosis not present

## 2017-09-07 DIAGNOSIS — I482 Chronic atrial fibrillation: Secondary | ICD-10-CM | POA: Diagnosis not present

## 2017-09-12 ENCOUNTER — Ambulatory Visit (INDEPENDENT_AMBULATORY_CARE_PROVIDER_SITE_OTHER): Payer: Medicare Other | Admitting: *Deleted

## 2017-09-12 DIAGNOSIS — Z5181 Encounter for therapeutic drug level monitoring: Secondary | ICD-10-CM | POA: Diagnosis not present

## 2017-09-12 DIAGNOSIS — I480 Paroxysmal atrial fibrillation: Secondary | ICD-10-CM

## 2017-09-12 LAB — POCT INR: INR: 4.8 — AB (ref 2.0–3.0)

## 2017-09-12 NOTE — Patient Instructions (Signed)
Description   Do not take coumadin today July 3rd and no coumadin on July 4th then change dose of coumadin to  1 tablet daily except 1.5 tablets only on Fridays.  Recheck in 2 weeks. Call us with any medication changes or concerns # [phone] [facility]. Monitor for any bleeding and go to ER if seen Do good serving of greens today and tomorrow

## 2017-09-18 DIAGNOSIS — R070 Pain in throat: Secondary | ICD-10-CM | POA: Diagnosis not present

## 2017-09-18 DIAGNOSIS — R490 Dysphonia: Secondary | ICD-10-CM | POA: Diagnosis not present

## 2017-09-25 DIAGNOSIS — H353211 Exudative age-related macular degeneration, right eye, with active choroidal neovascularization: Secondary | ICD-10-CM | POA: Diagnosis not present

## 2017-09-26 ENCOUNTER — Ambulatory Visit (INDEPENDENT_AMBULATORY_CARE_PROVIDER_SITE_OTHER): Payer: Medicare Other | Admitting: *Deleted

## 2017-09-26 DIAGNOSIS — Z5181 Encounter for therapeutic drug level monitoring: Secondary | ICD-10-CM

## 2017-09-26 DIAGNOSIS — I480 Paroxysmal atrial fibrillation: Secondary | ICD-10-CM | POA: Diagnosis not present

## 2017-09-26 LAB — POCT INR: INR: 3.4 — AB (ref 2.0–3.0)

## 2017-09-26 NOTE — Patient Instructions (Signed)
Description   Do not take coumadin today July 17th change dose of coumadin to  1 tablet daily .  Recheck in 2 weeks. Call us with any medication changes or concerns # [phone] Coumadin Clinic.  Do good serving of greens today and keep intake of greens consistent

## 2017-10-10 ENCOUNTER — Ambulatory Visit (INDEPENDENT_AMBULATORY_CARE_PROVIDER_SITE_OTHER): Payer: Medicare Other | Admitting: Pharmacist

## 2017-10-10 DIAGNOSIS — I480 Paroxysmal atrial fibrillation: Secondary | ICD-10-CM

## 2017-10-10 DIAGNOSIS — Z5181 Encounter for therapeutic drug level monitoring: Secondary | ICD-10-CM

## 2017-10-10 LAB — POCT INR: INR: 1.6 — AB (ref 2.0–3.0)

## 2017-10-10 NOTE — Patient Instructions (Signed)
Description   Today take 1.5 tablets then continue 1 tablet daily .  Recheck in 10 days. Call us with any medication changes or concerns # [phone] Coumadin Clinic.

## 2017-10-17 DIAGNOSIS — J029 Acute pharyngitis, unspecified: Secondary | ICD-10-CM | POA: Diagnosis not present

## 2017-10-17 DIAGNOSIS — I10 Essential (primary) hypertension: Secondary | ICD-10-CM | POA: Diagnosis not present

## 2017-10-19 ENCOUNTER — Ambulatory Visit (INDEPENDENT_AMBULATORY_CARE_PROVIDER_SITE_OTHER): Payer: Medicare Other

## 2017-10-19 DIAGNOSIS — I482 Chronic atrial fibrillation: Secondary | ICD-10-CM | POA: Diagnosis not present

## 2017-10-19 DIAGNOSIS — Z5181 Encounter for therapeutic drug level monitoring: Secondary | ICD-10-CM

## 2017-10-19 DIAGNOSIS — G301 Alzheimer's disease with late onset: Secondary | ICD-10-CM | POA: Diagnosis not present

## 2017-10-19 DIAGNOSIS — I480 Paroxysmal atrial fibrillation: Secondary | ICD-10-CM

## 2017-10-19 DIAGNOSIS — I10 Essential (primary) hypertension: Secondary | ICD-10-CM | POA: Diagnosis not present

## 2017-10-19 DIAGNOSIS — R142 Eructation: Secondary | ICD-10-CM | POA: Diagnosis not present

## 2017-10-19 LAB — POCT INR: INR: 1.6 — AB (ref 2.0–3.0)

## 2017-10-19 NOTE — Patient Instructions (Signed)
Description   Start taking 1 tablet daily ecept 1.5 tablets on Mondays and Fridays.  Recheck in 10 days. Call us with any medication changes or concerns # [phone] Coumadin Clinic.

## 2017-10-29 DIAGNOSIS — Z7901 Long term (current) use of anticoagulants: Secondary | ICD-10-CM | POA: Diagnosis not present

## 2017-11-03 ENCOUNTER — Encounter (HOSPITAL_COMMUNITY): Payer: Self-pay | Admitting: Emergency Medicine

## 2017-11-03 ENCOUNTER — Ambulatory Visit (HOSPITAL_COMMUNITY)
Admission: EM | Admit: 2017-11-03 | Discharge: 2017-11-03 | Disposition: A | Discharge status: Home / Self Care | Payer: Medicare Other | Attending: Internal Medicine | Admitting: Internal Medicine

## 2017-11-03 ENCOUNTER — Other Ambulatory Visit: Payer: Self-pay

## 2017-11-03 DIAGNOSIS — J029 Acute pharyngitis, unspecified: Secondary | ICD-10-CM

## 2017-11-03 DIAGNOSIS — R1310 Dysphagia, unspecified: Secondary | ICD-10-CM

## 2017-11-03 MED ORDER — SUCRALFATE 1 GM/10ML PO SUSP: 1.0000 g | Freq: Three times a day (TID) | ORAL | 0 refills | Status: DC

## 2017-11-03 NOTE — ED Provider Notes (Signed)
[place]    CSN: [id number] Arrival date & time: 11/03/17  1011     History   Chief Complaint Chief Complaint  Patient presents with  . Sore Throat    HPI [name] is a 84 y.o. female.   [name] presents with her husband with complaints of ongoing sore throat. This has been ongoing for months now. She has followed with her PCP as well as with ENT for this without any relief. She also experiences frequent hiccups and belches. Occasional epigastirc pain which she feels is related to the hiccups. Occasional dizziness. Today felt like she woke with increased sore throat which brought her in. No fevers or congestion. Has not take any medications for her symptoms. Has been able to eat and drink today, pain with swallowing. No current chest pain or abdominal pain. No current hiccups. No nausea or vomiting. Her husband feels that she did recently have an upper endoscopy with ENT. Per chart review ENT note states "Fiberoptic exam of the pharynx and larynx is again negative".   She had previously been on omeprazole but is not currently taking. Hx of anxity, cad, cad, gerd, hyperlipidemia, htn, alzheimers, afib. She is on warfarin.   ROS per HPI.      Past Medical History:  Diagnosis Date  . Anxiety   . Atrophic vaginitis   . CAD (coronary artery disease) 2008/2010   Catheterization 2008, 40% LAD after first diagonal  /  nuclear January, 2010 normal  . Carotid artery disease ([place])    Doppler, July, 2011, 0-39% bilateral  . Decreased platelet count ([place]) 04/2011   142,000  . Endometrial polyp   . GERD (gastroesophageal reflux disease)   . Hyperlipidemia   . Hypertension   . Irritable bowel syndrome with constipation   . Memory difficulty 01-20-13   "memory issues"  . Normal nuclear stress test Feb 2013   No ischemia. Normal wall motion. EF greater than 70%  . Osteoarthritis   . Osteopenia   . Osteoporosis   . Palpitations    Monitor, May, 2013, no atrial fib,   one short run of PSVT  . Paroxysmal atrial fibrillation ([facility])    Coumadin therapy  . Polymyalgia rheumatica ([place])   . Spinal stenosis   . Stress   . Vitamin D deficiency     Patient Active Problem List   Diagnosis Date Noted  . Late onset Alzheimer's disease without behavioral disturbance 09/07/2016  . Episodic lightheadedness 10/12/2015  . Hoarseness 10/12/2015  . Dizziness 06/19/2014  . Memory loss or impairment 07/02/2013  . Encounter for therapeutic drug monitoring 04/25/2013  . Palpitations   . Osteopenia   . Chest pain 04/07/2011  . Spinal stenosis   . Anxiety   . Warfarin anticoagulation   . GERD (gastroesophageal reflux disease)   . Osteoporosis   . Paroxysmal atrial fibrillation (HCC)   . Hypertension   . Hyperlipidemia   . Carotid artery disease ([place])   . CAD (coronary artery disease)   . Ejection fraction     Past Surgical History:  Procedure Laterality Date  . BREAST SURGERY     BENIGN BREAST LUMP  . CATARACT SURGERY  2009  . COLONOSCOPY WITH PROPOFOL N/A 02/11/2013   Procedure: COLONOSCOPY WITH PROPOFOL;  Surgeon: [name], MD;  Location: [facility];  Service: Endoscopy;  Laterality: N/A;  . DILATION AND CURETTAGE OF UTERUS    . EXCISION MORTON'S NEUROMA  2000  . HIP SURGERY Right 2005  debridement  . HYSTEROSCOPY  02/07/2005   HYST W D&C, DX: PMB W SMALL ENDO POLYP-SURGEON [name]  . LAMINECTOMY  March 2009   L4 through L5  . TONSILLECTOMY      OB History    Gravida  4   Para  2   Term  2   Preterm      AB  2   Living  2     SAB      TAB      Ectopic      Multiple      Live Births               Home Medications    Prior to Admission medications   Medication Sig Start Date End Date Taking? Authorizing Provider  Cholecalciferol (VITAMIN D3) 1000 units CAPS Take 1,000 Units by mouth daily.    [provider]  LUTEIN PO Take by mouth. Take 1 tablet by mouth every Tue, [place], Sat.    [provider]  Melatonin 1 MG TABS Take 1 tablet by mouth at bedtime as needed.     [provider]  Memantine HCl-Donepezil HCl (NAMZARIC) 28-10 MG CP24 Take 1 capsule by mouth every morning. 07/09/17   Ward Givens, NP  Omega-3 Fatty Acids (FISH OIL PO) Take 1 tablet by mouth. Take 1 tablet by mouth every Mon, Wed, Fri.    [provider]  OVER THE COUNTER MEDICATION Take one teaspoon of [place] power by mouth daily with coffee or food    [provider]  sucralfate (CARAFATE) 1 GM/10ML suspension Take 10 mLs (1 g total) by mouth 4 (four) times daily -  with meals and at bedtime. 11/03/17   Zigmund Gottron, NP  warfarin (COUMADIN) 5 MG tablet TAKE TABLETS BY MOUTH AS DIRECTED BY ANTICOAGULATION CLINIC 04/23/17   Nahser, Wonda Cheng, MD  estradiol (ESTRACE) 1 MG tablet Take 1 mg by mouth as needed.   03/16/11  [provider]    Family History Family History  Problem Relation Age of Onset  . Transient ischemic attack Mother   . Stroke Mother   . Cancer Father        PROSTATE  . Pancreatitis Father   . Stroke Sister   . Diabetes Sister   . Hypertension Sister   . Heart disease Sister   . Dementia Sister   . Spina bifida Brother   . Hypertension Brother   . Heart disease Brother     Social History Social History   Tobacco Use  . Smoking status: Never Smoker  . Smokeless tobacco: Never Used  Substance Use Topics  . Alcohol use: No  . Drug use: No     Allergies   Patient has no known allergies.   Review of Systems Review of Systems   Physical Exam Triage Vital Signs ED Triage Vitals  Enc Vitals Group     BP 11/03/17 1100 (!) 137/45     Pulse Rate 11/03/17 1100 67     Resp 11/03/17 1100 16     Temp 11/03/17 1100 (!) 97.4 F (36.3 C)     Temp Source 11/03/17 1100 Oral     SpO2 11/03/17 1100 100 %     Weight --      Height --      Head Circumference --      Peak Flow --      Pain Score 11/03/17 1056 5     Pain Loc --  Pain  Edu? --      Excl. in [place]? --    No data found.  Updated Vital Signs BP (!) 137/45 (BP Location: Left Arm)   Pulse 67   Temp (!) 97.4 F (36.3 C) (Oral)   Resp 16   SpO2 100%    Physical Exam  Constitutional: She is oriented to person, place, and time. She appears well-developed and well-nourished. No distress.  HENT:  Head: Normocephalic and atraumatic.  Right Ear: Tympanic membrane, external ear and ear canal normal.  Left Ear: Tympanic membrane, external ear and ear canal normal.  Nose: Nose normal.  Mouth/Throat: Uvula is midline, oropharynx is clear and moist and mucous membranes are normal. Tonsils are 0 on the right. Tonsils are 0 on the left. No tonsillar exudate.  Eyes: Pupils are equal, round, and reactive to light. Conjunctivae and EOM are normal.  Cardiovascular: Normal rate, regular rhythm and normal heart sounds.  Pulmonary/Chest: Effort normal and breath sounds normal.  Lymphadenopathy:    She has no cervical adenopathy.  Neurological: She is alert and oriented to person, place, and time.  Skin: Skin is warm and dry.     UC Treatments / Results  Labs (all labs ordered are listed, but only abnormal results are displayed) Labs Reviewed - No data to display  EKG None  Radiology No results found.  Procedures Procedures (including critical care time)  Medications Ordered in UC Medications - No data to display  Initial Impression / Assessment and Plan / UC Course  I have reviewed the triage vital signs and the nursing notes.  Pertinent labs & imaging results that were available during my care of the patient were reviewed by me and considered in my medical decision making (see chart for details).     Chronic sore throat. No longer on omeprazole noted. Will try 4 times a day carafate suspension to see if this provides any benefit. Encouraged continue to follow up with primary care provider, may need GI consult if persists or worsens. Return precautions  provided. Patient and husband verbalized understanding and agreeable to plan.  Ambulatory out of clinic without difficulty.    Final Clinical Impressions(s) / UC Diagnoses   Final diagnoses:  Pharyngitis, unspecified etiology  Dysphagia, unspecified type     Discharge Instructions     We will try this medication 4 times a day to see if it provides any relief of symptoms.  Please continue to follow up with your primary care provider as you may need further evaluation and referral for persistent symptoms.  If develop worsening of pain, dizziness, chest or upper abdominal pain, please return or go to the Er.    ED Prescriptions    Medication Sig Dispense Auth. Provider   sucralfate (CARAFATE) 1 GM/10ML suspension Take 10 mLs (1 g total) by mouth 4 (four) times daily -  with meals and at bedtime. 420 mL Augusto Gamble B, NP     Controlled Substance Prescriptions [place] Controlled Substance Registry consulted? Not Applicable   Zigmund Gottron, NP 11/03/17 1152

## 2017-11-03 NOTE — ED Triage Notes (Signed)
Complains of a severe sore throat for a week.  Reports pain is worsening.  Denies a fever.  Patient reports "a little bit" of sinus drainage.  Denies a cough.  Pain with swallowing

## 2017-11-03 NOTE — Discharge Instructions (Signed)
We will try this medication 4 times a day to see if it provides any relief of symptoms.  Please continue to follow up with your primary care provider as you may need further evaluation and referral for persistent symptoms.  If develop worsening of pain, dizziness, chest or upper abdominal pain, please return or go to the Er.

## 2017-11-08 DIAGNOSIS — Z7901 Long term (current) use of anticoagulants: Secondary | ICD-10-CM | POA: Diagnosis not present

## 2017-11-13 DIAGNOSIS — Z7901 Long term (current) use of anticoagulants: Secondary | ICD-10-CM | POA: Diagnosis not present

## 2017-11-21 DIAGNOSIS — Z7901 Long term (current) use of anticoagulants: Secondary | ICD-10-CM | POA: Diagnosis not present

## 2017-11-27 DIAGNOSIS — Z0000 Encounter for general adult medical examination without abnormal findings: Secondary | ICD-10-CM | POA: Diagnosis not present

## 2017-11-27 DIAGNOSIS — Z23 Encounter for immunization: Secondary | ICD-10-CM | POA: Diagnosis not present

## 2017-11-27 DIAGNOSIS — H43811 Vitreous degeneration, right eye: Secondary | ICD-10-CM | POA: Diagnosis not present

## 2017-11-27 DIAGNOSIS — M810 Age-related osteoporosis without current pathological fracture: Secondary | ICD-10-CM | POA: Diagnosis not present

## 2017-11-27 DIAGNOSIS — Z1389 Encounter for screening for other disorder: Secondary | ICD-10-CM | POA: Diagnosis not present

## 2017-11-27 DIAGNOSIS — Z7901 Long term (current) use of anticoagulants: Secondary | ICD-10-CM | POA: Diagnosis not present

## 2017-11-27 DIAGNOSIS — K219 Gastro-esophageal reflux disease without esophagitis: Secondary | ICD-10-CM | POA: Diagnosis not present

## 2017-11-27 DIAGNOSIS — H353211 Exudative age-related macular degeneration, right eye, with active choroidal neovascularization: Secondary | ICD-10-CM | POA: Diagnosis not present

## 2017-11-27 DIAGNOSIS — I482 Chronic atrial fibrillation: Secondary | ICD-10-CM | POA: Diagnosis not present

## 2017-11-27 DIAGNOSIS — G301 Alzheimer's disease with late onset: Secondary | ICD-10-CM | POA: Diagnosis not present

## 2017-11-27 DIAGNOSIS — H353122 Nonexudative age-related macular degeneration, left eye, intermediate dry stage: Secondary | ICD-10-CM | POA: Diagnosis not present

## 2017-11-27 DIAGNOSIS — I10 Essential (primary) hypertension: Secondary | ICD-10-CM | POA: Diagnosis not present

## 2017-12-11 ENCOUNTER — Encounter (HOSPITAL_COMMUNITY): Payer: Self-pay | Admitting: *Deleted

## 2017-12-11 ENCOUNTER — Other Ambulatory Visit: Payer: Self-pay

## 2017-12-11 ENCOUNTER — Ambulatory Visit (HOSPITAL_COMMUNITY)
Admission: EM | Admit: 2017-12-11 | Discharge: 2017-12-11 | Disposition: A | Discharge status: Home / Self Care | Payer: Medicare Other | Attending: Family Medicine | Admitting: Family Medicine

## 2017-12-11 DIAGNOSIS — R42 Dizziness and giddiness: Secondary | ICD-10-CM

## 2017-12-11 DIAGNOSIS — J029 Acute pharyngitis, unspecified: Secondary | ICD-10-CM | POA: Diagnosis not present

## 2017-12-11 MED ORDER — OMEPRAZOLE 20 MG PO CPDR: 20.0000 mg | DELAYED_RELEASE_CAPSULE | Freq: Two times a day (BID) | ORAL | 0 refills | Status: DC

## 2017-12-11 NOTE — ED Provider Notes (Signed)
[place]    CSN: [id number] Arrival date & time: 12/11/17  5277  Chief Complaint  Patient presents with  . Dizziness  . Sore Throat    [name] is 84 y.o. pt here for dizziness.  Duration: 7 days Pass out? No Spinning? No Recent illness/fever? No Headache? No Neurologic signs? No Change in PO intake? No- husband does not think she drinks much fluids  Several week hx of ST. Has seen numerous docs without relief. Assoc s/s's include hiccuping, up abd pain, and belching. Worse at night. Never been tx'd for reflux. Has seen ENT on several occasions.   ROS:  Neuro: As noted in HPI Eyes: No vision changes  Past Medical History:  Diagnosis Date  . Anxiety   . Atrophic vaginitis   . CAD (coronary artery disease) 2008/2010   Catheterization 2008, 40% LAD after first diagonal  /  nuclear January, 2010 normal  . Carotid artery disease ([place])    Doppler, July, 2011, 0-39% bilateral  . Decreased platelet count ([place]) 04/2011   142,000  . Endometrial polyp   . GERD (gastroesophageal reflux disease)   . Hyperlipidemia   . Hypertension   . Irritable bowel syndrome with constipation   . Memory difficulty 01-20-13   "memory issues"  . Normal nuclear stress test Feb 2013   No ischemia. Normal wall motion. EF greater than 70%  . Osteoarthritis   . Osteopenia   . Osteoporosis   . Palpitations    Monitor, May, 2013, no atrial fib,  one short run of PSVT  . Paroxysmal atrial fibrillation ([facility])    Coumadin therapy  . Polymyalgia rheumatica ([place])   . Spinal stenosis   . Stress   . Vitamin D deficiency     Family History  Problem Relation Age of Onset  . Transient ischemic attack Mother   . Stroke Mother   . Cancer Father        PROSTATE  . Pancreatitis Father   . Stroke Sister   . Diabetes Sister   . Hypertension Sister   . Heart disease Sister   . Dementia Sister   . Spina bifida Brother   . Hypertension Brother   . Heart disease Brother        BP (!) 165/87 (BP Location: Left Arm)   Pulse 70   Temp 97.6 F (36.4 C) (Oral)   Resp 16   SpO2 100%  General: Awake, alert, appears stated age Eyes: PERRLA, EOMi Ears: Patent, TM's neg b/l Heart: irreg irreg rhythm, no murmurs, no carotid bruits Lungs: CTAB, no accessory muscle use MSK: 5/5 strength throughout, gait normal Neuro: No cerebellar signs Psych: normal mood and affect   Final Clinical Impressions(s) / UC Diagnoses   Final diagnoses:  Sore throat  Lightheadedness   For ST, will tx for GERD w bid PPI. For her lightheadedness, needs to push more fluids. A fib well controlled from rate standpoint.  See below instructions. Pt and husband voiced understanding and agreement to the plan.    Discharge Instructions     I want you to increase your fluid intake. Water is important, but also something with electrolytes like Gatorade/Powerade/Pedialyte.  Get up slowly and with purpose.  Try the medication to see if it helps your symptoms. Certainly, eating slower could also help.    ED Prescriptions    Medication Sig Dispense Auth. Provider   omeprazole (PRILOSEC) 20 MG capsule Take 1 capsule (20 mg total) by mouth 2 (two)  times daily before a meal. 60 capsule Shelda Pal, DO        Riki Sheer [place], [place] 12/11/17 1147

## 2017-12-11 NOTE — Discharge Instructions (Signed)
I want you to increase your fluid intake. Water is important, but also something with electrolytes like Gatorade/Powerade/Pedialyte.  Get up slowly and with purpose.  Try the medication to see if it helps your symptoms. Certainly, eating slower could also help.

## 2017-12-11 NOTE — ED Triage Notes (Signed)
C/o dizziness on and off for several days, c/o sorethroat with nasal congestion.

## 2017-12-12 DIAGNOSIS — M810 Age-related osteoporosis without current pathological fracture: Secondary | ICD-10-CM | POA: Diagnosis not present

## 2017-12-12 DIAGNOSIS — M8588 Other specified disorders of bone density and structure, other site: Secondary | ICD-10-CM | POA: Diagnosis not present

## 2017-12-18 DIAGNOSIS — Z7901 Long term (current) use of anticoagulants: Secondary | ICD-10-CM | POA: Diagnosis not present

## 2017-12-19 DIAGNOSIS — R070 Pain in throat: Secondary | ICD-10-CM | POA: Diagnosis not present

## 2017-12-20 DIAGNOSIS — Z7901 Long term (current) use of anticoagulants: Secondary | ICD-10-CM | POA: Diagnosis not present

## 2017-12-27 DIAGNOSIS — Z7901 Long term (current) use of anticoagulants: Secondary | ICD-10-CM | POA: Diagnosis not present

## 2018-01-08 ENCOUNTER — Encounter: Payer: Self-pay | Admitting: Adult Health

## 2018-01-08 ENCOUNTER — Ambulatory Visit (INDEPENDENT_AMBULATORY_CARE_PROVIDER_SITE_OTHER): Payer: Medicare Other | Admitting: Adult Health

## 2018-01-08 VITALS — BP 138/71 | HR 80 | Ht 62.0 in | Wt 107.6 lb

## 2018-01-08 DIAGNOSIS — F0280 Dementia in other diseases classified elsewhere without behavioral disturbance: Secondary | ICD-10-CM | POA: Diagnosis not present

## 2018-01-08 DIAGNOSIS — G301 Alzheimer's disease with late onset: Secondary | ICD-10-CM

## 2018-01-08 DIAGNOSIS — I2510 Atherosclerotic heart disease of native coronary artery without angina pectoris: Secondary | ICD-10-CM | POA: Diagnosis not present

## 2018-01-08 NOTE — Progress Notes (Signed)
I have read the note, and I agree with the clinical assessment and plan.  Anaka Beazer K Neilani Duffee   

## 2018-01-08 NOTE — Patient Instructions (Signed)
Your Plan:  Continue Namzaric If your symptoms worsen or you develop new symptoms please let us know.    Thank you for coming to see us at [facility]. I hope we have been able to provide you high quality care today.  You may receive a patient satisfaction survey over the next few weeks. We would appreciate your feedback and comments so that we may continue to improve ourselves and the health of our patients.  

## 2018-01-08 NOTE — Progress Notes (Signed)
PATIENT: [name] DOB: 1933/05/29  REASON FOR VISIT: follow up-memory disturbance HISTORY FROM: patient  HISTORY OF PRESENT ILLNESS: Today 01/08/18:  [name] is an 84 year old female with a history of memory disturbance.  She returns today for follow-up.  She remains on Namzaric.  She continues to live at home with her husband.  Reports that she is able to complete all ADLs independently.  She operates a Teacher, music.  Continues to report that she only drives to familiar places.  She does not drive after dark.  Denies any trouble eating.  Reports good appetite.  She reports that she typically has to take over-the-counter sleeping medicine at least twice a week.[name]  Her husband manages the finances.  She does manage her own medication and appointments with some help from her husbnad.  Denies any change in mood or behavior.  Has reports that she is quicker to get upset or aggravated.  she returns today for evaluation.  HISTORY 07/09/17 [name] is an 84 year old female with a history of memory disturbance.  She returns today for follow-up.  She is currently on Namzaric and continues to tolerate it well.  She does feel that her memory may be slightly worse.  She has noticed that she may wake up in the morning and decide what she wants to do for the day.  She states by midday she has forgotten what she wanted to do.  She lives at home with her husband.  She is able to complete all ADLs independently.  She does operate a motor vehicle but only drives to familiar places and does not drive after dark.  Reports good appetite.  She states that she sleeps fairly well but tends to wake up in the middle the night.  She reports on occasion she will  take over-the-counter sleep medication.  She denies hallucinations.  Reports that she is able to manage her finances.  However her husband does most of the bill paying.  She states that if she goes out to buy groceries she is able to pay and make the  appropriate change.  She continues to be very social she reports.  Reports that she continues to Pele the piano regularly.  She returns today for evaluation.   REVIEW OF SYSTEMS: Out of a complete 14 system review of symptoms, the patient complains only of the following symptoms, and all other reviewed systems are negative.  Dizziness  ALLERGIES: No Known Allergies  HOME MEDICATIONS: Outpatient Medications Prior to Visit  Medication Sig Dispense Refill  . Cholecalciferol (VITAMIN D3) 1000 units CAPS Take 1,000 Units by mouth daily.    [name] ELIQUIS 5 MG TABS tablet Take 5 mg by mouth 2 (two) times daily.    . LUTEIN PO Take by mouth. Take 1 tablet by mouth every Tue, [place], Sat.    . Melatonin 1 MG TABS Take 1 tablet by mouth at bedtime as needed.     . Memantine HCl-Donepezil HCl (NAMZARIC) 28-10 MG CP24 Take 1 capsule by mouth every morning. 90 capsule 3  . Omega-3 Fatty Acids (FISH OIL PO) Take 1 tablet by mouth. Take 1 tablet by mouth every Mon, Wed, Fri.    [name] omeprazole (PRILOSEC) 20 MG capsule Take 1 capsule (20 mg total) by mouth 2 (two) times daily before a meal. (Patient not taking: Reported on 01/08/2018) 60 capsule 0  . OVER THE COUNTER MEDICATION Take one teaspoon of [place]acao power by mouth daily with coffee or food    .  warfarin (COUMADIN) 5 MG tablet TAKE TABLETS BY MOUTH AS DIRECTED BY ANTICOAGULATION CLINIC 135 tablet 1   No facility-administered medications prior to visit.     PAST MEDICAL HISTORY: Past Medical History:  Diagnosis Date  . Anxiety   . Atrophic vaginitis   . CAD (coronary artery disease) 2008/2010   Catheterization 2008, 40% LAD after first diagonal  /  nuclear January, 2010 normal  . Carotid artery disease ([place])    Doppler, July, 2011, 0-39% bilateral  . Decreased platelet count ([place]) 04/2011   142,000  . Endometrial polyp   . GERD (gastroesophageal reflux disease)   . Hyperlipidemia   . Hypertension   . Irritable bowel syndrome with constipation     . Memory difficulty 01-20-13   "memory issues"  . Normal nuclear stress test Feb 2013   No ischemia. Normal wall motion. EF greater than 70%  . Osteoarthritis   . Osteopenia   . Osteoporosis   . Palpitations    Monitor, May, 2013, no atrial fib,  one short run of PSVT  . Paroxysmal atrial fibrillation (HCC)    Coumadin therapy  . Polymyalgia rheumatica ([place])   . Spinal stenosis   . Stress   . Vitamin D deficiency     PAST SURGICAL HISTORY: Past Surgical History:  Procedure Laterality Date  . BREAST SURGERY     BENIGN BREAST LUMP  . CATARACT SURGERY  2009  . COLONOSCOPY WITH PROPOFOL N/A 02/11/2013   Procedure: COLONOSCOPY WITH PROPOFOL;  Surgeon: [name], MD;  Location: [facility];  Service: Endoscopy;  Laterality: N/A;  . DILATION AND CURETTAGE OF UTERUS    . EXCISION MORTON'S NEUROMA  2000  . HIP SURGERY Right 2005   debridement  . HYSTEROSCOPY  02/07/2005   HYST W D&C, DX: PMB W SMALL ENDO POLYP-SURGEON [name]  . LAMINECTOMY  March 2009   L4 through L5  . TONSILLECTOMY      FAMILY HISTORY: Family History  Problem Relation Age of Onset  . Transient ischemic attack Mother   . Stroke Mother   . Cancer Father        PROSTATE  . Pancreatitis Father   . Stroke Sister   . Diabetes Sister   . Hypertension Sister   . Heart disease Sister   . Dementia Sister   . Spina bifida Brother   . Hypertension Brother   . Heart disease Brother     SOCIAL HISTORY: Social History   Socioeconomic History  . Marital status: Married    Spouse name: Jeneen Rinks  . Number of children: 2  . Years of education: Masters  . Highest education level: Not on file  Occupational History  . Occupation: Retired    Fish farm manager: RETIRED  Social Needs  . Financial resource strain: Not on file  . Food insecurity:    Worry: Not on file    Inability: Not on file  . Transportation needs:    Medical: Not on file    Non-medical: Not on file  Tobacco Use  . Smoking status: Never  Smoker  . Smokeless tobacco: Never Used  Substance and Sexual Activity  . Alcohol use: No  . Drug use: No  . Sexual activity: Never    Birth control/protection: Post-menopausal  Lifestyle  . Physical activity:    Days per week: Not on file    Minutes per session: Not on file  . Stress: Not on file  Relationships  . Social connections:    Talks on  phone: Not on file    Gets together: Not on file    Attends religious service: Not on file    Active member of club or organization: Not on file    Attends meetings of clubs or organizations: Not on file    Relationship status: Not on file  . Intimate partner violence:    Fear of current or ex partner: Not on file    Emotionally abused: Not on file    Physically abused: Not on file    Forced sexual activity: Not on file  Other Topics Concern  . Not on file  Social History Narrative   Patient is married Jeneen Rinks) and lives at home with her husband.   Patient has two children.   Retired; Married;   Patient is right-handed.   Patient drinks very little caffeine.   Patient has a Scientist, water quality.   Has had increased stress as her husband had a stroke ~ 3wks ago and she has been taking care of him. (04/07/11)      PHYSICAL EXAM  Vitals:   01/08/18 0908  Weight: 107 lb 9.6 oz (48.8 kg)  Height: 5\' 2"  (1.575 m)   Body mass index is 19.68 kg/m.  MMSE - Mini Mental State Exam 01/08/2018 07/09/2017 01/08/2017  Not completed: (No Data) - -  Orientation to time 2 3 3   Orientation to Place 3 3 3   Registration 3 3 3   Attention/ Calculation 1 1 3   Recall 0 0 0  Language- name 2 objects 2 2 2   Language- repeat 1 1 1   Language- follow 3 step command 3 2 3   Language- follow 3 step command-comments - took w/L hand, switched to R hand -  Language- read & follow direction 1 1 1   Write a sentence 1 1 1   Copy design 1 0 1  Total score 18 17 21      Generalized: Well developed, in no acute distress   Neurological examination  Mentation:  Alert. Follows all commands speech and language fluent Cranial nerve II-XII: Pupils were equal round reactive to light. Extraocular movements were full, visual field were full on confrontational test. Facial sensation and strength were normal. Uvula tongue midline. Head turning and shoulder shrug  were normal and symmetric. Motor: The motor testing reveals 5 over 5 strength of all 4 extremities. Good symmetric motor tone is noted throughout.  Sensory: Sensory testing is intact to soft touch on all 4 extremities. No evidence of extinction is noted.  Coordination: Cerebellar testing reveals good finger-nose-finger and heel-to-shin bilaterally.  Gait and station: Gait is normal.   Reflexes: Deep tendon reflexes are symmetric and normal bilaterally.   DIAGNOSTIC DATA (LABS, IMAGING, TESTING) - I reviewed patient records, labs, notes, testing and imaging myself where available.  Lab Results  Component Value Date   WBC 4.6 09/03/2017   HGB 14.6 09/03/2017   HCT 44.8 09/03/2017   MCV 98.2 09/03/2017   PLT 137 (L) 09/03/2017      Component Value Date/Time   NA 141 09/03/2017 0336   NA 140 04/17/2017 1035   K 4.4 09/03/2017 0336   CL 106 09/03/2017 0336   CO2 27 09/03/2017 0336   GLUCOSE 103 (H) 09/03/2017 0336   BUN 18 09/03/2017 0336   BUN 25 04/17/2017 1035   CREATININE 0.78 09/03/2017 0336   CALCIUM 9.0 09/03/2017 0336   PROT 5.8 (L) 06/27/2016 0909   ALBUMIN 4.0 06/27/2016 0909   AST 31 06/27/2016 0909   ALT 35 (  H) 06/27/2016 0909   ALKPHOS 55 06/27/2016 0909   BILITOT 0.5 06/27/2016 0909   GFRNONAA >60 09/03/2017 0336   GFRAA >60 09/03/2017 0336   Lab Results  Component Value Date   CHOL 187 06/27/2016   HDL 75 06/27/2016   LDLCALC 94 06/27/2016   LDLDIRECT 119.7 08/26/2009   TRIG 91 06/27/2016   CHOLHDL 2.5 06/27/2016    Lab Results  Component Value Date   TSH 1.740 04/17/2017      ASSESSMENT AND PLAN 84 y.o. year old female  has a past medical history of  Anxiety, Atrophic vaginitis, CAD (coronary artery disease) (2008/2010), Carotid artery disease ([place]), Decreased platelet count ([place]) (04/2011), Endometrial polyp, GERD (gastroesophageal reflux disease), Hyperlipidemia, Hypertension, Irritable bowel syndrome with constipation, Memory difficulty (01-20-13), Normal nuclear stress test (Feb 2013), Osteoarthritis, Osteopenia, Osteoporosis, Palpitations, Paroxysmal atrial fibrillation ([place]), Polymyalgia rheumatica ([place]), Spinal stenosis, Stress, and Vitamin D deficiency. here with :  1.  Memory disturbance  The patient's memory score has remained relatively stable.  She will continue on Namzaric.  Advised that if her symptoms worsen or she develops new symptoms she should let us know.  She will follow-up in 6 months or sooner if needed.   I spent 15 minutes with the patient. 50% of this time was spent reviewing her memory score   Ward Givens, MSN, NP-C 01/08/2018, 9:12 AM [facility] [address] [place], [place] [address] [phone]

## 2018-01-10 DIAGNOSIS — Z79899 Other long term (current) drug therapy: Secondary | ICD-10-CM | POA: Diagnosis not present

## 2018-01-10 DIAGNOSIS — K219 Gastro-esophageal reflux disease without esophagitis: Secondary | ICD-10-CM | POA: Diagnosis not present

## 2018-01-10 DIAGNOSIS — I4821 Permanent atrial fibrillation: Secondary | ICD-10-CM | POA: Diagnosis not present

## 2018-01-10 DIAGNOSIS — M810 Age-related osteoporosis without current pathological fracture: Secondary | ICD-10-CM | POA: Diagnosis not present

## 2018-01-10 DIAGNOSIS — E7800 Pure hypercholesterolemia, unspecified: Secondary | ICD-10-CM | POA: Diagnosis not present

## 2018-01-10 DIAGNOSIS — I10 Essential (primary) hypertension: Secondary | ICD-10-CM | POA: Diagnosis not present

## 2018-01-28 DIAGNOSIS — R070 Pain in throat: Secondary | ICD-10-CM | POA: Diagnosis not present

## 2018-02-05 DIAGNOSIS — H353211 Exudative age-related macular degeneration, right eye, with active choroidal neovascularization: Secondary | ICD-10-CM | POA: Diagnosis not present

## 2018-02-13 DIAGNOSIS — I482 Chronic atrial fibrillation, unspecified: Secondary | ICD-10-CM | POA: Diagnosis not present

## 2018-02-13 DIAGNOSIS — M545 Low back pain: Secondary | ICD-10-CM | POA: Diagnosis not present

## 2018-02-13 DIAGNOSIS — G301 Alzheimer's disease with late onset: Secondary | ICD-10-CM | POA: Diagnosis not present

## 2018-02-13 DIAGNOSIS — I10 Essential (primary) hypertension: Secondary | ICD-10-CM | POA: Diagnosis not present

## 2018-02-19 DIAGNOSIS — R070 Pain in throat: Secondary | ICD-10-CM | POA: Diagnosis not present

## 2018-02-22 DIAGNOSIS — H353131 Nonexudative age-related macular degeneration, bilateral, early dry stage: Secondary | ICD-10-CM | POA: Diagnosis not present

## 2018-03-14 DIAGNOSIS — R591 Generalized enlarged lymph nodes: Secondary | ICD-10-CM | POA: Diagnosis not present

## 2018-03-14 DIAGNOSIS — K1379 Other lesions of oral mucosa: Secondary | ICD-10-CM | POA: Diagnosis not present

## 2018-03-25 ENCOUNTER — Other Ambulatory Visit: Payer: Self-pay | Admitting: Nurse Practitioner

## 2018-03-25 DIAGNOSIS — R591 Generalized enlarged lymph nodes: Secondary | ICD-10-CM

## 2018-03-27 DIAGNOSIS — G301 Alzheimer's disease with late onset: Secondary | ICD-10-CM | POA: Diagnosis not present

## 2018-03-27 DIAGNOSIS — I10 Essential (primary) hypertension: Secondary | ICD-10-CM | POA: Diagnosis not present

## 2018-03-27 DIAGNOSIS — J029 Acute pharyngitis, unspecified: Secondary | ICD-10-CM | POA: Diagnosis not present

## 2018-03-27 DIAGNOSIS — I482 Chronic atrial fibrillation, unspecified: Secondary | ICD-10-CM | POA: Diagnosis not present

## 2018-03-28 ENCOUNTER — Ambulatory Visit
Admission: RE | Admit: 2018-03-28 | Discharge: 2018-03-28 | Disposition: A | Discharge status: Home / Self Care | Payer: Medicare Other | Source: Ambulatory Visit | Attending: Nurse Practitioner | Admitting: Nurse Practitioner

## 2018-03-28 DIAGNOSIS — J029 Acute pharyngitis, unspecified: Secondary | ICD-10-CM | POA: Diagnosis not present

## 2018-03-28 DIAGNOSIS — R591 Generalized enlarged lymph nodes: Secondary | ICD-10-CM

## 2018-04-08 DIAGNOSIS — R0982 Postnasal drip: Secondary | ICD-10-CM | POA: Diagnosis not present

## 2018-04-08 DIAGNOSIS — J029 Acute pharyngitis, unspecified: Secondary | ICD-10-CM | POA: Diagnosis not present

## 2018-04-19 DIAGNOSIS — R490 Dysphonia: Secondary | ICD-10-CM | POA: Diagnosis not present

## 2018-04-19 DIAGNOSIS — R070 Pain in throat: Secondary | ICD-10-CM | POA: Diagnosis not present

## 2018-04-19 DIAGNOSIS — M810 Age-related osteoporosis without current pathological fracture: Secondary | ICD-10-CM | POA: Diagnosis not present

## 2018-04-19 DIAGNOSIS — I480 Paroxysmal atrial fibrillation: Secondary | ICD-10-CM | POA: Diagnosis not present

## 2018-04-19 DIAGNOSIS — G301 Alzheimer's disease with late onset: Secondary | ICD-10-CM | POA: Diagnosis not present

## 2018-04-19 DIAGNOSIS — M8580 Other specified disorders of bone density and structure, unspecified site: Secondary | ICD-10-CM | POA: Diagnosis not present

## 2018-04-19 DIAGNOSIS — F0280 Dementia in other diseases classified elsewhere without behavioral disturbance: Secondary | ICD-10-CM | POA: Diagnosis not present

## 2018-05-06 ENCOUNTER — Ambulatory Visit: Payer: Medicare Other | Admitting: Neurology

## 2018-05-06 NOTE — Progress Notes (Deleted)
NEUROLOGY FOLLOW UP OFFICE NOTE  [name] [id number]  HISTORY OF PRESENT ILLNESS: [name] is an 85 year old right-handed woman with paroxysmal atrial fibrillation, hypertension, CAD, polymyalgia rheumatica, depression, IBS and arthritis who follows up for Alzheimer's disease.  She is accompanied by her husband who supplements history.  UPDATE: She is currently taking Namzaric.  ***  HISTORY: She is a retired Education officer, museum. She began noticing gradual memory deficits over the past 4 years. At first, she would forget appointments. She would need to use a calendar to remember to keep phone appointments with friends that she had for 6-8 years. She tended to look at the calendar 3 or 4 times a day to make sure she doesn't miss an appointment. She began misplacing objects around the house. She began forgetting recipes that she used for many years. She has lived in [place] for over 37 years. Sometimes when somebody talks to her about a specific location, she has to stop and try to remember where it is. On rare occasions, she has gotten disoriented while driving familiar routes. She cannot remember names of certain streets. She forgets names of acquaintances and it would take a while before she remembers. She enjoys reading, but often she will have to re-read chapters because she forgot the content. She belongs to a book club and has become less vocal because she cannot remember the story as much anymore. When she is in the kitchen, she sometimes forgets where she keeps the pots or pans. She has also begun forgetting to take her medication, so her husband has to remind her. Her husband manages the finances, but she does write checks and pay some bills and has not had any difficulty with that. When she wakes up in the morning, she is not sure of the day or what her plans are for that day. She was given a diagnosis of early Alzheimer's recently, and has been anxious and scared  since that time. She easily cries but she says it is due to this news and not because of depression. She denies hallucinations or delusions. She usually sleeps well. Both her mother and sister had dementia.   She also notes shakiness in her hands. When she writes or holds a utensil, her hand shakes.   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 TSH was 2.850 and methylmalonic acid level was 145.  She has cerebrovascular disease with risk factors such as atrial fibrillation, hypertension, and hyperlipidemia.  PAST MEDICAL HISTORY: Past Medical History:  Diagnosis Date  . Anxiety   . Atrophic vaginitis   . CAD (coronary artery disease) 2008/2010   Catheterization 2008, 40% LAD after first diagonal  /  nuclear January, 2010 normal  . Carotid artery disease ([place])    Doppler, July, 2011, 0-39% bilateral  . Decreased platelet count ([place]) 04/2011   142,000  . Endometrial polyp   . GERD (gastroesophageal reflux disease)   . Hyperlipidemia   . Hypertension   . Irritable bowel syndrome with constipation   . Memory difficulty 01-20-13   "memory issues"  . Normal nuclear stress test Feb 2013   No ischemia. Normal wall motion. EF greater than 70%  . Osteoarthritis   . Osteopenia   . Osteoporosis   . Palpitations    Monitor, May, 2013, no atrial fib,  one short run of PSVT  .  Paroxysmal atrial fibrillation (HCC)    Coumadin therapy  . Polymyalgia rheumatica ([place])   . Spinal stenosis   . Stress   . Vitamin D deficiency     MEDICATIONS: Current Outpatient Medications on File Prior to Visit  Medication Sig Dispense Refill  . Cholecalciferol (VITAMIN D3) 1000 units CAPS Take 1,000 Units by mouth daily.    [name] ELIQUIS 5 MG TABS tablet Take 5 mg by mouth 2 (two) times daily.    . LUTEIN PO Take by mouth. Take 1  tablet by mouth every Tue, [place], Sat.    . Melatonin 1 MG TABS Take 1 tablet by mouth at bedtime as needed.     . Memantine HCl-Donepezil HCl (NAMZARIC) 28-10 MG CP24 Take 1 capsule by mouth every morning. 90 capsule 3  . Omega-3 Fatty Acids (FISH OIL PO) Take 1 tablet by mouth. Take 1 tablet by mouth every Mon, Wed, Fri.    [name] omeprazole (PRILOSEC) 20 MG capsule Take 1 capsule (20 mg total) by mouth 2 (two) times daily before a meal. (Patient not taking: Reported on 01/08/2018) 60 capsule 0  . OVER THE COUNTER MEDICATION Take one teaspoon of [place] power by mouth daily with coffee or food    . [DISCONTINUED] estradiol (ESTRACE) 1 MG tablet Take 1 mg by mouth as needed.      No current facility-administered medications on file prior to visit.     ALLERGIES: No Known Allergies  FAMILY HISTORY: Family History  Problem Relation Age of Onset  . Transient ischemic attack Mother   . Stroke Mother   . Cancer Father        PROSTATE  . Pancreatitis Father   . Stroke Sister   . Diabetes Sister   . Hypertension Sister   . Heart disease Sister   . Dementia Sister   . Spina bifida Brother   . Hypertension Brother   . Heart disease Brother    ***.  SOCIAL HISTORY: Social History   Socioeconomic History  . Marital status: Married    Spouse name: Jeneen Rinks  . Number of children: 2  . Years of education: Masters  . Highest education level: Not on file  Occupational History  . Occupation: Retired    Fish farm manager: RETIRED  Social Needs  . Financial resource strain: Not on file  . Food insecurity:    Worry: Not on file    Inability: Not on file  . Transportation needs:    Medical: Not on file    Non-medical: Not on file  Tobacco Use  . Smoking status: Never Smoker  . Smokeless tobacco: Never Used  Substance and Sexual Activity  . Alcohol use: No  . Drug use: No  . Sexual activity: Never    Birth control/protection: Post-menopausal  Lifestyle  . Physical activity:    Days per week:  Not on file    Minutes per session: Not on file  . Stress: Not on file  Relationships  . Social connections:    Talks on phone: Not on file    Gets together: Not on file    Attends religious service: Not on file    Active member of club or organization: Not on file    Attends meetings of clubs or organizations: Not on file    Relationship status: Not on file  . Intimate partner violence:    Fear of current or ex partner: Not on file    Emotionally abused: Not on file    Physically  abused: Not on file    Forced sexual activity: Not on file  Other Topics Concern  . Not on file  Social History Narrative   Patient is married Jeneen Rinks) and lives at home with her husband.   Patient has two children.   Retired; Married;   Patient is right-handed.   Patient drinks very little caffeine.   Patient has a Scientist, water quality.   Has had increased stress as her husband had a stroke ~ 3wks ago and she has been taking care of him. (04/07/11)    REVIEW OF SYSTEMS: Constitutional: No fevers, chills, or sweats, no generalized fatigue, change in appetite Eyes: No visual changes, double vision, eye pain Ear, nose and throat: No hearing loss, ear pain, nasal congestion, sore throat Cardiovascular: No chest pain, palpitations Respiratory:  No shortness of breath at rest or with exertion, wheezes GastrointestinaI: No nausea, vomiting, diarrhea, abdominal pain, fecal incontinence Genitourinary:  No dysuria, urinary retention or frequency Musculoskeletal:  No neck pain, back pain Integumentary: No rash, pruritus, skin lesions Neurological: as above Psychiatric: No depression, insomnia, anxiety Endocrine: No palpitations, fatigue, diaphoresis, mood swings, change in appetite, change in weight, increased thirst Hematologic/Lymphatic:  No purpura, petechiae. Allergic/Immunologic: no itchy/runny eyes, nasal congestion, recent allergic reactions, rashes  PHYSICAL EXAM: *** General: No acute distress.  Patient  appears ***-groomed.  *** body habitus. Head:  Normocephalic/atraumatic Eyes:  Fundi examined but not visualized Neck: supple, no paraspinal tenderness, full range of motion Heart:  Regular rate and rhythm Lungs:  Clear to auscultation bilaterally Back: No paraspinal tenderness Neurological Exam: alert and oriented to person, place, and time. Attention span and concentration intact, recent and remote memory intact, fund of knowledge intact.  Speech fluent and not dysarthric, language intact.  CN II-XII intact. Bulk and tone normal, muscle strength 5/5 throughout.  Sensation to light touch, temperature and vibration intact.  Deep tendon reflexes 2+ throughout, toes downgoing.  Finger to nose and heel to shin testing intact.  Gait normal, Romberg negative.  IMPRESSION: ***  PLAN: ***  Metta Clines, DO  CC: ***

## 2018-05-16 NOTE — Progress Notes (Signed)
NEUROLOGY FOLLOW UP OFFICE NOTE  [name] [id number]  HISTORY OF PRESENT ILLNESS: [name] is an 85 year old right-handed woman with paroxysmal atrial fibrillation, hypertension, CAD, polymyalgia rheumatica, depression, IBS and arthritis who follows up for Alzheimer's disease.  She is accompanied by her husband who supplements history.  UPDATE: She is currently taking Namzaric.  Since last visit, she moved to [facility] [place] but left after 2 years because she would be separated from her husband during the day.  She then moved to [facility].  It has not been a good experience.  They often are woken up by loud heating system or a train that comes by the facility.  The food is not good.  Also other residents are non-ambulatory.  They have reserved a place at [facility] which will open at the end of the month.  They have better medical facilities.  She has given up driving.    Her disease has progressed.  Memory is worse.  She is repeating questions frequently.  Remote memory is better. She does participate in activities.  She socializes at these activities.  She enjoys church.  She still plays the piano, which helps her mood.  She seldom sits and reads or call other friends to make appointments.   Her husband usually needs to remind her to take medications.  She often feels dizzy (lightheaded) if she stands up or moves too fast.  She doesn't keep hydrated.  She eats 3 meals a day.  Sleep is overall good but sometimes wakes up in the middle of the night.  She takes melatonin.  She wakes up early due to the train.  Sometimes she may get her grandchildren mixed up.    HISTORY: She is a retired Education officer, museum. She began noticing gradual memory deficits over the past 4 years. At first, she would forget appointments. She would need to use a calendar to remember to keep phone appointments with friends that she had for 6-8 years. She tended to look at the calendar 3 or 4 times a day to make  sure she doesn't miss an appointment. She began misplacing objects around the house. She began forgetting recipes that she used for many years. She has lived in [place] for over 21 years. Sometimes when somebody talks to her about a specific location, she has to stop and try to remember where it is. On rare occasions, she has gotten disoriented while driving familiar routes. She cannot remember names of certain streets. She forgets names of acquaintances and it would take a while before she remembers. She enjoys reading, but often she will have to re-read chapters because she forgot the content. She belongs to a book club and has become less vocal because she cannot remember the story as much anymore. When she is in the kitchen, she sometimes forgets where she keeps the pots or pans. She has also begun forgetting to take her medication, so her husband has to remind her. Her husband manages the finances, but she does write checks and pay some bills and has not had any difficulty with that. When she wakes up in the morning, she is not sure of the day or what her plans are for that day. She was given a diagnosis of early Alzheimer's recently, and has been anxious and scared since that time. She easily cries but she says it is due to this news and not because of depression. She denies hallucinations or delusions. She usually sleeps well.  Both her mother and sister had dementia.   She also notes shakiness in her hands. When she writes or holds a utensil, her hand shakes.   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 TSH was 2.850 and methylmalonic acid level was 145.  She has cerebrovascular disease with risk factors such as atrial fibrillation, hypertension, and hyperlipidemia.  PAST MEDICAL HISTORY: Past Medical  History:  Diagnosis Date  . Anxiety   . Atrophic vaginitis   . CAD (coronary artery disease) 2008/2010   Catheterization 2008, 40% LAD after first diagonal  /  nuclear January, 2010 normal  . Carotid artery disease ([place])    Doppler, July, 2011, 0-39% bilateral  . Decreased platelet count ([place]) 04/2011   142,000  . Endometrial polyp   . GERD (gastroesophageal reflux disease)   . Hyperlipidemia   . Hypertension   . Irritable bowel syndrome with constipation   . Memory difficulty 01-20-13   "memory issues"  . Normal nuclear stress test Feb 2013   No ischemia. Normal wall motion. EF greater than 70%  . Osteoarthritis   . Osteopenia   . Osteoporosis   . Palpitations    Monitor, May, 2013, no atrial fib,  one short run of PSVT  . Paroxysmal atrial fibrillation (HCC)    Coumadin therapy  . Polymyalgia rheumatica ([place])   . Spinal stenosis   . Stress   . Vitamin D deficiency     MEDICATIONS: Current Outpatient Medications on File Prior to Visit  Medication Sig Dispense Refill  . Cholecalciferol (VITAMIN D3) 1000 units CAPS Take 1,000 Units by mouth daily.    [name] ELIQUIS 5 MG TABS tablet Take 5 mg by mouth 2 (two) times daily.    . LUTEIN PO Take by mouth. Take 1 tablet by mouth every Tue, [place], Sat.    . Melatonin 1 MG TABS Take 1 tablet by mouth at bedtime as needed.     . Memantine HCl-Donepezil HCl (NAMZARIC) 28-10 MG CP24 Take 1 capsule by mouth every morning. 90 capsule 3  . Omega-3 Fatty Acids (FISH OIL PO) Take 1 tablet by mouth. Take 1 tablet by mouth every Mon, Wed, Fri.    [name] omeprazole (PRILOSEC) 20 MG capsule Take 1 capsule (20 mg total) by mouth 2 (two) times daily before a meal. (Patient not taking: Reported on 01/08/2018) 60 capsule 0  . OVER THE COUNTER MEDICATION Take one teaspoon of [place] power by mouth daily with coffee or food    . [DISCONTINUED] estradiol (ESTRACE) 1 MG tablet Take 1 mg by mouth as needed.      No current facility-administered medications on file  prior to visit.     ALLERGIES: No Known Allergies  FAMILY HISTORY: Family History  Problem Relation Age of Onset  . Transient ischemic attack Mother   . Stroke Mother   . Cancer Father        PROSTATE  . Pancreatitis Father   . Stroke Sister   . Diabetes Sister   . Hypertension Sister   . Heart disease Sister   . Dementia Sister   . Spina bifida Brother   . Hypertension Brother   . Heart disease Brother    SOCIAL HISTORY: Social History   Socioeconomic History  . Marital status: Married    Spouse name: Jeneen Rinks  . Number of children: 2  . Years of  education: Masters  . Highest education level: Not on file  Occupational History  . Occupation: Retired    Fish farm manager: RETIRED  Social Needs  . Financial resource strain: Not on file  . Food insecurity:    Worry: Not on file    Inability: Not on file  . Transportation needs:    Medical: Not on file    Non-medical: Not on file  Tobacco Use  . Smoking status: Never Smoker  . Smokeless tobacco: Never Used  Substance and Sexual Activity  . Alcohol use: No  . Drug use: No  . Sexual activity: Never    Birth control/protection: Post-menopausal  Lifestyle  . Physical activity:    Days per week: Not on file    Minutes per session: Not on file  . Stress: Not on file  Relationships  . Social connections:    Talks on phone: Not on file    Gets together: Not on file    Attends religious service: Not on file    Active member of club or organization: Not on file    Attends meetings of clubs or organizations: Not on file    Relationship status: Not on file  . Intimate partner violence:    Fear of current or ex partner: Not on file    Emotionally abused: Not on file    Physically abused: Not on file    Forced sexual activity: Not on file  Other Topics Concern  . Not on file  Social History Narrative   Patient is married Jeneen Rinks) and lives at home with her husband.   Patient has two children.   Retired; Married;   Patient  is right-handed.   Patient drinks very little caffeine.   Patient has a Scientist, water quality.   Has had increased stress as her husband had a stroke ~ 3wks ago and she has been taking care of him. (04/07/11)    REVIEW OF SYSTEMS: Constitutional: No fevers, chills, or sweats, no generalized fatigue, change in appetite Eyes: No visual changes, double vision, eye pain Ear, nose and throat: No hearing loss, ear pain, nasal congestion, sore throat Cardiovascular: No chest pain, palpitations Respiratory:  No shortness of breath at rest or with exertion, wheezes GastrointestinaI: No nausea, vomiting, diarrhea, abdominal pain, fecal incontinence Genitourinary:  No dysuria, urinary retention or frequency Musculoskeletal:  No neck pain, back pain Integumentary: No rash, pruritus, skin lesions Neurological: as above Psychiatric: No depression, insomnia, anxiety Endocrine: No palpitations, fatigue, diaphoresis, mood swings, change in appetite, change in weight, increased thirst Hematologic/Lymphatic:  No purpura, petechiae. Allergic/Immunologic: no itchy/runny eyes, nasal congestion, recent allergic reactions, rashes  PHYSICAL EXAM: Blood pressure 122/68, pulse 92, height 5\' 2"  (1.575 m), weight 107 lb (48.5 kg), SpO2 98 %. General: No acute distress.  Patient appears well-groomed.  Head:  Normocephalic/atraumatic Eyes:  Fundi examined but not visualized Neck: supple, no paraspinal tenderness, full range of motion Heart:  Regular rate and rhythm Lungs:  Clear to auscultation bilaterally Back: No paraspinal tenderness Neurological Exam: Alert.  Language intact.  Attention and concentration impaired.  MMSE - Mini Mental State Exam 01/08/2018 07/09/2017 01/08/2017 09/07/2016 04/25/2016 10/12/2015 07/12/2015  Not completed: (No Data) - - - - - -  Orientation to time 2 3 3 3 2 5 4   Orientation to Place 3 3 3 3 2 4 4   Registration 3 3 3 3 3 3 3   Attention/ Calculation 1 1 3 3 4 5 3   Recall 0 0 0 0 0 1  0    Language- name 2 objects 2 2 2 2 2 2 2   Language- repeat 1 1 1 1 1  0 1  Language- follow 3 step command 3 2 3 3 3 3 3   Language- follow 3 step command-comments - took w/L hand, switched to R hand - - - - -  Language- read & follow direction 1 1 1 1 1 1 1   Write a sentence 1 1 1 1 1 1 1   Copy design 1 0 1 1 1  0 1  Total score 18 17 21 21 20 25 23    CN II-XII intact. Bulk and tone normal, muscle strength 5/5 throughout.  Sensation to light touch, temperature and vibration intact.  Deep tendon reflexes 2+ throughout, toes downgoing.  Finger to nose and heel to shin testing intact.  Gait normal, Romberg negative.  IMPRESSION: Alzheimer's disease  PLAN: 1.  Continue Namzaric 2.  Continue activities and socialization 3.  Follow up in 6 months.  25 minutes spent face to face with patient, over 50% spent discussing management.  Metta Clines, DO  CC: Lajean Manes, MD

## 2018-05-17 ENCOUNTER — Encounter: Payer: Self-pay | Admitting: Neurology

## 2018-05-17 ENCOUNTER — Ambulatory Visit (INDEPENDENT_AMBULATORY_CARE_PROVIDER_SITE_OTHER): Payer: Medicare Other | Admitting: Neurology

## 2018-05-17 ENCOUNTER — Other Ambulatory Visit: Payer: Self-pay

## 2018-05-17 VITALS — BP 122/68 | HR 92 | Ht 62.0 in | Wt 107.0 lb

## 2018-05-17 DIAGNOSIS — F0280 Dementia in other diseases classified elsewhere without behavioral disturbance: Secondary | ICD-10-CM

## 2018-05-17 DIAGNOSIS — G301 Alzheimer's disease with late onset: Secondary | ICD-10-CM

## 2018-05-17 NOTE — Patient Instructions (Signed)
Continue Namzaric Follow up in 6 months. 

## 2018-05-23 DIAGNOSIS — R070 Pain in throat: Secondary | ICD-10-CM | POA: Diagnosis not present

## 2018-05-23 DIAGNOSIS — L439 Lichen planus, unspecified: Secondary | ICD-10-CM | POA: Diagnosis not present

## 2018-06-03 ENCOUNTER — Other Ambulatory Visit: Payer: Self-pay

## 2018-06-03 ENCOUNTER — Emergency Department (HOSPITAL_COMMUNITY): Payer: Medicare Other

## 2018-06-03 ENCOUNTER — Encounter (HOSPITAL_COMMUNITY): Payer: Self-pay

## 2018-06-03 ENCOUNTER — Emergency Department (HOSPITAL_COMMUNITY)
Admission: EM | Admit: 2018-06-03 | Discharge: 2018-06-03 | Disposition: A | Discharge status: Home / Self Care | Payer: Medicare Other | Attending: Emergency Medicine | Admitting: Emergency Medicine

## 2018-06-03 DIAGNOSIS — I480 Paroxysmal atrial fibrillation: Secondary | ICD-10-CM | POA: Insufficient documentation

## 2018-06-03 DIAGNOSIS — Z79899 Other long term (current) drug therapy: Secondary | ICD-10-CM | POA: Diagnosis not present

## 2018-06-03 DIAGNOSIS — M542 Cervicalgia: Secondary | ICD-10-CM

## 2018-06-03 DIAGNOSIS — I2510 Atherosclerotic heart disease of native coronary artery without angina pectoris: Secondary | ICD-10-CM | POA: Insufficient documentation

## 2018-06-03 DIAGNOSIS — G309 Alzheimer's disease, unspecified: Secondary | ICD-10-CM | POA: Diagnosis not present

## 2018-06-03 DIAGNOSIS — R070 Pain in throat: Secondary | ICD-10-CM | POA: Diagnosis not present

## 2018-06-03 DIAGNOSIS — Z7901 Long term (current) use of anticoagulants: Secondary | ICD-10-CM | POA: Diagnosis not present

## 2018-06-03 DIAGNOSIS — I10 Essential (primary) hypertension: Secondary | ICD-10-CM | POA: Diagnosis not present

## 2018-06-03 DIAGNOSIS — M436 Torticollis: Secondary | ICD-10-CM | POA: Diagnosis not present

## 2018-06-03 DIAGNOSIS — E785 Hyperlipidemia, unspecified: Secondary | ICD-10-CM | POA: Insufficient documentation

## 2018-06-03 LAB — CBC WITH DIFFERENTIAL/PLATELET
Abs Immature Granulocytes: 0.03 10*3/uL (ref 0.00–0.07)
BASOS ABS: 0 10*3/uL (ref 0.0–0.1)
Basophils Relative: 0 %
EOS PCT: 0 %
Eosinophils Absolute: 0 10*3/uL (ref 0.0–0.5)
HEMATOCRIT: 43.8 % (ref 36.0–46.0)
HEMOGLOBIN: 14.5 g/dL (ref 12.0–15.0)
Immature Granulocytes: 0 %
LYMPHS PCT: 11 %
Lymphs Abs: 1.1 10*3/uL (ref 0.7–4.0)
MCH: 32 pg (ref 26.0–34.0)
MCHC: 33.1 g/dL (ref 30.0–36.0)
MCV: 96.7 fL (ref 80.0–100.0)
Monocytes Absolute: 1.2 10*3/uL — ABNORMAL HIGH (ref 0.1–1.0)
Monocytes Relative: 12 %
NEUTROS ABS: 7.3 10*3/uL (ref 1.7–7.7)
NRBC: 0 % (ref 0.0–0.2)
Neutrophils Relative %: 77 %
Platelets: 159 10*3/uL (ref 150–400)
RBC: 4.53 MIL/uL (ref 3.87–5.11)
RDW: 12.4 % (ref 11.5–15.5)
WBC: 9.7 10*3/uL (ref 4.0–10.5)

## 2018-06-03 LAB — TROPONIN I: Troponin I: <0.03 ng/mL (ref <0.03)

## 2018-06-03 LAB — BASIC METABOLIC PANEL
Anion gap: 11 (ref 5–15)
BUN: 7 mg/dL — ABNORMAL LOW (ref 8–23)
CHLORIDE: 103 mmol/L (ref 98–111)
CO2: 24 mmol/L (ref 22–32)
Calcium: 9.1 mg/dL (ref 8.9–10.3)
Creatinine, Ser: 0.73 mg/dL (ref 0.44–1.00)
GFR calc non Af Amer: >60 mL/min (ref >60)
Glucose, Bld: 132 mg/dL — ABNORMAL HIGH (ref 70–99)
POTASSIUM: 3.7 mmol/L (ref 3.5–5.1)
SODIUM: 138 mmol/L (ref 135–145)

## 2018-06-03 MED ORDER — CYCLOBENZAPRINE HCL 10 MG PO TABS: 10.0000 mg | ORAL_TABLET | Freq: Three times a day (TID) | ORAL | 0 refills | Status: DC | PRN

## 2018-06-03 MED ORDER — IOHEXOL 300 MG/ML  SOLN
75.0000 mL | Freq: Once | INTRAMUSCULAR | Status: AC | PRN
  Administered 2018-06-03: 75 mL via INTRAVENOUS

## 2018-06-03 MED ORDER — MORPHINE SULFATE (PF) 4 MG/ML IV SOLN
4.0000 mg | Freq: Once | INTRAVENOUS | Status: AC
  Administered 2018-06-03: 4 mg via INTRAVENOUS
  Filled 2018-06-03: qty 1

## 2018-06-03 NOTE — Discharge Instructions (Signed)
Flexeril as prescribed as needed for pain.  Continue other medications as previously prescribed.  Follow-up with your primary doctor if symptoms or not improving in the next 3 to 4 days, and return to the ER if symptoms significantly worsen or change.

## 2018-06-03 NOTE — ED Provider Notes (Signed)
[facility] EMERGENCY DEPARTMENT Provider Note   CSN: [id number] Arrival date & time: 06/03/18  0057    History   Chief Complaint Chief Complaint  Patient presents with  . Neck Pain    HPI [name] is a 85 y.o. female.     Patient is an 85 year old female with past medical history of hypertension, anxiety, chronic throat pain, hyperlipidemia, and osteoporosis.  She presents today for evaluation of neck pain.  This started 2 days ago in the absence of any injury or trauma.  She describes severe pain to both sides of her neck.  It is worse when she turns her head.  She denies any fevers or chills.  She denies any shortness of breath or cough.  The history is provided by the patient.  Neck Pain  Pain location:  L side and R side Quality:  Stiffness Stiffness is present:  All day Pain radiates to:  Does not radiate Pain severity:  Moderate Timing:  Constant Progression:  Worsening Chronicity:  New   Past Medical History:  Diagnosis Date  . Anxiety   . Atrophic vaginitis   . CAD (coronary artery disease) 2008/2010   Catheterization 2008, 40% LAD after first diagonal  /  nuclear January, 2010 normal  . Carotid artery disease ([place])    Doppler, July, 2011, 0-39% bilateral  . Decreased platelet count ([place]) 04/2011   142,000  . Endometrial polyp   . GERD (gastroesophageal reflux disease)   . Hyperlipidemia   . Hypertension   . Irritable bowel syndrome with constipation   . Memory difficulty 01-20-13   "memory issues"  . Normal nuclear stress test Feb 2013   No ischemia. Normal wall motion. EF greater than 70%  . Osteoarthritis   . Osteopenia   . Osteoporosis   . Palpitations    Monitor, May, 2013, no atrial fib,  one short run of PSVT  . Paroxysmal atrial fibrillation (HCC)    Coumadin therapy  . Polymyalgia rheumatica ([place])   . Spinal stenosis   . Stress   . Vitamin D deficiency     Patient Active Problem List   Diagnosis Date Noted   . Late onset Alzheimer's disease without behavioral disturbance ([place]) 09/07/2016  . Episodic lightheadedness 10/12/2015  . Hoarseness 10/12/2015  . Dizziness 06/19/2014  . Memory loss or impairment 07/02/2013  . Encounter for therapeutic drug monitoring 04/25/2013  . Palpitations   . Osteopenia   . Chest pain 04/07/2011  . Spinal stenosis   . Anxiety   . Warfarin anticoagulation   . GERD (gastroesophageal reflux disease)   . Osteoporosis   . Paroxysmal atrial fibrillation (HCC)   . Hypertension   . Hyperlipidemia   . Carotid artery disease ([place])   . CAD (coronary artery disease)   . Ejection fraction     Past Surgical History:  Procedure Laterality Date  . BREAST SURGERY     BENIGN BREAST LUMP  . CATARACT SURGERY  2009  . COLONOSCOPY WITH PROPOFOL N/A 02/11/2013   Procedure: COLONOSCOPY WITH PROPOFOL;  Surgeon: [name], MD;  Location: [facility] ENDOSCOPY;  Service: Endoscopy;  Laterality: N/A;  . DILATION AND CURETTAGE OF UTERUS    . EXCISION MORTON'S NEUROMA  2000  . HIP SURGERY Right 2005   debridement  . HYSTEROSCOPY  02/07/2005   HYST W D&C, DX: PMB W SMALL ENDO POLYP-SURGEON [name]  . LAMINECTOMY  March 2009   L4 through L5  . TONSILLECTOMY  OB History    Gravida  4   Para  2   Term  2   Preterm      AB  2   Living  2     SAB      TAB      Ectopic      Multiple      Live Births               Home Medications    Prior to Admission medications   Medication Sig Start Date End Date Taking? Authorizing Provider  Cholecalciferol (VITAMIN D3) 1000 units CAPS Take 1,000 Units by mouth daily.    [provider]  ELIQUIS 5 MG TABS tablet Take 5 mg by mouth 2 (two) times daily. 12/27/17   [provider]  LUTEIN PO Take by mouth. Take 1 tablet by mouth every Tue, [place], Sat.    [provider]  Melatonin 1 MG TABS Take 1 tablet by mouth at bedtime as needed.     [provider]  Memantine  HCl-Donepezil HCl (NAMZARIC) 28-10 MG CP24 Take 1 capsule by mouth every morning. 07/09/17   Ward Givens, NP  estradiol (ESTRACE) 1 MG tablet Take 1 mg by mouth as needed.   03/16/11  [provider]    Family History Family History  Problem Relation Age of Onset  . Transient ischemic attack Mother   . Stroke Mother   . Cancer Father        PROSTATE  . Pancreatitis Father   . Stroke Sister   . Diabetes Sister   . Hypertension Sister   . Heart disease Sister   . Dementia Sister   . Spina bifida Brother   . Hypertension Brother   . Heart disease Brother     Social History Social History   Tobacco Use  . Smoking status: Never Smoker  . Smokeless tobacco: Never Used  Substance Use Topics  . Alcohol use: No  . Drug use: No     Allergies   Patient has no known allergies.   Review of Systems Review of Systems  Musculoskeletal: Positive for neck pain.  All other systems reviewed and are negative.    Physical Exam Updated Vital Signs BP (!) 145/81   Pulse (!) 105   Temp (!) 97.5 F (36.4 C) (Oral)   Resp (!) 22   Ht 5\' 2"  (1.575 m)   Wt 48.1 kg   SpO2 98%   BMI 19.39 kg/m   Physical Exam Vitals signs and nursing note reviewed.  Constitutional:      General: She is not in acute distress.    Appearance: She is well-developed. She is not diaphoretic.  HENT:     Head: Normocephalic and atraumatic.  Neck:     Musculoskeletal: Normal range of motion. Neck rigidity and muscular tenderness present.     Comments: There is tenderness to the soft tissues of both sides of the neck.  There is no palpable or visible abnormality. Cardiovascular:     Rate and Rhythm: Normal rate and regular rhythm.     Heart sounds: No murmur. No friction rub. No gallop.   Pulmonary:     Effort: Pulmonary effort is normal. No respiratory distress.     Breath sounds: Normal breath sounds. No wheezing.  Abdominal:     General: Bowel sounds are normal. There is no distension.      Palpations: Abdomen is soft.     Tenderness: There is  no abdominal tenderness.  Musculoskeletal: Normal range of motion.  Skin:    General: Skin is warm and dry.  Neurological:     General: No focal deficit present.     Mental Status: She is alert and oriented to person, place, and time.     Cranial Nerves: No cranial nerve deficit.     Sensory: No sensory deficit.     Motor: No weakness.     Coordination: Coordination normal.      ED Treatments / Results  Labs (all labs ordered are listed, but only abnormal results are displayed) Labs Reviewed  BASIC METABOLIC PANEL  CBC WITH DIFFERENTIAL/PLATELET    EKG None  Radiology No results found.  Procedures Procedures (including critical care time)  Medications Ordered in ED Medications  morphine 4 MG/ML injection 4 mg (has no administration in time range)     Initial Impression / Assessment and Plan / ED Course  I have reviewed the triage vital signs and the nursing notes.  Pertinent labs & imaging results that were available during my care of the patient were reviewed by me and considered in my medical decision making (see chart for details).  Patient presents here with complaints of neck pain for the past 2 days that began in the absence of any injury or trauma.  Her symptoms appear musculoskeletal.  CT scan of the neck is unremarkable.  Patient will be treated with muscle relaxers, rest, and follow-up as needed.  Final Clinical Impressions(s) / ED Diagnoses   Final diagnoses:  None    ED Discharge Orders    None       Veryl Speak, MD 06/03/18 [phone]

## 2018-06-03 NOTE — ED Notes (Signed)
Patient transported to CT 

## 2018-06-03 NOTE — ED Triage Notes (Signed)
Pt is mostly concerned about her neck pain. Continuously guards that area.

## 2018-06-03 NOTE — ED Triage Notes (Signed)
Pt from home w/ a c/o a chronic sore throat (years), neck pain, chest pain, head pain, jaw pain, and left arm pain. Her chest pain is generalized and feels tight and constant. Her chest pain is not reproducible upon palpation.Tenderness to palpation of her neck and jaw. Neck pain is constant, whereas the left arm pain is intermittent. Pt denies recent injury or illness. Additional complaints of feeling like her heart is racing. No N/V, SOB, or LOC.

## 2018-07-02 DIAGNOSIS — J029 Acute pharyngitis, unspecified: Secondary | ICD-10-CM | POA: Diagnosis not present

## 2018-07-04 ENCOUNTER — Ambulatory Visit: Payer: Medicare Other | Admitting: Neurology

## 2018-07-08 ENCOUNTER — Emergency Department (HOSPITAL_COMMUNITY)
Admission: EM | Admit: 2018-07-08 | Discharge: 2018-07-08 | Disposition: A | Discharge status: Home / Self Care | Payer: Medicare Other | Attending: Emergency Medicine | Admitting: Emergency Medicine

## 2018-07-08 ENCOUNTER — Emergency Department (HOSPITAL_COMMUNITY): Payer: Medicare Other

## 2018-07-08 ENCOUNTER — Encounter (HOSPITAL_COMMUNITY): Payer: Self-pay | Admitting: Emergency Medicine

## 2018-07-08 ENCOUNTER — Other Ambulatory Visit: Payer: Self-pay

## 2018-07-08 DIAGNOSIS — K219 Gastro-esophageal reflux disease without esophagitis: Secondary | ICD-10-CM | POA: Diagnosis not present

## 2018-07-08 DIAGNOSIS — R42 Dizziness and giddiness: Secondary | ICD-10-CM | POA: Insufficient documentation

## 2018-07-08 DIAGNOSIS — J029 Acute pharyngitis, unspecified: Secondary | ICD-10-CM

## 2018-07-08 DIAGNOSIS — I10 Essential (primary) hypertension: Secondary | ICD-10-CM | POA: Insufficient documentation

## 2018-07-08 DIAGNOSIS — I4891 Unspecified atrial fibrillation: Secondary | ICD-10-CM | POA: Diagnosis not present

## 2018-07-08 DIAGNOSIS — I2510 Atherosclerotic heart disease of native coronary artery without angina pectoris: Secondary | ICD-10-CM | POA: Diagnosis not present

## 2018-07-08 DIAGNOSIS — F0390 Unspecified dementia without behavioral disturbance: Secondary | ICD-10-CM | POA: Diagnosis not present

## 2018-07-08 LAB — COMPREHENSIVE METABOLIC PANEL
ALT: 13 U/L (ref 0–44)
AST: 21 U/L (ref 15–41)
Albumin: 3.9 g/dL (ref 3.5–5.0)
Alkaline Phosphatase: 53 U/L (ref 38–126)
Anion gap: 8 (ref 5–15)
BUN: 11 mg/dL (ref 8–23)
CO2: 27 mmol/L (ref 22–32)
Calcium: 9.3 mg/dL (ref 8.9–10.3)
Chloride: 107 mmol/L (ref 98–111)
Creatinine, Ser: 0.79 mg/dL (ref 0.44–1.00)
GFR calc Af Amer: >60 mL/min (ref >60)
GFR calc non Af Amer: >60 mL/min (ref >60)
Glucose, Bld: 93 mg/dL (ref 70–99)
Potassium: 4 mmol/L (ref 3.5–5.1)
Sodium: 142 mmol/L (ref 135–145)
Total Bilirubin: 1.2 mg/dL (ref 0.3–1.2)
Total Protein: 6.6 g/dL (ref 6.5–8.1)

## 2018-07-08 LAB — URINALYSIS, ROUTINE W REFLEX MICROSCOPIC
Bilirubin Urine: NEGATIVE
Glucose, UA: NEGATIVE mg/dL
Hgb urine dipstick: NEGATIVE
Ketones, ur: NEGATIVE mg/dL
Leukocytes,Ua: NEGATIVE
Nitrite: NEGATIVE
Protein, ur: NEGATIVE mg/dL
Specific Gravity, Urine: 1.005 (ref 1.005–1.030)
pH: 8 (ref 5.0–8.0)

## 2018-07-08 LAB — CBC WITH DIFFERENTIAL/PLATELET
Abs Immature Granulocytes: 0.02 10*3/uL (ref 0.00–0.07)
Basophils Absolute: 0 10*3/uL (ref 0.0–0.1)
Basophils Relative: 1 %
Eosinophils Absolute: 0.1 10*3/uL (ref 0.0–0.5)
Eosinophils Relative: 2 %
HCT: 48.2 % — ABNORMAL HIGH (ref 36.0–46.0)
Hemoglobin: 15.3 g/dL — ABNORMAL HIGH (ref 12.0–15.0)
Immature Granulocytes: 0 %
Lymphocytes Relative: 24 %
Lymphs Abs: 1.2 10*3/uL (ref 0.7–4.0)
MCH: 31.1 pg (ref 26.0–34.0)
MCHC: 31.7 g/dL (ref 30.0–36.0)
MCV: 98 fL (ref 80.0–100.0)
Monocytes Absolute: 0.6 10*3/uL (ref 0.1–1.0)
Monocytes Relative: 11 %
Neutro Abs: 3.1 10*3/uL (ref 1.7–7.7)
Neutrophils Relative %: 62 %
Platelets: 175 10*3/uL (ref 150–400)
RBC: 4.92 MIL/uL (ref 3.87–5.11)
RDW: 12.5 % (ref 11.5–15.5)
WBC: 5 10*3/uL (ref 4.0–10.5)
nRBC: 0 % (ref 0.0–0.2)

## 2018-07-08 LAB — TROPONIN I: Troponin I: <0.03 ng/mL (ref <0.03)

## 2018-07-08 MED ORDER — FAMOTIDINE 20 MG PO TABS: 20.0000 mg | ORAL_TABLET | Freq: Two times a day (BID) | ORAL | 0 refills | Status: DC

## 2018-07-08 MED ORDER — SODIUM CHLORIDE 0.9 % IV BOLUS
500.0000 mL | Freq: Once | INTRAVENOUS | Status: AC
  Administered 2018-07-08: 500 mL via INTRAVENOUS

## 2018-07-08 NOTE — ED Triage Notes (Signed)
Pt reports feeling light headed and off balance for over 1 month pt states over the last few days this feeling has became constant and worse. Pt also report low back pain that radiates into left leg as well as a sore throat. Pt is alert and ox4. Denies any syncope or CP.

## 2018-07-08 NOTE — Discharge Instructions (Addendum)
Your labs and chest x-ray look great today.  Since the symptoms have been occurring for the past year I want you to follow-up with your primary care doctor and ENT doctor regarding this.  Please begin taking Pepcid for acid reflux twice a day before breakfast and bedtime.

## 2018-07-08 NOTE — ED Provider Notes (Signed)
Care assumed from Dr. Vonna Kotyk long as patient was transferred to yellow zone.  Please see his note for full details, but in brief [name] is a 85 y.o. female who presents for evaluation of dizziness and sore throat.  The symptoms have been chronic and occurring over the past year but patient reports they have been slightly worse over the past month.  Patient has seen the ENT and her PCP regarding the symptoms.  She is also been prescribed medications for GERD which may be contributing but has not been taking them.  Exam is reassuring and this is thought to be a chronic issue.  Basic labs and chest x-ray pending at time of signout.    Plan: reassurance and to start patient back on GERD medications, ENT follow-up recommended.  Physical Exam  BP 103/90 (BP Location: Right Arm)   Pulse 67   Temp 97.7 F (36.5 C) (Oral)   Resp 16   SpO2 100%   Physical Exam Vitals signs and nursing note reviewed.  Constitutional:      General: She is not in acute distress.    Appearance: She is well-developed. She is not diaphoretic.  HENT:     Head: Normocephalic and atraumatic.     Mouth/Throat:     Mouth: Mucous membranes are moist.     Pharynx: Oropharynx is clear.     Tonsils: No tonsillar exudate.     Comments: Tonsils Absent Eyes:     General:        Right eye: No discharge.        Left eye: No discharge.  Cardiovascular:     Rate and Rhythm: Rhythm irregular.     Comments: Afib w/ normal rate Pulmonary:     Effort: Pulmonary effort is normal. No respiratory distress.  Skin:    General: Skin is warm and dry.  Neurological:     Mental Status: She is alert and oriented to person, place, and time.     Coordination: Coordination normal.  Psychiatric:        Mood and Affect: Mood normal.        Behavior: Behavior normal.     ED Course/Procedures   Labs Reviewed  CBC WITH DIFFERENTIAL/PLATELET - Abnormal; Notable for the following components:      Result Value   Hemoglobin 15.3  (*)    HCT 48.2 (*)    All other components within normal limits  URINALYSIS, ROUTINE W REFLEX MICROSCOPIC - Abnormal; Notable for the following components:   Color, Urine STRAW (*)    All other components within normal limits  COMPREHENSIVE METABOLIC PANEL  TROPONIN I    Dg Chest Portable 1 View  Result Date: 07/08/2018 CLINICAL DATA:  Lightheadedness with balance difficulties for 1 month. History of hypertension. EXAM: PORTABLE CHEST 1 VIEW COMPARISON:  Radiographs 09/03/2017. FINDINGS: 1138 hours. The heart size and mediastinal contours are normal. The lungs are clear. There is no pleural effusion or pneumothorax. No acute osseous findings are identified. There are diffuse calcifications of the tracheobronchial tree. Multiple telemetry leads overlie the chest. IMPRESSION: Stable chest.  No active cardiopulmonary process. Electronically Signed   By: Richardean Sale M.D.   On: 07/08/2018 12:24   EKG Interpretation  Date/Time:  Monday July 08 2018 09:59:54 EDT Ventricular Rate:  75 PR Interval:    QRS Duration: 74 QT Interval:  370 QTC Calculation: 414 R Axis:   78 Text Interpretation:  Atrial fibrillation Anterior infarct, old Borderline ST depression, diffuse  leads No STEMI.  Confirmed by Nanda Quinton [phone]) on 07/08/2018 10:02:15 AM  Procedures  MDM   Patient's work-up is reassuring today, labs and urinalysis are unremarkable and chest x-ray shows no active cardiopulmonary disease.  A. fib on EKG with old borderline ST depressions and no new significant changes.  Sore throat and intermittent dizziness have been present for over a year now and she has been seen by ENT and her PCP regarding these, she does not have any acute changes to her exam or labs to suggest further work-up is required at this time.  Patient does have a history of GERD but is not currently taking these medications and I do think this could be contributing to her sore throat.  Will start patient on Pepcid twice  daily and have her follow-up with Dr. Redmond Baseman with ENT who she has seen previously as well as her PCP.  Return precautions discussed.  At this time patient is stable for discharge home.       Jacqlyn Larsen, PA-C 07/08/18 1310    Margette Fast, MD 07/08/18 2024

## 2018-07-08 NOTE — ED Provider Notes (Signed)
Emergency Department Provider Note   I have reviewed the triage vital signs and the nursing notes.   HISTORY  Chief Complaint Dizziness and Sore Throat   HPI [name] is a 85 y.o. female with PMH of CAD, GERD, HTN, HLD, and PAF on Eliquis presents to the ED with dizziness and sore throat.  Patient has had chronic similar symptoms over the past year or more.  She states that symptoms are worsening over the last month.  She feels lightheaded at times and notes sore, scratchy throat.  She feels like her voice is not normal for her but that this is been ongoing for months.  She reports frequent belching.  Denies any chest pain or shortness of breath.  No heart palpitations.  No fevers.  No abdominal pain.  Denies any syncope.  She has followed with ENT and her PCP over the past year for this.  No clear diagnosis has been made.  She states "I just get some pills and they send me home."  She has been prescribed medications for GERD but states she is not taking them currently.   Past Medical History:  Diagnosis Date  . Anxiety   . Atrophic vaginitis   . CAD (coronary artery disease) 2008/2010   Catheterization 2008, 40% LAD after first diagonal  /  nuclear January, 2010 normal  . Carotid artery disease ([place])    Doppler, July, 2011, 0-39% bilateral  . Decreased platelet count ([place]) 04/2011   142,000  . Endometrial polyp   . GERD (gastroesophageal reflux disease)   . Hyperlipidemia   . Hypertension   . Irritable bowel syndrome with constipation   . Memory difficulty 01-20-13   "memory issues"  . Normal nuclear stress test Feb 2013   No ischemia. Normal wall motion. EF greater than 70%  . Osteoarthritis   . Osteopenia   . Osteoporosis   . Palpitations    Monitor, May, 2013, no atrial fib,  one short run of PSVT  . Paroxysmal atrial fibrillation (HCC)    Coumadin therapy  . Polymyalgia rheumatica ([place])   . Spinal stenosis   . Stress   . Vitamin D deficiency     Patient  Active Problem List   Diagnosis Date Noted  . Late onset Alzheimer's disease without behavioral disturbance ([place]) 09/07/2016  . Episodic lightheadedness 10/12/2015  . Hoarseness 10/12/2015  . Dizziness 06/19/2014  . Memory loss or impairment 07/02/2013  . Encounter for therapeutic drug monitoring 04/25/2013  . Palpitations   . Osteopenia   . Chest pain 04/07/2011  . Spinal stenosis   . Anxiety   . Warfarin anticoagulation   . GERD (gastroesophageal reflux disease)   . Osteoporosis   . Paroxysmal atrial fibrillation (HCC)   . Hypertension   . Hyperlipidemia   . Carotid artery disease ([place])   . CAD (coronary artery disease)   . Ejection fraction     Past Surgical History:  Procedure Laterality Date  . BREAST SURGERY     BENIGN BREAST LUMP  . CATARACT SURGERY  2009  . COLONOSCOPY WITH PROPOFOL N/A 02/11/2013   Procedure: COLONOSCOPY WITH PROPOFOL;  Surgeon: [name], MD;  Location: [facility];  Service: Endoscopy;  Laterality: N/A;  . DILATION AND CURETTAGE OF UTERUS    . EXCISION MORTON'S NEUROMA  2000  . HIP SURGERY Right 2005   debridement  . HYSTEROSCOPY  02/07/2005   HYST W D&C, DX: PMB W SMALL ENDO POLYP-SURGEON [name]  . LAMINECTOMY  March 2009   L4 through L5  . TONSILLECTOMY      Allergies Patient has no known allergies.  Family History  Problem Relation Age of Onset  . Transient ischemic attack Mother   . Stroke Mother   . Cancer Father        PROSTATE  . Pancreatitis Father   . Stroke Sister   . Diabetes Sister   . Hypertension Sister   . Heart disease Sister   . Dementia Sister   . Spina bifida Brother   . Hypertension Brother   . Heart disease Brother     Social History Social History   Tobacco Use  . Smoking status: Never Smoker  . Smokeless tobacco: Never Used  Substance Use Topics  . Alcohol use: No  . Drug use: No    Review of Systems  Constitutional: No fever/chills. Positive lightheadedness.  Eyes: No visual  changes. ENT: Positive sore throat. Cardiovascular: Denies chest pain. Respiratory: Denies shortness of breath. Gastrointestinal: No abdominal pain.  No nausea, no vomiting.  No diarrhea.  No constipation. Genitourinary: Negative for dysuria. Musculoskeletal: Negative for back pain. Skin: Negative for rash. Neurological: Negative for headaches, focal weakness or numbness.  10-point ROS otherwise negative.  ____________________________________________   PHYSICAL EXAM:  VITAL SIGNS: ED Triage Vitals  Enc Vitals Group     BP 07/08/18 1001 103/90     Pulse Rate 07/08/18 1001 67     Resp 07/08/18 1001 16     Temp 07/08/18 1001 97.7 F (36.5 C)     Temp Source 07/08/18 1001 Oral     SpO2 07/08/18 1001 100 %     Pain Score 07/08/18 1002 5   Constitutional: Alert and oriented. Well appearing and in no acute distress. Eyes: Conjunctivae are normal.  Head: Atraumatic. Nose: No congestion/rhinnorhea. Mouth/Throat: Mucous membranes are moist.  Oropharynx without erythema or exudate. No PTA. Speaking in a clear voice and managing oral secretions.  Neck: No stridor.  Cardiovascular: Normal rate, regular rhythm. Good peripheral circulation. Grossly normal heart sounds.   Respiratory: Normal respiratory effort.  No retractions. Lungs CTAB. Gastrointestinal: Soft and nontender. No distention.  Musculoskeletal: No lower extremity tenderness nor edema. No gross deformities of extremities. Neurologic:  Normal speech and language. No gross focal neurologic deficits are appreciated. No facial asymmetry.  Skin:  Skin is warm, dry and intact. No rash noted.   ____________________________________________   LABS (all labs ordered are listed, but only abnormal results are displayed)  Labs Reviewed  CBC WITH DIFFERENTIAL/PLATELET - Abnormal; Notable for the following components:      Result Value   Hemoglobin 15.3 (*)    HCT 48.2 (*)    All other components within normal limits  URINALYSIS,  ROUTINE W REFLEX MICROSCOPIC - Abnormal; Notable for the following components:   Color, Urine STRAW (*)    All other components within normal limits  COMPREHENSIVE METABOLIC PANEL  TROPONIN I   ____________________________________________  EKG   EKG Interpretation  Date/Time:  Monday July 08 2018 09:59:54 EDT Ventricular Rate:  75 PR Interval:    QRS Duration: 74 QT Interval:  370 QTC Calculation: 414 R Axis:   78 Text Interpretation:  Atrial fibrillation Anterior infarct, old Borderline ST depression, diffuse leads No STEMI.  Confirmed by Nanda Quinton [id number] on 07/08/2018 10:02:15 AM       ____________________________________________  RADIOLOGY  Dg Chest Portable 1 View  Result Date: 07/08/2018 CLINICAL DATA:  Lightheadedness with balance difficulties for 1 month.  History of hypertension. EXAM: PORTABLE CHEST 1 VIEW COMPARISON:  Radiographs 09/03/2017. FINDINGS: 1138 hours. The heart size and mediastinal contours are normal. The lungs are clear. There is no pleural effusion or pneumothorax. No acute osseous findings are identified. There are diffuse calcifications of the tracheobronchial tree. Multiple telemetry leads overlie the chest. IMPRESSION: Stable chest.  No active cardiopulmonary process. Electronically Signed   By: Richardean Sale M.D.   On: 07/08/2018 12:24    ____________________________________________   PROCEDURES  Procedure(s) performed:   Procedures  None ____________________________________________   INITIAL IMPRESSION / ASSESSMENT AND PLAN / ED COURSE  Pertinent labs & imaging results that were available during my care of the patient were reviewed by me and considered in my medical decision making (see chart for details).   Patient presents to the emergency department for feeling off balance and sore throat.  Symptoms have been going on for over 1 year and worsening over the past month.  Patient's oropharyngeal exam is unremarkable.  The tonsils  are surgically absent.  She has not experienced any chest pain or syncope.  She has a normal neurologic exam.  Extremely low suspicion for stroke.  Plan for IV fluids, orthostatic vitals, chest x-ray, lab work.  EKG with no acute ischemic changes.   CXR and labs reviewed. No acute process. Plan for outpatient f/u with PCP and ENT.  ____________________________________________  FINAL CLINICAL IMPRESSION(S) / ED DIAGNOSES  Final diagnoses:  Dizziness  Sore throat  Gastroesophageal reflux disease, esophagitis presence not specified     MEDICATIONS GIVEN DURING THIS VISIT:  Medications  sodium chloride 0.9 % bolus 500 mL (0 mLs Intravenous Stopped 07/08/18 1331)     NEW OUTPATIENT MEDICATIONS STARTED DURING THIS VISIT:  Discharge Medication List as of 07/08/2018 12:59 PM    START taking these medications   Details  famotidine (PEPCID) 20 MG tablet Take 1 tablet (20 mg total) by mouth 2 (two) times daily., Starting Mon 07/08/2018, Normal        Note:  This document was prepared using Dragon voice recognition software and may include unintentional dictation errors.  Nanda Quinton, MD Emergency Medicine    Nel Stoneking, [facility], MD 07/08/18 2013

## 2018-07-08 NOTE — ED Notes (Addendum)
Nurse navigator spoke with EDP and Jeneen Rinks husband [phone] of patient regarding patient plan of care who is waiting in is car in the hospital parking lot. He verbalized understanding and thanked nurse for the update.

## 2018-07-08 NOTE — ED Notes (Signed)
Patient verbalizes understanding of discharge instructions. Opportunity for questioning and answers were provided. Armband removed by staff, pt discharged from ED.  

## 2018-07-08 NOTE — ED Notes (Signed)
Pt ambulated to bathroom with one assist and unsteady gait.

## 2018-07-08 NOTE — ED Notes (Signed)
Pt's sps. Outside of ED Lobby, does not have phone for contact. Sps. Will wait outside for Pt's status

## 2018-07-08 NOTE — ED Notes (Signed)
ED Provider at bedside. 

## 2018-07-09 DIAGNOSIS — J029 Acute pharyngitis, unspecified: Secondary | ICD-10-CM | POA: Diagnosis not present

## 2018-07-09 DIAGNOSIS — M25552 Pain in left hip: Secondary | ICD-10-CM | POA: Diagnosis not present

## 2018-07-16 DIAGNOSIS — M7062 Trochanteric bursitis, left hip: Secondary | ICD-10-CM | POA: Diagnosis not present

## 2018-07-17 DIAGNOSIS — M7062 Trochanteric bursitis, left hip: Secondary | ICD-10-CM | POA: Diagnosis not present

## 2018-07-30 ENCOUNTER — Other Ambulatory Visit: Payer: Self-pay | Admitting: Adult Health

## 2018-07-30 DIAGNOSIS — G309 Alzheimer's disease, unspecified: Secondary | ICD-10-CM

## 2018-07-30 DIAGNOSIS — R070 Pain in throat: Secondary | ICD-10-CM | POA: Diagnosis not present

## 2018-07-30 DIAGNOSIS — F0280 Dementia in other diseases classified elsewhere without behavioral disturbance: Secondary | ICD-10-CM

## 2018-08-21 DIAGNOSIS — J029 Acute pharyngitis, unspecified: Secondary | ICD-10-CM | POA: Diagnosis not present

## 2018-09-10 ENCOUNTER — Ambulatory Visit: Payer: Medicare Other | Admitting: Adult Health

## 2018-09-20 ENCOUNTER — Ambulatory Visit: Payer: Medicare Other | Admitting: Registered Nurse

## 2018-09-20 DIAGNOSIS — R42 Dizziness and giddiness: Secondary | ICD-10-CM | POA: Diagnosis not present

## 2018-09-20 DIAGNOSIS — M25562 Pain in left knee: Secondary | ICD-10-CM | POA: Diagnosis not present

## 2018-09-20 DIAGNOSIS — I482 Chronic atrial fibrillation, unspecified: Secondary | ICD-10-CM | POA: Diagnosis not present

## 2018-09-20 DIAGNOSIS — I10 Essential (primary) hypertension: Secondary | ICD-10-CM | POA: Diagnosis not present

## 2018-09-20 DIAGNOSIS — D6869 Other thrombophilia: Secondary | ICD-10-CM | POA: Diagnosis not present

## 2018-09-20 DIAGNOSIS — G301 Alzheimer's disease with late onset: Secondary | ICD-10-CM | POA: Diagnosis not present

## 2018-09-20 DIAGNOSIS — J029 Acute pharyngitis, unspecified: Secondary | ICD-10-CM | POA: Diagnosis not present

## 2018-10-15 ENCOUNTER — Encounter: Payer: Self-pay | Admitting: Sports Medicine

## 2018-10-15 ENCOUNTER — Ambulatory Visit (INDEPENDENT_AMBULATORY_CARE_PROVIDER_SITE_OTHER): Payer: Medicare Other | Admitting: Sports Medicine

## 2018-10-15 ENCOUNTER — Other Ambulatory Visit: Payer: Self-pay

## 2018-10-15 ENCOUNTER — Ambulatory Visit: Payer: Self-pay

## 2018-10-15 VITALS — BP 128/78 | Ht 62.0 in | Wt 105.0 lb

## 2018-10-15 DIAGNOSIS — M25562 Pain in left knee: Secondary | ICD-10-CM | POA: Diagnosis not present

## 2018-10-15 DIAGNOSIS — M23301 Other meniscus derangements, unspecified lateral meniscus, left knee: Secondary | ICD-10-CM

## 2018-10-15 NOTE — Patient Instructions (Signed)
Your knee pain is caused by mild arthritis as well as a degenerative meniscus tear  To help with your pain you should work on knee and hip strengthening exercises -Knee extension exercises -Knee flexion exercises -Hip flexion exercises  If your pain does not improve within the next 4-6 weeks, you may follow up in clinic and we can give you a steroid injection in your knee to help with pain.

## 2018-10-15 NOTE — Progress Notes (Signed)
PCP: Donna Manes, MD  Subjective:   HPI: Patient is a 85 y.o. female here for evaluation of left knee pain.  Patient's knee pain is been present for the last 2 to 3 weeks.  She denies any injury or trauma to her knee.  She denies any swelling or bruising of her knee.  Patient's pain is located along the medial lateral joint lines.  The pain does not radiate.  Has an aching quality.  Patient denies any hip pain or ankle pain.  Patient notes the pain is gotten progressively worse over the last several weeks.  She has not seen a physician yet for this pain.  Patient notes pain with walking as well as pain with stairs.   Review of Systems: See HPI above.  Past Medical History:  Diagnosis Date  . Anxiety   . Atrophic vaginitis   . CAD (coronary artery disease) 2008/2010   Catheterization 2008, 40% LAD after first diagonal  /  nuclear January, 2010 normal  . Carotid artery disease ([place])    Doppler, July, 2011, 0-39% bilateral  . Decreased platelet count ([place]) 04/2011   142,000  . Endometrial polyp   . GERD (gastroesophageal reflux disease)   . Hyperlipidemia   . Hypertension   . Irritable bowel syndrome with constipation   . Memory difficulty 01-20-13   "memory issues"  . Normal nuclear stress test Feb 2013   No ischemia. Normal wall motion. EF greater than 70%  . Osteoarthritis   . Osteopenia   . Osteoporosis   . Palpitations    Monitor, May, 2013, no atrial fib,  one short run of PSVT  . Paroxysmal atrial fibrillation ([facility])    Coumadin therapy  . Polymyalgia rheumatica ([place])   . Spinal stenosis   . Stress   . Vitamin D deficiency     Current Outpatient Medications on File Prior to Visit  Medication Sig Dispense Refill  . ALENDRONATE SODIUM PO Take 70 mg by mouth once a week.    . Cholecalciferol (VITAMIN D3) 1000 units CAPS Take 1,000 Units by mouth daily.    . cyclobenzaprine (FLEXERIL) 10 MG tablet Take 1 tablet (10 mg total) by mouth 3 (three) times daily as needed  for muscle spasms. 20 tablet 0  . ELIQUIS 5 MG TABS tablet Take 5 mg by mouth 2 (two) times daily.    . famotidine (PEPCID) 20 MG tablet Take 1 tablet (20 mg total) by mouth 2 (two) times daily. 30 tablet 0  . LUTEIN PO Take by mouth. Take 1 tablet by mouth every Tue, [place], Sat.    . Melatonin 1 MG TABS Take 1 tablet by mouth at bedtime as needed.     [name] NAMZARIC 28-10 MG CP24 TAKE ONE CAPSULE BY MOUTH EVERY MORNING 30 capsule 2  . [DISCONTINUED] estradiol (ESTRACE) 1 MG tablet Take 1 mg by mouth as needed.      No current facility-administered medications on file prior to visit.     Past Surgical History:  Procedure Laterality Date  . BREAST SURGERY     BENIGN BREAST LUMP  . CATARACT SURGERY  2009  . COLONOSCOPY WITH PROPOFOL N/A 02/11/2013   Procedure: COLONOSCOPY WITH PROPOFOL;  Surgeon: [name], MD;  Location: [facility];  Service: Endoscopy;  Laterality: N/A;  . DILATION AND CURETTAGE OF UTERUS    . EXCISION MORTON'S NEUROMA  2000  . HIP SURGERY Right 2005   debridement  . HYSTEROSCOPY  02/07/2005   HYST W  D&C, DX: PMB W SMALL ENDO POLYP-SURGEON [name]  . LAMINECTOMY  March 2009   L4 through L5  . TONSILLECTOMY      No Known Allergies  Social History   Socioeconomic History  . Marital status: Married    Spouse name: [name]  . Number of children: 2  . Years of education: Masters  . Highest education level: Not on file  Occupational History  . Occupation: Retired    Fish farm manager: RETIRED  Social Needs  . Financial resource strain: Not on file  . Food insecurity    Worry: Not on file    Inability: Not on file  . Transportation needs    Medical: Not on file    Non-medical: Not on file  Tobacco Use  . Smoking status: Never Smoker  . Smokeless tobacco: Never Used  Substance and Sexual Activity  . Alcohol use: No  . Drug use: No  . Sexual activity: Never    Birth control/protection: Post-menopausal  Lifestyle  . Physical activity    Days per week: Not  on file    Minutes per session: Not on file  . Stress: Not on file  Relationships  . Social Herbalist on phone: Not on file    Gets together: Not on file    Attends religious service: Not on file    Active member of club or organization: Not on file    Attends meetings of clubs or organizations: Not on file    Relationship status: Not on file  . Intimate partner violence    Fear of current or ex partner: Not on file    Emotionally abused: Not on file    Physically abused: Not on file    Forced sexual activity: Not on file  Other Topics Concern  . Not on file  Social History Narrative   Patient is married [name]) and lives at home with her husband.   Patient has two children.   Retired; Married;   Patient is right-handed.   Patient drinks very little caffeine.   Patient has a Scientist, water quality.   Has had increased stress as her husband had a stroke ~ 3wks ago and she has been taking care of him. (04/07/11)    Family History  Problem Relation Age of Onset  . Transient ischemic attack Mother   . Stroke Mother   . Cancer Father        PROSTATE  . Pancreatitis Father   . Stroke Sister   . Diabetes Sister   . Hypertension Sister   . Heart disease Sister   . Dementia Sister   . Spina bifida Brother   . Hypertension Brother   . Heart disease Brother         Objective:  Physical Exam: BP 128/78   Ht 5\' 2"  (1.575 m)   Wt 105 lb (47.6 kg)   BMI 19.20 kg/m  Gen: NAD, comfortable in exam room Lungs: Breathing comfortably on room air Knee Exam Left -Inspection: no deformity, no discoloration. Mild warmth compared to contralateral knee -Palpation: Medial lateral joint lines tender to palpation -ROM: Extension: 5 degrees; Flexion: 150 degrees.  Patient had pain past 120 degrees of flexion -Strength: Knee Extension: 4 /5; Flexion: 4 /5; hip flexion: 4 out of 5; hip abduction: 4-5 -Special Tests: Varus Stress: Negative; Valgus Stress: Negative; Lachman: Negative;  Posterior drawer: Negative; McMurray: Negative; Thessaly: Negative; Patellar grind: Negative -Limb neurovascularly intact  Contralateral Knee -Inspection: no deformity, no discoloration -Palpation:  medial joint line: Non-tender; lateral joint line: non-tender; quad tendon: non-tender; patella: non-tender; patellar tendon: non-tendon -ROM: Extension: 5 degrees; Flexion: 150 degrees -Strength: Extension: 5/5; Flexion: 5/5 -Limb neurovascularly intact  Limited diagnostic ultrasound of the left knee Findings: - Suprapatellar pouch: Moderate-sized joint effusion noted left knee.  Normal appearance of quadricep tendon.  Patient without joint effusion of her right knee - Patellar tendon: Normal appearance of the patellar tendon both the origin insertion as you and long axis - Medial joint line: Normal appearance of the medial joint space with normal-appearing meniscus -Lateral joint line: Degenerative tear noted of the lateral meniscus with associated cyst formation  Impression:  Degenerative meniscus tear of left knee with moderate joint effusion  Ultrasound and interpretation by Dr. Sheppard Coil and  Wolfgang Phoenix. Fields, MD      Assessment & Plan:  Patient is a 85 y.o. female here for evaluation of left knee pain  1 left knee pain - Patient with degenerative lateral meniscus tear as seen on ultrasound.  Patient also with likely underlying mild arthritis.  On exam patient was found to have weakness at both her knee and her hip -Patient was given strengthening exercises for both her knee and her hip. Patient to do these exercises twice a day for the next 4 to 6 weeks - Patient has no resolution of her symptoms by next appointment we will consider corticosteroid injection of her left knee Trial with compression sleeve  Patient to follow-up in 4 to 6 weeks and will use CSI if not improving  I observed and examined the patient with Dr. Margy Clarks and agree with assessment and plan.  Note reviewed  and modified by me. Ila Mcgill, MD

## 2018-10-16 NOTE — Progress Notes (Signed)
NEUROLOGY FOLLOW UP OFFICE NOTE  [name] [id number]  HISTORY OF PRESENT ILLNESS: [name] is an 85 year old right-handed woman with paroxysmal atrial fibrillation, hypertension, CAD, polymyalgia rheumatica, depression, IBS and arthritis who follows up for Alzheimer's disease.  She is accompanied by her husband who supplements history.  UPDATE: She is currently taking Namzaric.   They have since moved out of [facility].  They have since moved into their own apartment.  It is much better for them.  Overall, she doesn't feel well.  She notes that her body is sore, particularly the left leg.  She also reports sore throat and hiccups.  Several times a week she has trouble with sleep. She will often wake up in the middle of the night but is able to fall back in 30 to 40 minutes.  Over the past couple of weeks, she endorses aching pain in the left leg, around the knee and lower thigh.  No back pain or numbness or weakness.  She had imaging of the knee but results still pending.  Once a week, she may take a "sleeping pill".  She takes melatonin 3mg  a night.  Memory continues to decline.     HISTORY: She is a retired Education officer, museum.She began noticing gradual memory deficits over the past 4 years.At first, she would forget appointments.She would need to use a calendar to remember to keep phone appointments with friends that she had for 6-8 years.She tended to look at the calendar 3 or 4 times a day to make sure she doesn't miss an appointment.She began misplacing objects around the house.She began forgetting recipes that she used for many years.She has lived in [place] for over 31 years.Sometimes when somebody talks to her about a specific location, she has to stop and try to remember where it is.On rare occasions, she has gotten disoriented while driving familiar routes.She cannot remember names of certain streets.She forgets names of acquaintances and it would  take a while before she remembers.She enjoys reading, but often she will have to re-read chapters because she forgot the content.She belongs to a book club and has become less vocal because she cannot remember the story as much anymore.When she is in the kitchen, she sometimes forgets where she keeps the pots or pans.She has also begun forgetting to take her medication, so her husband has to remind her.Her husband manages the finances, but she does write checks and pay some bills and has not had any difficulty with that.When she wakes up in the morning, she is not sure of the day or what her plans are for that day.She was given a diagnosis of early Alzheimer's recently, and has been anxious and scared since that time.She easily cries but she says it is due to this news and not because of depression.She denies hallucinations or delusions.She usually sleeps well.Both her mother and sister had dementia.  She also notes shakiness in her hands.When she writes or holds a utensil, her hand shakes.  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  She has cerebrovascular disease with risk factors such as atrial fibrillation, hypertension, and hyperlipidemia.  PAST MEDICAL HISTORY: Past Medical History:  Diagnosis Date   Anxiety    Atrophic vaginitis    CAD (coronary artery disease) 2008/2010   Catheterization 2008, 40% LAD after  first diagonal  /  nuclear January, 2010 normal   Carotid artery disease ([place])    Doppler, July, 2011, 0-39% bilateral   Decreased platelet count ([place]) 04/2011   142,000   Endometrial polyp    GERD (gastroesophageal reflux disease)    Hyperlipidemia    Hypertension    Irritable bowel syndrome with constipation    Memory  difficulty 01-20-13   "memory issues"   Normal nuclear stress test Feb 2013   No ischemia. Normal wall motion. EF greater than 70%   Osteoarthritis    Osteopenia    Osteoporosis    Palpitations    Monitor, May, 2013, no atrial fib,  one short run of PSVT   Paroxysmal atrial fibrillation (HCC)    Coumadin therapy   Polymyalgia rheumatica ([place])    Spinal stenosis    Stress    Vitamin D deficiency     MEDICATIONS: Current Outpatient Medications on File Prior to Visit  Medication Sig Dispense Refill   ALENDRONATE SODIUM PO Take 70 mg by mouth once a week.     Cholecalciferol (VITAMIN D3) 1000 units CAPS Take 1,000 Units by mouth daily.     cyclobenzaprine (FLEXERIL) 10 MG tablet Take 1 tablet (10 mg total) by mouth 3 (three) times daily as needed for muscle spasms. 20 tablet 0   ELIQUIS 5 MG TABS tablet Take 5 mg by mouth 2 (two) times daily.     famotidine (PEPCID) 20 MG tablet Take 1 tablet (20 mg total) by mouth 2 (two) times daily. 30 tablet 0   LUTEIN PO Take by mouth. Take 1 tablet by mouth every Tue, [place], Sat.     Melatonin 1 MG TABS Take 1 tablet by mouth at bedtime as needed.      NAMZARIC 28-10 MG CP24 TAKE ONE CAPSULE BY MOUTH EVERY MORNING 30 capsule 2   [DISCONTINUED] estradiol (ESTRACE) 1 MG tablet Take 1 mg by mouth as needed.      No current facility-administered medications on file prior to visit.     ALLERGIES: No Known Allergies  FAMILY HISTORY: Family History  Problem Relation Age of Onset   Transient ischemic attack Mother    Stroke Mother    Cancer Father        PROSTATE   Pancreatitis Father    Stroke Sister    Diabetes Sister    Hypertension Sister    Heart disease Sister    Dementia Sister    Spina bifida Brother    Hypertension Brother    Heart disease Brother    SOCIAL HISTORY: Social History   Socioeconomic History   Marital status: Married    Spouse name: Jeneen Rinks   Number of children: 2   Years of  education: Masters   Highest education level: Not on file  Occupational History   Occupation: Retired    Fish farm manager: RETIRED  Scientist, product/process development strain: Not on file   Food insecurity    Worry: Not on file    Inability: Not on Lexicographer needs    Medical: Not on file    Non-medical: Not on file  Tobacco Use   Smoking status: Never Smoker   Smokeless tobacco: Never Used  Substance and Sexual Activity   Alcohol use: No   Drug use: No   Sexual activity: Never    Birth control/protection: Post-menopausal  Lifestyle   Physical activity    Days per week: Not on file    Minutes  per session: Not on file   Stress: Not on file  Relationships   Social connections    Talks on phone: Not on file    Gets together: Not on file    Attends religious service: Not on file    Active member of club or organization: Not on file    Attends meetings of clubs or organizations: Not on file    Relationship status: Not on file   Intimate partner violence    Fear of current or ex partner: Not on file    Emotionally abused: Not on file    Physically abused: Not on file    Forced sexual activity: Not on file  Other Topics Concern   Not on file  Social History Narrative   Patient is married Jeneen Rinks) and lives at home with her husband.   Patient has two children.   Retired; Married;   Patient is right-handed.   Patient drinks very little caffeine.   Patient has a Scientist, water quality.   Has had increased stress as her husband had a stroke ~ 3wks ago and she has been taking care of him. (04/07/11)    REVIEW OF SYSTEMS: Constitutional: No fevers, chills, or sweats, no generalized fatigue, change in appetite Eyes: No visual changes, double vision, eye pain Ear, nose and throat:sore throat Cardiovascular: No chest pain, palpitations Respiratory:  No shortness of breath at rest or with exertion, wheezes GastrointestinaI: No nausea, vomiting, diarrhea, abdominal pain,  fecal incontinence Genitourinary:  No dysuria, urinary retention or frequency Musculoskeletal:  Left leg pain.  No neck pain, back pain Integumentary: No rash, pruritus, skin lesions Neurological: as above Psychiatric: No depression, insomnia, anxiety Endocrine: No palpitations, fatigue, diaphoresis, mood swings, change in appetite, change in weight, increased thirst Hematologic/Lymphatic:  No purpura, petechiae. Allergic/Immunologic: no itchy/runny eyes, nasal congestion, recent allergic reactions, rashes  PHYSICAL EXAM: Blood pressure 113/72, pulse 88, temperature 98.6 F (37 C), height 5\' 2"  (1.575 m), weight 103 lb (46.7 kg), SpO2 98 %. General: No acute distress.  Patient appears well-groomed.   Head:  Normocephalic/atraumatic Eyes:  Fundi examined but not visualized Neck: supple, no paraspinal tenderness, full range of motion Heart:  Regular rate and rhythm Lungs:  Clear to auscultation bilaterally Back: No paraspinal tenderness Neurological Exam: alert and oriented.  Attention span and concentration fair,delayed recall 1/3 after 5 minutes, remote memory intact, fund of knowledge intact.  Speech fluent and not dysarthric, language intact.  CN II-XII intact. Bulk and tone normal, muscle strength 5/5 throughout.  Sensation to light touch, temperature and vibration intact.  Deep tendon reflexes 2+ throughout.  Finger to nose testing intact.  Antalgic gait.  Romberg negative.  IMPRESSION: Alzheimer's dementia.   Left knee pain.  Tender to palpation.  Does not seem to be radicular.  PLAN: Namzaric Follow up in 6 months.  25 minutes spent face to face with patient, over 50% spent discussing management.   Metta Clines, DO

## 2018-10-17 ENCOUNTER — Other Ambulatory Visit: Payer: Self-pay

## 2018-10-17 ENCOUNTER — Encounter: Payer: Self-pay | Admitting: Neurology

## 2018-10-17 ENCOUNTER — Ambulatory Visit (INDEPENDENT_AMBULATORY_CARE_PROVIDER_SITE_OTHER): Payer: Medicare Other | Admitting: Neurology

## 2018-10-17 VITALS — BP 113/72 | HR 88 | Temp 98.6°F | Ht 62.0 in | Wt 103.0 lb

## 2018-10-17 DIAGNOSIS — G301 Alzheimer's disease with late onset: Secondary | ICD-10-CM | POA: Diagnosis not present

## 2018-10-17 DIAGNOSIS — F0280 Dementia in other diseases classified elsewhere without behavioral disturbance: Secondary | ICD-10-CM

## 2018-10-17 NOTE — Patient Instructions (Signed)
Continue Namzaric Follow up in 6 months.

## 2018-11-07 ENCOUNTER — Other Ambulatory Visit: Payer: Self-pay | Admitting: Neurology

## 2018-11-07 DIAGNOSIS — G309 Alzheimer's disease, unspecified: Secondary | ICD-10-CM

## 2018-11-07 DIAGNOSIS — F0280 Dementia in other diseases classified elsewhere without behavioral disturbance: Secondary | ICD-10-CM

## 2018-11-11 ENCOUNTER — Ambulatory Visit (INDEPENDENT_AMBULATORY_CARE_PROVIDER_SITE_OTHER): Payer: Medicare Other | Admitting: Emergency Medicine

## 2018-11-11 ENCOUNTER — Other Ambulatory Visit: Payer: Self-pay

## 2018-11-11 ENCOUNTER — Encounter: Payer: Self-pay | Admitting: Emergency Medicine

## 2018-11-11 VITALS — BP 104/65 | HR 79 | Temp 98.0°F | Ht 62.0 in | Wt 106.2 lb

## 2018-11-11 DIAGNOSIS — J312 Chronic pharyngitis: Secondary | ICD-10-CM | POA: Diagnosis not present

## 2018-11-11 NOTE — Patient Instructions (Addendum)
If you have lab work done today you will be contacted with your lab results within the next 2 weeks.  If you have not heard from [place] then please contact us. The fastest way to get your results is to register for My Chart.   IF you received an x-ray today, you will receive an invoice from [facility]. Please contact [facility] at [phone] with questions or concerns regarding your invoice.   IF you received labwork today, you will receive an invoice from [place]. Please contact [facility] at [phone] with questions or concerns regarding your invoice.   Our billing staff will not be able to assist you with questions regarding bills from these companies.  You will be contacted with the lab results as soon as they are available. The fastest way to get your results is to activate your My Chart account. Instructions are located on the last page of this paperwork. If you have not heard from [place] regarding the results in 2 weeks, please contact this office.     Sore Throat When you have a sore throat, your throat may feel:  Tender.  Burning.  Irritated.  Scratchy.  Painful when you swallow.  Painful when you talk. Many things can cause a sore throat, such as:  An infection.  Allergies.  Dry air.  Smoke or pollution.  Radiation treatment.  Gastroesophageal reflux disease (GERD).  A tumor. A sore throat can be the first sign of another sickness. It can happen with other problems, like:  Coughing.  Sneezing.  Fever.  Swelling in the neck. Most sore throats go away without treatment. Follow these instructions at home:      Take over-the-counter medicines only as told by your doctor. ? If your child has a sore throat, do not give your child aspirin.  Drink enough fluids to keep your pee (urine) pale yellow.  Rest when you feel you need to.  To help with pain: ? Sip warm liquids, such as broth, herbal tea, or warm water. ? Eat or drink  cold or frozen liquids, such as frozen ice pops. ? Gargle with a salt-water mixture 3-4 times a day or as needed. To make a salt-water mixture, add -1 tsp (3-6 g) of salt to 1 cup (237 mL) of warm water. Mix it until you cannot see the salt anymore. ? Suck on hard candy or throat lozenges. ? Put a cool-mist humidifier in your bedroom at night. ? Sit in the bathroom with the door closed for 5-10 minutes while you run hot water in the shower.  Do not use any products that contain nicotine or tobacco, such as cigarettes, e-cigarettes, and chewing tobacco. If you need help quitting, ask your doctor.  Wash your hands well and often with soap and water. If soap and water are not available, use hand sanitizer. Contact a doctor if:  You have a fever for more than 2-3 days.  You keep having symptoms for more than 2-3 days.  Your throat does not get better in 7 days.  You have a fever and your symptoms suddenly get worse.  Your child who is 3 months to 68 years old has a temperature of 102.65F (39C) or higher. Get help right away if:  You have trouble breathing.  You cannot swallow fluids, soft foods, or your saliva.  You have swelling in your throat or neck that gets worse.  You keep feeling sick to your stomach (nauseous).  You keep throwing up (  vomiting). Summary  A sore throat is pain, burning, irritation, or scratchiness in the throat. Many things can cause a sore throat.  Take over-the-counter medicines only as told by your doctor. Do not give your child aspirin.  Drink plenty of fluids, and rest as needed.  Contact a doctor if your symptoms get worse or your sore throat does not get better within 7 days. This information is not intended to replace advice given to you by your health care provider. Make sure you discuss any questions you have with your health care provider. Document Released: 12/07/2007 Document Revised: 07/30/2017 Document Reviewed: 07/30/2017 Elsevier Patient  Education  2020 [employer].

## 2018-11-11 NOTE — Progress Notes (Signed)
[name] 85 y.o.   Chief Complaint  Patient presents with  . Sore Throat    per patient she has seen ENT and PCP for this problem, and patient was unable to get an appointment with Dr Pamella Pert today  . Dizziness    per patient started November 2020    HISTORY OF PRESENT ILLNESS: This is a 85 y.o. female complaining of chronic sore throat with no apparent cause.  Here with her husband today.  They are not satisfied with PCP or ENT doctor answers so far.  Negative work-up.  First visit with me.  Chronic medical problems.  Patient unknown to me.  HPI   Prior to Admission medications   Medication Sig Start Date End Date Taking? Authorizing Provider  acetaminophen (TYLENOL) 325 MG tablet Take 650 mg by mouth every 6 (six) hours as needed.   Yes [provider]  ALENDRONATE SODIUM PO Take 70 mg by mouth once a week.   Yes [provider]  Cholecalciferol (VITAMIN D3) 1000 units CAPS Take 1,000 Units by mouth daily.   Yes [provider]  Melatonin 1 MG TABS Take 1 tablet by mouth at bedtime as needed.    Yes [provider]  NAMZARIC 28-10 MG CP24 TAKE ONE CAPSULE BY MOUTH EVERY MORNING 07/31/18  Yes Kathrynn Ducking, MD  cyclobenzaprine (FLEXERIL) 10 MG tablet Take 1 tablet (10 mg total) by mouth 3 (three) times daily as needed for muscle spasms. Patient not taking: Reported on 11/11/2018 06/03/18   Veryl Speak, MD  ELIQUIS 5 MG TABS tablet Take 5 mg by mouth 2 (two) times daily. 12/27/17   [provider]  famotidine (PEPCID) 20 MG tablet Take 1 tablet (20 mg total) by mouth 2 (two) times daily. Patient not taking: Reported on 11/11/2018 07/08/18   Jacqlyn Larsen, PA-C  LUTEIN PO Take by mouth. Take 1 tablet by mouth every Tue, [place], Sat.    [provider]  estradiol (ESTRACE) 1 MG tablet Take 1 mg by mouth as needed.   03/16/11  [provider]    No Known Allergies  Patient Active Problem List   Diagnosis Date  Noted  . Late onset Alzheimer's disease without behavioral disturbance ([place]) 09/07/2016  . Memory loss or impairment 07/02/2013  . Palpitations   . Osteopenia   . Spinal stenosis   . Anxiety   . Warfarin anticoagulation   . GERD (gastroesophageal reflux disease)   . Osteoporosis   . Paroxysmal atrial fibrillation ([facility])   . Hypertension   . Hyperlipidemia   . Carotid artery disease ([place])   . CAD (coronary artery disease)     Past Medical History:  Diagnosis Date  . Anxiety   . Atrophic vaginitis   . CAD (coronary artery disease) 2008/2010   Catheterization 2008, 40% LAD after first diagonal  /  nuclear January, 2010 normal  . Carotid artery disease ([place])    Doppler, July, 2011, 0-39% bilateral  . Decreased platelet count ([place]) 04/2011   142,000  . Endometrial polyp   . GERD (gastroesophageal reflux disease)   . Hyperlipidemia   . Hypertension   . Irritable bowel syndrome with constipation   . Memory difficulty 01-20-13   "memory issues"  . Normal nuclear stress test Feb 2013   No ischemia. Normal wall motion. EF greater than 70%  . Osteoarthritis   . Osteopenia   . Osteoporosis   . Palpitations    Monitor, May, 2013, no atrial  fib,  one short run of PSVT  . Paroxysmal atrial fibrillation ([facility])    Coumadin therapy  . Polymyalgia rheumatica ([place])   . Spinal stenosis   . Stress   . Vitamin D deficiency     Past Surgical History:  Procedure Laterality Date  . BREAST SURGERY     BENIGN BREAST LUMP  . CATARACT SURGERY  2009  . COLONOSCOPY WITH PROPOFOL N/A 02/11/2013   Procedure: COLONOSCOPY WITH PROPOFOL;  Surgeon: [name], MD;  Location: [facility];  Service: Endoscopy;  Laterality: N/A;  . DILATION AND CURETTAGE OF UTERUS    . EXCISION MORTON'S NEUROMA  2000  . HIP SURGERY Right 2005   debridement  . HYSTEROSCOPY  02/07/2005   HYST W D&C, DX: PMB W SMALL ENDO POLYP-SURGEON [name]  . LAMINECTOMY  March 2009   L4 through L5  . TONSILLECTOMY       Social History   Socioeconomic History  . Marital status: Married    Spouse name: Jeneen Rinks  . Number of children: 2  . Years of education: Masters  . Highest education level: Not on file  Occupational History  . Occupation: Retired    Fish farm manager: RETIRED  Social Needs  . Financial resource strain: Not on file  . Food insecurity    Worry: Not on file    Inability: Not on file  . Transportation needs    Medical: Not on file    Non-medical: Not on file  Tobacco Use  . Smoking status: Never Smoker  . Smokeless tobacco: Never Used  Substance and Sexual Activity  . Alcohol use: No  . Drug use: No  . Sexual activity: Never    Birth control/protection: Post-menopausal  Lifestyle  . Physical activity    Days per week: Not on file    Minutes per session: Not on file  . Stress: Not on file  Relationships  . Social Herbalist on phone: Not on file    Gets together: Not on file    Attends religious service: Not on file    Active member of club or organization: Not on file    Attends meetings of clubs or organizations: Not on file    Relationship status: Not on file  . Intimate partner violence    Fear of current or ex partner: Not on file    Emotionally abused: Not on file    Physically abused: Not on file    Forced sexual activity: Not on file  Other Topics Concern  . Not on file  Social History Narrative   Patient is married Jeneen Rinks) and lives at home with her husband.   Patient has two children.   Retired; Married;   Patient is right-handed.   Patient drinks very little caffeine.   Patient has a Scientist, water quality.   Has had increased stress as her husband had a stroke ~ 3wks ago and she has been taking care of him. (04/07/11)    Family History  Problem Relation Age of Onset  . Transient ischemic attack Mother   . Stroke Mother   . Cancer Father        PROSTATE  . Pancreatitis Father   . Stroke Sister   . Diabetes Sister   . Hypertension Sister   . Heart  disease Sister   . Dementia Sister   . Spina bifida Brother   . Hypertension Brother   . Heart disease Brother      Review of Systems  Constitutional: Negative.  Negative for chills and fever.  HENT: Positive for sore throat (Chronic). Negative for congestion and ear pain.   Eyes: Negative.   Respiratory: Positive for cough (Chronic). Negative for shortness of breath.   Cardiovascular: Negative.  Negative for chest pain and palpitations.  Gastrointestinal: Negative for abdominal pain, diarrhea, nausea and vomiting.  Musculoskeletal: Negative.   Skin: Negative.  Negative for rash.  Neurological: Positive for dizziness (Chronic).  All other systems reviewed and are negative.   Vitals:   11/11/18 0952  BP: 104/65  Pulse: 79  Temp: 98 F (36.7 C)    Physical Exam Vitals signs reviewed.  Constitutional:      Appearance: She is well-developed.  HENT:     Head: Normocephalic.     Nose: Nose normal.     Mouth/Throat:     Mouth: Mucous membranes are moist.     Pharynx: Oropharynx is clear.  Eyes:     Extraocular Movements: Extraocular movements intact.     Conjunctiva/sclera: Conjunctivae normal.     Pupils: Pupils are equal, round, and reactive to light.  Neck:     Musculoskeletal: Normal range of motion and neck supple.  Cardiovascular:     Rate and Rhythm: Normal rate. Rhythm irregular.     Heart sounds: Normal heart sounds.  Pulmonary:     Effort: Pulmonary effort is normal.     Breath sounds: Normal breath sounds.  Musculoskeletal: Normal range of motion.  Skin:    General: Skin is warm and dry.     Capillary Refill: Capillary refill takes less than 2 seconds.  Neurological:     General: No focal deficit present.     Mental Status: She is alert and oriented to person, place, and time.      ASSESSMENT & PLAN: Khisha was seen today for sore throat and dizziness.  Diagnoses and all orders for this visit:  Chronic sore throat -     Ambulatory referral to ENT     Patient Instructions       If you have lab work done today you will be contacted with your lab results within the next 2 weeks.  If you have not heard from [place] then please contact us. The fastest way to get your results is to register for My Chart.   IF you received an x-ray today, you will receive an invoice from [facility]. Please contact [facility] at [phone] with questions or concerns regarding your invoice.   IF you received labwork today, you will receive an invoice from [facility]. Please contact [facility] at [phone] with questions or concerns regarding your invoice.   Our billing staff will not be able to assist you with questions regarding bills from these companies.  You will be contacted with the lab results as soon as they are available. The fastest way to get your results is to activate your My Chart account. Instructions are located on the last page of this paperwork. If you have not heard from [place] regarding the results in 2 weeks, please contact this office.     Sore Throat When you have a sore throat, your throat may feel:  Tender.  Burning.  Irritated.  Scratchy.  Painful when you swallow.  Painful when you talk. Many things can cause a sore throat, such as:  An infection.  Allergies.  Dry air.  Smoke or pollution.  Radiation treatment.  Gastroesophageal reflux disease (GERD).  A tumor. A sore throat can be the first  sign of another sickness. It can happen with other problems, like:  Coughing.  Sneezing.  Fever.  Swelling in the neck. Most sore throats go away without treatment. Follow these instructions at home:      Take over-the-counter medicines only as told by your doctor. ? If your child has a sore throat, do not give your child aspirin.  Drink enough fluids to keep your pee (urine) pale yellow.  Rest when you feel you need to.  To help with pain: ? Sip warm liquids, such as broth, herbal  tea, or warm water. ? Eat or drink cold or frozen liquids, such as frozen ice pops. ? Gargle with a salt-water mixture 3-4 times a day or as needed. To make a salt-water mixture, add -1 tsp (3-6 g) of salt to 1 cup (237 mL) of warm water. Mix it until you cannot see the salt anymore. ? Suck on hard candy or throat lozenges. ? Put a cool-mist humidifier in your bedroom at night. ? Sit in the bathroom with the door closed for 5-10 minutes while you run hot water in the shower.  Do not use any products that contain nicotine or tobacco, such as cigarettes, e-cigarettes, and chewing tobacco. If you need help quitting, ask your doctor.  Wash your hands well and often with soap and water. If soap and water are not available, use hand sanitizer. Contact a doctor if:  You have a fever for more than 2-3 days.  You keep having symptoms for more than 2-3 days.  Your throat does not get better in 7 days.  You have a fever and your symptoms suddenly get worse.  Your child who is 3 months to 7 years old has a temperature of 102.45F (39C) or higher. Get help right away if:  You have trouble breathing.  You cannot swallow fluids, soft foods, or your saliva.  You have swelling in your throat or neck that gets worse.  You keep feeling sick to your stomach (nauseous).  You keep throwing up (vomiting). Summary  A sore throat is pain, burning, irritation, or scratchiness in the throat. Many things can cause a sore throat.  Take over-the-counter medicines only as told by your doctor. Do not give your child aspirin.  Drink plenty of fluids, and rest as needed.  Contact a doctor if your symptoms get worse or your sore throat does not get better within 7 days. This information is not intended to replace advice given to you by your health care provider. Make sure you discuss any questions you have with your health care provider. Document Released: 12/07/2007 Document Revised: 07/30/2017 Document  Reviewed: 07/30/2017 Elsevier Patient Education  2020 Elsevier Inc.      Agustina Caroli, MD [facility]

## 2018-11-12 ENCOUNTER — Telehealth: Payer: Self-pay | Admitting: Neurology

## 2018-11-12 DIAGNOSIS — F0280 Dementia in other diseases classified elsewhere without behavioral disturbance: Secondary | ICD-10-CM

## 2018-11-12 MED ORDER — NAMZARIC 28-10 MG PO CP24: 1.0000 | ORAL_CAPSULE | Freq: Every morning | ORAL | 1 refills | Status: DC

## 2018-11-12 NOTE — Telephone Encounter (Signed)
Patient is needing a refill on the Namzaric medication- [name] at [facility]. Not sure about supply amount she usually gets hoping for 90 day if possible. Thanks!

## 2018-11-12 NOTE — Telephone Encounter (Signed)
Requested Prescriptions   Pending Prescriptions Disp Refills  . Memantine HCl-Donepezil HCl (NAMZARIC) 28-10 MG CP24 30 capsule 2    Sig: Take 1 capsule by mouth every morning.   Rx last filled:07/31/18 #30 2 refills by Margette Fast, md   Pt last seen: 10/17/18   Follow up appt scheduled:04/22/2019

## 2018-11-19 ENCOUNTER — Ambulatory Visit: Payer: Medicare Other | Admitting: Neurology

## 2018-11-26 ENCOUNTER — Other Ambulatory Visit: Payer: Self-pay

## 2018-11-26 ENCOUNTER — Encounter: Payer: Self-pay | Admitting: Sports Medicine

## 2018-11-26 ENCOUNTER — Ambulatory Visit (INDEPENDENT_AMBULATORY_CARE_PROVIDER_SITE_OTHER): Payer: Medicare Other | Admitting: Sports Medicine

## 2018-11-26 VITALS — BP 102/64 | Ht 62.0 in | Wt 105.0 lb

## 2018-11-26 DIAGNOSIS — M25562 Pain in left knee: Secondary | ICD-10-CM | POA: Diagnosis not present

## 2018-11-26 NOTE — Progress Notes (Signed)
PCP: Lajean Manes, MD  Subjective:   HPI: Patient is a 85 y.o. female here for follow-up of left knee pain.  Patient was last seen on August 4.  At that time she was found to have a lateral meniscus tear seen on ultrasound.  She also had a small to moderate joint effusion.  Patient was given a compression sleeve as well as home exercises to work on.  Patient's husband notes she has not been using the compression sleeve nor she been doing her exercises frequently as she has Alzheimer's and frequently forgets to do these.  Patient's pain is still intermittent.  Is located on the medial joint line.  It does not radiate.  She has no associated numbness or tingling.  She does get intermittent swelling but no bruising.  Patient denies any trauma this marked her pain.  Tylenol does seem to help make her pain better.  She currently walks approximately 200 yards per day around the neighborhood with her husband.   Review of Systems: See HPI above.  Past Medical History:  Diagnosis Date  . Anxiety   . Atrophic vaginitis   . CAD (coronary artery disease) 2008/2010   Catheterization 2008, 40% LAD after first diagonal  /  nuclear January, 2010 normal  . Carotid artery disease ([place])    Doppler, July, 2011, 0-39% bilateral  . Decreased platelet count ([place]) 04/2011   142,000  . Endometrial polyp   . GERD (gastroesophageal reflux disease)   . Hyperlipidemia   . Hypertension   . Irritable bowel syndrome with constipation   . Memory difficulty 01-20-13   "memory issues"  . Normal nuclear stress test Feb 2013   No ischemia. Normal wall motion. EF greater than 70%  . Osteoarthritis   . Osteopenia   . Osteoporosis   . Palpitations    Monitor, May, 2013, no atrial fib,  one short run of PSVT  . Paroxysmal atrial fibrillation (HCC)    Coumadin therapy  . Polymyalgia rheumatica ([place])   . Spinal stenosis   . Stress   . Vitamin D deficiency     Current Outpatient Medications on File Prior to Visit   Medication Sig Dispense Refill  . Cholecalciferol (VITAMIN D3) 1000 units CAPS Take 1,000 Units by mouth daily.    [name] ELIQUIS 5 MG TABS tablet Take 2.5 mg by mouth 2 (two) times daily.     . LUTEIN PO Take by mouth. Take 1 tablet by mouth every Tue, [place], Sat.    . Melatonin 1 MG TABS Take 1 tablet by mouth at bedtime as needed.     . Memantine HCl-Donepezil HCl (NAMZARIC) 28-10 MG CP24 Take 1 capsule by mouth every morning. 90 capsule 1  . traMADol (ULTRAM) 50 MG tablet Take by mouth every 8 (eight) hours as needed.    [name] acetaminophen (TYLENOL) 325 MG tablet Take 650 mg by mouth every 6 (six) hours as needed.    . [DISCONTINUED] estradiol (ESTRACE) 1 MG tablet Take 1 mg by mouth as needed.      No current facility-administered medications on file prior to visit.     Past Surgical History:  Procedure Laterality Date  . BREAST SURGERY     BENIGN BREAST LUMP  . CATARACT SURGERY  2009  . COLONOSCOPY WITH PROPOFOL N/A 02/11/2013   Procedure: COLONOSCOPY WITH PROPOFOL;  Surgeon: [name], MD;  Location: [facility];  Service: Endoscopy;  Laterality: N/A;  . DILATION AND CURETTAGE OF UTERUS    .  EXCISION MORTON'S NEUROMA  2000  . HIP SURGERY Right 2005   debridement  . HYSTEROSCOPY  02/07/2005   HYST W D&C, DX: PMB W SMALL ENDO POLYP-SURGEON [name]  . LAMINECTOMY  March 2009   L4 through L5  . TONSILLECTOMY      No Known Allergies  Social History   Socioeconomic History  . Marital status: Married    Spouse name: Jeneen Rinks  . Number of children: 2  . Years of education: Masters  . Highest education level: Not on file  Occupational History  . Occupation: Retired    Fish farm manager: RETIRED  Social Needs  . Financial resource strain: Not on file  . Food insecurity    Worry: Not on file    Inability: Not on file  . Transportation needs    Medical: Not on file    Non-medical: Not on file  Tobacco Use  . Smoking status: Never Smoker  . Smokeless tobacco: Never Used   Substance and Sexual Activity  . Alcohol use: No  . Drug use: No  . Sexual activity: Never    Birth control/protection: Post-menopausal  Lifestyle  . Physical activity    Days per week: Not on file    Minutes per session: Not on file  . Stress: Not on file  Relationships  . Social Herbalist on phone: Not on file    Gets together: Not on file    Attends religious service: Not on file    Active member of club or organization: Not on file    Attends meetings of clubs or organizations: Not on file    Relationship status: Not on file  . Intimate partner violence    Fear of current or ex partner: Not on file    Emotionally abused: Not on file    Physically abused: Not on file    Forced sexual activity: Not on file  Other Topics Concern  . Not on file  Social History Narrative   Patient is married Jeneen Rinks) and lives at home with her husband.   Patient has two children.   Retired; Married;   Patient is right-handed.   Patient drinks very little caffeine.   Patient has a Scientist, water quality.   Has had increased stress as her husband had a stroke ~ 3wks ago and she has been taking care of him. (04/07/11)    Family History  Problem Relation Age of Onset  . Transient ischemic attack Mother   . Stroke Mother   . Cancer Father        PROSTATE  . Pancreatitis Father   . Stroke Sister   . Diabetes Sister   . Hypertension Sister   . Heart disease Sister   . Dementia Sister   . Spina bifida Brother   . Hypertension Brother   . Heart disease Brother         Objective:  Physical Exam: BP 102/64   Ht 5\' 2"  (1.575 m)   Wt 105 lb (47.6 kg)   BMI 19.20 kg/m  Gen: NAD, comfortable in exam room Lungs: Breathing comfortably on room air Knee Exam Left -Inspection: Small amount of swelling on the right knee. -Palpation: Knee is slightly warm to touch.  There is medial joint line tenderness -ROM: Extension: 0 degrees; Flexion: 130 degrees -Strength: Extension: 5/5; Flexion:  5/5 -Special Tests: Varus Stress: Negative; Valgus Stress: Negative; Lachman: Negative; Posterior drawer: Negative; McMurray: Positive -Limb neurovascularly intact  Contralateral Knee -Inspection: no deformity, no discoloration -  ROM: Extension: 0 degrees; Flexion: 150 degrees -Limb neurovascularly intact    Assessment & Plan:  Patient is a 85 y.o. female here for follow-up of left knee pain  1.  Left knee pain -Patient with known lateral meniscus tear as well as underlying osteoarthritis. - Patient was offered a knee injection, however this was declined by her and her husband as her pain is only mild and intermittent -Patient's husband to remind her to use the knee compression sleeve as well as to work on home exercises -Patient to take Tylenol as needed for pain  Patient to follow-up as needed  I observed and examined the patient with Dr. Sheppard Coil and agree with assessment and plan.  Note reviewed and modified by me. Ila Mcgill, MD

## 2018-12-02 DIAGNOSIS — J029 Acute pharyngitis, unspecified: Secondary | ICD-10-CM | POA: Diagnosis not present

## 2018-12-02 DIAGNOSIS — R42 Dizziness and giddiness: Secondary | ICD-10-CM | POA: Diagnosis not present

## 2018-12-06 DIAGNOSIS — I10 Essential (primary) hypertension: Secondary | ICD-10-CM | POA: Diagnosis not present

## 2018-12-06 DIAGNOSIS — G301 Alzheimer's disease with late onset: Secondary | ICD-10-CM | POA: Diagnosis not present

## 2018-12-06 DIAGNOSIS — D6869 Other thrombophilia: Secondary | ICD-10-CM | POA: Diagnosis not present

## 2018-12-06 DIAGNOSIS — J029 Acute pharyngitis, unspecified: Secondary | ICD-10-CM | POA: Diagnosis not present

## 2018-12-06 DIAGNOSIS — I482 Chronic atrial fibrillation, unspecified: Secondary | ICD-10-CM | POA: Diagnosis not present

## 2018-12-06 DIAGNOSIS — Z79899 Other long term (current) drug therapy: Secondary | ICD-10-CM | POA: Diagnosis not present

## 2018-12-17 DIAGNOSIS — L439 Lichen planus, unspecified: Secondary | ICD-10-CM | POA: Diagnosis not present

## 2018-12-17 DIAGNOSIS — R070 Pain in throat: Secondary | ICD-10-CM | POA: Diagnosis not present

## 2019-01-02 DIAGNOSIS — G301 Alzheimer's disease with late onset: Secondary | ICD-10-CM | POA: Diagnosis not present

## 2019-01-02 DIAGNOSIS — M810 Age-related osteoporosis without current pathological fracture: Secondary | ICD-10-CM | POA: Diagnosis not present

## 2019-01-02 DIAGNOSIS — E7800 Pure hypercholesterolemia, unspecified: Secondary | ICD-10-CM | POA: Diagnosis not present

## 2019-01-02 DIAGNOSIS — I10 Essential (primary) hypertension: Secondary | ICD-10-CM | POA: Diagnosis not present

## 2019-01-02 DIAGNOSIS — I482 Chronic atrial fibrillation, unspecified: Secondary | ICD-10-CM | POA: Diagnosis not present

## 2019-01-07 DIAGNOSIS — K219 Gastro-esophageal reflux disease without esophagitis: Secondary | ICD-10-CM | POA: Diagnosis not present

## 2019-01-07 DIAGNOSIS — M25562 Pain in left knee: Secondary | ICD-10-CM | POA: Diagnosis not present

## 2019-01-07 DIAGNOSIS — M79602 Pain in left arm: Secondary | ICD-10-CM | POA: Diagnosis not present

## 2019-01-07 DIAGNOSIS — I10 Essential (primary) hypertension: Secondary | ICD-10-CM | POA: Diagnosis not present

## 2019-01-07 DIAGNOSIS — M79601 Pain in right arm: Secondary | ICD-10-CM | POA: Diagnosis not present

## 2019-01-07 DIAGNOSIS — R066 Hiccough: Secondary | ICD-10-CM | POA: Diagnosis not present

## 2019-01-07 DIAGNOSIS — Z23 Encounter for immunization: Secondary | ICD-10-CM | POA: Diagnosis not present

## 2019-01-08 ENCOUNTER — Ambulatory Visit (INDEPENDENT_AMBULATORY_CARE_PROVIDER_SITE_OTHER): Payer: Medicare Other | Admitting: Family Medicine

## 2019-01-08 ENCOUNTER — Encounter: Payer: Self-pay | Admitting: Family Medicine

## 2019-01-08 ENCOUNTER — Other Ambulatory Visit: Payer: Self-pay

## 2019-01-08 VITALS — BP 110/62 | Ht 62.0 in | Wt 108.0 lb

## 2019-01-08 DIAGNOSIS — M25562 Pain in left knee: Secondary | ICD-10-CM | POA: Diagnosis not present

## 2019-01-08 MED ORDER — METHYLPREDNISOLONE ACETATE 40 MG/ML IJ SUSP
40.0000 mg | Freq: Once | INTRAMUSCULAR | Status: AC
  Administered 2019-01-08: 40 mg via INTRA_ARTICULAR

## 2019-01-08 NOTE — Progress Notes (Signed)
PCP: Lajean Manes, MD  Subjective:   HPI: Patient is a 85 y.o. female here for left knee pain.  9/15: Patient is a 85 y.o. female here for follow-up of left knee pain.  Patient was last seen on August 4.  At that time she was found to have a lateral meniscus tear seen on ultrasound.  She also had a small to moderate joint effusion.  Patient was given a compression sleeve as well as home exercises to work on.  Patient's husband notes she has not been using the compression sleeve nor she been doing her exercises frequently as she has Alzheimer's and frequently forgets to do these.  Patient's pain is still intermittent.  Is located on the medial joint line.  It does not radiate.  She has no associated numbness or tingling.  She does get intermittent swelling but no bruising.  Patient denies any trauma this marked her pain.  Tylenol does seem to help make her pain better.  She currently walks approximately 200 yards per day around the neighborhood with her husband.  10/28: Patient here with husband.   They report pain has gotten worse since last visit. Pain anterior left knee though now feels she's getting some pain into left hip/low back. Difficulty walking due to pain but no pain at rest. Pain up to 3/10 today and sharp. Tylenol helps some. No skin changes, swelling.  Past Medical History:  Diagnosis Date  . Anxiety   . Atrophic vaginitis   . CAD (coronary artery disease) 2008/2010   Catheterization 2008, 40% LAD after first diagonal  /  nuclear January, 2010 normal  . Carotid artery disease ([place])    Doppler, July, 2011, 0-39% bilateral  . Decreased platelet count ([place]) 04/2011   142,000  . Endometrial polyp   . GERD (gastroesophageal reflux disease)   . Hyperlipidemia   . Hypertension   . Irritable bowel syndrome with constipation   . Memory difficulty 01-20-13   "memory issues"  . Normal nuclear stress test Feb 2013   No ischemia. Normal wall motion. EF greater than 70%  .  Osteoarthritis   . Osteopenia   . Osteoporosis   . Palpitations    Monitor, May, 2013, no atrial fib,  one short run of PSVT  . Paroxysmal atrial fibrillation (HCC)    Coumadin therapy  . Polymyalgia rheumatica ([place])   . Spinal stenosis   . Stress   . Vitamin D deficiency     Current Outpatient Medications on File Prior to Visit  Medication Sig Dispense Refill  . acetaminophen (TYLENOL) 325 MG tablet Take 650 mg by mouth every 6 (six) hours as needed.    . Cholecalciferol (VITAMIN D3) 1000 units CAPS Take 1,000 Units by mouth daily.    [name] ELIQUIS 5 MG TABS tablet Take 2.5 mg by mouth 2 (two) times daily.     . LUTEIN PO Take by mouth. Take 1 tablet by mouth every Tue, [place], Sat.    . Melatonin 1 MG TABS Take 1 tablet by mouth at bedtime as needed.     . Memantine HCl-Donepezil HCl (NAMZARIC) 28-10 MG CP24 Take 1 capsule by mouth every morning. 90 capsule 1  . traMADol (ULTRAM) 50 MG tablet Take by mouth every 8 (eight) hours as needed.    . [DISCONTINUED] estradiol (ESTRACE) 1 MG tablet Take 1 mg by mouth as needed.      No current facility-administered medications on file prior to visit.     Past Surgical  History:  Procedure Laterality Date  . BREAST SURGERY     BENIGN BREAST LUMP  . CATARACT SURGERY  2009  . COLONOSCOPY WITH PROPOFOL N/A 02/11/2013   Procedure: COLONOSCOPY WITH PROPOFOL;  Surgeon: [name], MD;  Location: [facility];  Service: Endoscopy;  Laterality: N/A;  . DILATION AND CURETTAGE OF UTERUS    . EXCISION MORTON'S NEUROMA  2000  . HIP SURGERY Right 2005   debridement  . HYSTEROSCOPY  02/07/2005   HYST W D&C, DX: PMB W SMALL ENDO POLYP-SURGEON [name]  . LAMINECTOMY  March 2009   L4 through L5  . TONSILLECTOMY      No Known Allergies  Social History   Socioeconomic History  . Marital status: Married    Spouse name: Jeneen Rinks  . Number of children: 2  . Years of education: Masters  . Highest education level: Not on file  Occupational  History  . Occupation: Retired    Fish farm manager: RETIRED  Social Needs  . Financial resource strain: Not on file  . Food insecurity    Worry: Not on file    Inability: Not on file  . Transportation needs    Medical: Not on file    Non-medical: Not on file  Tobacco Use  . Smoking status: Never Smoker  . Smokeless tobacco: Never Used  Substance and Sexual Activity  . Alcohol use: No  . Drug use: No  . Sexual activity: Never    Birth control/protection: Post-menopausal  Lifestyle  . Physical activity    Days per week: Not on file    Minutes per session: Not on file  . Stress: Not on file  Relationships  . Social Herbalist on phone: Not on file    Gets together: Not on file    Attends religious service: Not on file    Active member of club or organization: Not on file    Attends meetings of clubs or organizations: Not on file    Relationship status: Not on file  . Intimate partner violence    Fear of current or ex partner: Not on file    Emotionally abused: Not on file    Physically abused: Not on file    Forced sexual activity: Not on file  Other Topics Concern  . Not on file  Social History Narrative   Patient is married Jeneen Rinks) and lives at home with her husband.   Patient has two children.   Retired; Married;   Patient is right-handed.   Patient drinks very little caffeine.   Patient has a Scientist, water quality.   Has had increased stress as her husband had a stroke ~ 3wks ago and she has been taking care of him. (04/07/11)    Family History  Problem Relation Age of Onset  . Transient ischemic attack Mother   . Stroke Mother   . Cancer Father        PROSTATE  . Pancreatitis Father   . Stroke Sister   . Diabetes Sister   . Hypertension Sister   . Heart disease Sister   . Dementia Sister   . Spina bifida Brother   . Hypertension Brother   . Heart disease Brother     BP 110/62   Ht 5\' 2"  (1.575 m)   Wt 108 lb (49 kg)   BMI 19.75 kg/m   Review of  Systems: See HPI above.     Objective:  Physical Exam:  Gen: NAD, comfortable in exam room  Left knee: No gross deformity, ecchymoses, swelling. TTP medial and lateral joint lines. FROM with 5/5 strength flexion and extension. Negative ant/post drawers. Negative valgus/varus testing. Negative lachmans. Negative mcmurrays, apleys, patellar apprehension. NV intact distally.  Right knee: No deformity, instability. FROM with 5/5 strength. No tenderness to palpation. NVI distally.  Left hip: No deformity. FROM with 5/5 strength. No tenderness to palpation. NVI distally. Negative logroll.   Assessment & Plan:  1. Left knee pain - 2/2 arthritis and lateral meniscus tear.  Discussed tylenol, topical medications, supplements that may help.  Injection given today given level of pain.  We also discussed home health PT as an option.  Heat or ice.  F/u in 6 weeks but let us know how she's doing in a week.  After informed written consent timeout was performed, patient was seated on exam table. Left knee was prepped with alcohol swab and utilizing anteromedial approach, patient's left knee was injected intraarticularly with 3:1 bupivicaine: depomedrol. Patient tolerated the procedure well without immediate complications.

## 2019-01-08 NOTE — Patient Instructions (Signed)
Your pain is due to arthritis and a lateral meniscus tear. These are the different medications you can take for this: Tylenol 500mg  1-2 tabs three times a day for pain. Aspercreme, or biofreeze topically up to four times a day may also help with pain. Some supplements that may help for arthritis: Boswellia extract, curcumin, pycnogenol Cortisone injections are an option - you were given this today. It's important that you continue to stay active. Straight leg raises, knee extensions 3 sets of 10 once a day (add ankle weight if these become too easy). Consider physical therapy to strengthen muscles around the joint that hurts to take pressure off of the joint itself - we can consider having someone come out to your house for this if the shot doesn't provide enough relief. Heat or ice 15 minutes at a time 3-4 times a day as needed to help with pain. Follow up with me in 6 weeks but call and let me know how you're doing in a week.

## 2019-01-23 ENCOUNTER — Other Ambulatory Visit: Payer: Self-pay

## 2019-01-23 ENCOUNTER — Ambulatory Visit (INDEPENDENT_AMBULATORY_CARE_PROVIDER_SITE_OTHER): Payer: Medicare Other | Admitting: Otolaryngology

## 2019-01-23 ENCOUNTER — Encounter (INDEPENDENT_AMBULATORY_CARE_PROVIDER_SITE_OTHER): Payer: Self-pay | Admitting: Otolaryngology

## 2019-01-23 VITALS — Temp 97.3°F

## 2019-01-23 DIAGNOSIS — K2100 Gastro-esophageal reflux disease with esophagitis, without bleeding: Secondary | ICD-10-CM

## 2019-01-23 NOTE — Progress Notes (Signed)
HPI: [name] is a 85 y.o. female who returns today for evaluation of complaints of chronic recurring sore throat.  I have seen her previously 3 to 4 years ago with similar complaints.  She has recently seen Dr. Redmond Baseman with the complaint.  She describes a sore throat that comes and goes and points to the region of her larynx and cricoid in the midline.  She feels like it is worse after she eats.  She has no difficulty eating or swallowing.  Most of the discomfort occurs when she just swallows her saliva.  She has had no hoarseness. She does feel like her ears are little bit full. She does not smoke. She is on Eliquis and uses Tylenol as needed pain or discomfort.  Mostly for the sore throat.  Past Medical History:  Diagnosis Date  . Anxiety   . Atrophic vaginitis   . CAD (coronary artery disease) 2008/2010   Catheterization 2008, 40% LAD after first diagonal  /  nuclear January, 2010 normal  . Carotid artery disease ([place])    Doppler, July, 2011, 0-39% bilateral  . Decreased platelet count ([place]) 04/2011   142,000  . Endometrial polyp   . GERD (gastroesophageal reflux disease)   . Hyperlipidemia   . Hypertension   . Irritable bowel syndrome with constipation   . Memory difficulty 01-20-13   "memory issues"  . Normal nuclear stress test Feb 2013   No ischemia. Normal wall motion. EF greater than 70%  . Osteoarthritis   . Osteopenia   . Osteoporosis   . Palpitations    Monitor, May, 2013, no atrial fib,  one short run of PSVT  . Paroxysmal atrial fibrillation ([facility])    Coumadin therapy  . Polymyalgia rheumatica ([place])   . Spinal stenosis   . Stress   . Vitamin D deficiency    Past Surgical History:  Procedure Laterality Date  . BREAST SURGERY     BENIGN BREAST LUMP  . CATARACT SURGERY  2009  . COLONOSCOPY WITH PROPOFOL N/A 02/11/2013   Procedure: COLONOSCOPY WITH PROPOFOL;  Surgeon: [name], MD;  Location: [facility];  Service: Endoscopy;  Laterality: N/A;  .  DILATION AND CURETTAGE OF UTERUS    . EXCISION MORTON'S NEUROMA  2000  . HIP SURGERY Right 2005   debridement  . HYSTEROSCOPY  02/07/2005   HYST W D&C, DX: PMB W SMALL ENDO POLYP-SURGEON [name]  . LAMINECTOMY  March 2009   L4 through L5  . TONSILLECTOMY     Social History   Socioeconomic History  . Marital status: Married    Spouse name: Jeneen Rinks  . Number of children: 2  . Years of education: Masters  . Highest education level: Not on file  Occupational History  . Occupation: Retired    Fish farm manager: RETIRED  Social Needs  . Financial resource strain: Not on file  . Food insecurity    Worry: Not on file    Inability: Not on file  . Transportation needs    Medical: Not on file    Non-medical: Not on file  Tobacco Use  . Smoking status: Never Smoker  . Smokeless tobacco: Never Used  Substance and Sexual Activity  . Alcohol use: No  . Drug use: No  . Sexual activity: Never    Birth control/protection: Post-menopausal  Lifestyle  . Physical activity    Days per week: Not on file    Minutes per session: Not on file  . Stress: Not on file  Relationships  . Social Herbalist on phone: Not on file    Gets together: Not on file    Attends religious service: Not on file    Active member of club or organization: Not on file    Attends meetings of clubs or organizations: Not on file    Relationship status: Not on file  Other Topics Concern  . Not on file  Social History Narrative   Patient is married Jeneen Rinks) and lives at home with her husband.   Patient has two children.   Retired; Married;   Patient is right-handed.   Patient drinks very little caffeine.   Patient has a Scientist, water quality.   Has had increased stress as her husband had a stroke ~ 3wks ago and she has been taking care of him. (04/07/11)   Family History  Problem Relation Age of Onset  . Transient ischemic attack Mother   . Stroke Mother   . Cancer Father        PROSTATE  . Pancreatitis Father    . Stroke Sister   . Diabetes Sister   . Hypertension Sister   . Heart disease Sister   . Dementia Sister   . Spina bifida Brother   . Hypertension Brother   . Heart disease Brother    No Known Allergies Prior to Admission medications   Medication Sig Start Date End Date Taking? Authorizing Provider  acetaminophen (TYLENOL) 325 MG tablet Take 650 mg by mouth every 6 (six) hours as needed.    [provider]  Cholecalciferol (VITAMIN D3) 1000 units CAPS Take 1,000 Units by mouth daily.    [provider]  ELIQUIS 5 MG TABS tablet Take 2.5 mg by mouth 2 (two) times daily.  12/27/17   [provider]  LUTEIN PO Take by mouth. Take 1 tablet by mouth every Tue, [place], Sat.    [provider]  Melatonin 1 MG TABS Take 1 tablet by mouth at bedtime as needed.     [provider]  Memantine HCl-Donepezil HCl (NAMZARIC) 28-10 MG CP24 Take 1 capsule by mouth every morning. 11/12/18   Pieter Partridge, DO  traMADol (ULTRAM) 50 MG tablet Take by mouth every 8 (eight) hours as needed.    [provider]  estradiol (ESTRACE) 1 MG tablet Take 1 mg by mouth as needed.   03/16/11  [provider]     Positive ROS: Is otherwise negative  All other systems have been reviewed and were otherwise negative with the exception of those mentioned in the HPI and as above.  Physical Exam: General: Alert, no acute distress.  Afebrile.  No hoarseness or trouble breathing. Ears: She had a moderate amount of wax in both ear canals that was removed with forceps.  TMs were clear bilaterally Nasal: Clear nasal passages.  No signs of infection.  Septum midline. Oral: Clear oropharynx.  Status post tonsillectomy. Neck: No palpable adenopathy or masses.  The region of tenderness is around the level of the cricoid cartilage.  No swelling and no tenderness to palpation. Fiberoptic laryngoscopy revealed clear nasopharynx, base of tongue and epiglottis were normal.   Vocal cords were clear bilaterally.  I was able to pass the fiberoptic laryngoscope through the upper esophageal sphincter and the upper cervical esophagus was clear.  Laryngoscopy  Date/Time: 01/23/2019 2:45 PM Performed by: Rozetta Nunnery, MD Authorized by: Rozetta Nunnery, MD   Consent:    Consent obtained:  Verbal  Consent given by:  Patient Procedure details:    Indications: direct visualization of the upper aerodigestive tract     Medication:  Afrin   Instrument: flexible fiberoptic laryngoscope     Scope location: left nare   Septum:    normal   Mouth:    Oropharynx: normal     Vallecula: normal     Base of tongue: normal     Epiglottis: normal   Throat:    Right hypopharynx: normal     Left hypopharynx: normal     Pyriform sinus: normal     False vocal cords: normal     True vocal cords: normal   Post-procedure details:    Patient tolerance of procedure:  Tolerated well, no immediate complications    Assessment: Laryngeal pharyngeal reflux disease  Plan: Prescribed omeprazole 40 mg daily before dinner Also gave her some samples of Cepacol lozenges to try as needed sore throat She will follow-up as needed   Radene Journey, MD

## 2019-02-03 DIAGNOSIS — R070 Pain in throat: Secondary | ICD-10-CM | POA: Diagnosis not present

## 2019-02-13 DIAGNOSIS — Z961 Presence of intraocular lens: Secondary | ICD-10-CM | POA: Diagnosis not present

## 2019-02-13 DIAGNOSIS — H353131 Nonexudative age-related macular degeneration, bilateral, early dry stage: Secondary | ICD-10-CM | POA: Diagnosis not present

## 2019-02-13 DIAGNOSIS — H524 Presbyopia: Secondary | ICD-10-CM | POA: Diagnosis not present

## 2019-02-13 DIAGNOSIS — H5213 Myopia, bilateral: Secondary | ICD-10-CM | POA: Diagnosis not present

## 2019-03-10 DIAGNOSIS — M810 Age-related osteoporosis without current pathological fracture: Secondary | ICD-10-CM | POA: Diagnosis not present

## 2019-03-10 DIAGNOSIS — E7800 Pure hypercholesterolemia, unspecified: Secondary | ICD-10-CM | POA: Diagnosis not present

## 2019-03-10 DIAGNOSIS — I482 Chronic atrial fibrillation, unspecified: Secondary | ICD-10-CM | POA: Diagnosis not present

## 2019-03-10 DIAGNOSIS — I10 Essential (primary) hypertension: Secondary | ICD-10-CM | POA: Diagnosis not present

## 2019-03-10 DIAGNOSIS — G301 Alzheimer's disease with late onset: Secondary | ICD-10-CM | POA: Diagnosis not present

## 2019-03-31 DIAGNOSIS — H43811 Vitreous degeneration, right eye: Secondary | ICD-10-CM | POA: Diagnosis not present

## 2019-03-31 DIAGNOSIS — H47323 Drusen of optic disc, bilateral: Secondary | ICD-10-CM | POA: Diagnosis not present

## 2019-03-31 DIAGNOSIS — H353122 Nonexudative age-related macular degeneration, left eye, intermediate dry stage: Secondary | ICD-10-CM | POA: Diagnosis not present

## 2019-03-31 DIAGNOSIS — H353211 Exudative age-related macular degeneration, right eye, with active choroidal neovascularization: Secondary | ICD-10-CM | POA: Diagnosis not present

## 2019-04-18 DIAGNOSIS — G301 Alzheimer's disease with late onset: Secondary | ICD-10-CM | POA: Diagnosis not present

## 2019-04-18 DIAGNOSIS — D6869 Other thrombophilia: Secondary | ICD-10-CM | POA: Diagnosis not present

## 2019-04-18 DIAGNOSIS — I10 Essential (primary) hypertension: Secondary | ICD-10-CM | POA: Diagnosis not present

## 2019-04-18 DIAGNOSIS — R070 Pain in throat: Secondary | ICD-10-CM | POA: Diagnosis not present

## 2019-04-18 DIAGNOSIS — R42 Dizziness and giddiness: Secondary | ICD-10-CM | POA: Diagnosis not present

## 2019-04-18 DIAGNOSIS — I482 Chronic atrial fibrillation, unspecified: Secondary | ICD-10-CM | POA: Diagnosis not present

## 2019-04-21 NOTE — Progress Notes (Signed)
Due to the COVID-19 crisis, this virtual check-in visit was done via telephone from my office and it was initiated and consent given by this patient and or family.   Telephone (Audio) Visit The purpose of this telephone visit is to provide medical care while limiting exposure to the novel coronavirus.    Consent was obtained for telephone visit and initiated by pt/family:  Yes.   Answered questions that patient had about telehealth interaction:  Yes.   I discussed the limitations, risks, security and privacy concerns of performing an evaluation and management service by telephone. I also discussed with the patient that there may be a patient responsible charge related to this service. The patient expressed understanding and agreed to proceed.  Pt location: Home Physician Location: office Name of referring provider:  Lajean Manes, MD I connected with .Jacqulyn Ducking Strauss at patients initiation/request on 04/22/2019 at  9:50 AM EST by telephone and verified that I am speaking with the correct person using two identifiers.  Pt MRN:  [id number] Pt DOB:  [birth year]   History of Present Illness:  [name] is an 86 year old right-handed woman with paroxysmal atrial fibrillation, hypertension, CAD, polymyalgia rheumatica, depression, IBS and arthritis who follows up for Alzheimer's disease. She is accompanied by her husband who supplements history.  UPDATE: She is currently taking Namzaric. They are continuing to live in their own apartment.  They are planning to move again to [facility].  Her husband notes worsening of memory over the past 6 months but she doesn't think so.  She is still independent with most activities of daily living around the house.  She uses a pillbox to help with remembering to take medication.  They have gotten their vaccines.  Appetite is good.  Sleep is overall good.  She takes melatonin 3mg  at bedtime.     HISTORY: She is a retired  Education officer, museum.She began noticing gradual memory deficits over the past 4 years.At first, she would forget appointments.She would need to use a calendar to remember to keep phone appointments with friends that she had for 6-8 years.She tended to look at the calendar 3 or 4 times a day to make sure she doesn't miss an appointment.She began misplacing objects around the house.She began forgetting recipes that she used for many years.She has lived in [place] for over 50 years.Sometimes when somebody talks to her about a specific location, she has to stop and try to remember where it is.On rare occasions, she has gotten disoriented while driving familiar routes.She cannot remember names of certain streets.She forgets names of acquaintances and it would take a while before she remembers.She enjoys reading, but often she will have to re-read chapters because she forgot the content.She belongs to a book club and has become less vocal because she cannot remember the story as much anymore.When she is in the kitchen, she sometimes forgets where she keeps the pots or pans.She has also begun forgetting to take her medication, so her husband has to remind her.Her husband manages the finances, but she does write checks and pay some bills and has not had any difficulty with that.When she wakes up in the morning, she is not sure of the day or what her plans are for that day.She was given a diagnosis of early Alzheimer's recently, and has been anxious and scared since that time.She easily cries but she says it is due to this news and not because of depression.She denies hallucinations or delusions.She  usually sleeps well.Both her mother and sister had dementia.  She also notes shakiness in her hands.When she writes or holds a utensil, her hand shakes.  MRI of the brain without contrast was performed on 07/10/13, which revealed moderate to severe mesial temporal hippocampal  atrophy as well as numerous bilateral periventricular and subcortical T2 hyperintensities.TSH was 2.850 and methylmalonic acid level was 145.  She has cerebrovascular disease with risk factors such as atrial fibrillation, hypertension, and hyperlipidemia.  Observations/Objective:   There were no vitals taken for this visit. Montreal Cognitive Assessment Blind 06/24/2015 06/16/2014 06/16/2014 11/21/2013  Attention: Read list of digits (0/2) 2 2 2 2   Attention: Read list of letters (0/1) 1 1 1 1   Attention: Serial 7 subtraction starting at 100 (0/3) 1 3 3 3   Language: Repeat phrase (0/2) 2 2 2 2   Language : Fluency (0/1) 1 1 1 1   Abstraction (0/2) 1 2 2 1   Delayed Recall (0/5) 0 0 0 0  Orientation (0/6) 3 6 - 5    Assessment and Plan:   Alzheimer's dementia  1.  Namzaric, melatonin 2.  Follow up in 6 months.  Need for in person visit now:  No. Follow Up Instructions:    -I discussed the assessment and treatment plan with the patient. The patient was provided an opportunity to ask questions and all were answered. The patient agreed with the plan and demonstrated an understanding of the instructions.   The patient was advised to call back or seek an in-person evaluation if the symptoms worsen or if the condition fails to improve as anticipated.    Total Time spent in visit with the patient was:  13 minutes  Dudley Major, DO

## 2019-04-22 ENCOUNTER — Other Ambulatory Visit: Payer: Self-pay

## 2019-04-22 ENCOUNTER — Telehealth (INDEPENDENT_AMBULATORY_CARE_PROVIDER_SITE_OTHER): Payer: Medicare Other | Admitting: Neurology

## 2019-04-22 ENCOUNTER — Encounter: Payer: Self-pay | Admitting: Neurology

## 2019-04-22 DIAGNOSIS — F0280 Dementia in other diseases classified elsewhere without behavioral disturbance: Secondary | ICD-10-CM

## 2019-04-22 DIAGNOSIS — G301 Alzheimer's disease with late onset: Secondary | ICD-10-CM | POA: Diagnosis not present

## 2019-04-30 DIAGNOSIS — G301 Alzheimer's disease with late onset: Secondary | ICD-10-CM | POA: Diagnosis not present

## 2019-04-30 DIAGNOSIS — I10 Essential (primary) hypertension: Secondary | ICD-10-CM | POA: Diagnosis not present

## 2019-04-30 DIAGNOSIS — M810 Age-related osteoporosis without current pathological fracture: Secondary | ICD-10-CM | POA: Diagnosis not present

## 2019-04-30 DIAGNOSIS — I482 Chronic atrial fibrillation, unspecified: Secondary | ICD-10-CM | POA: Diagnosis not present

## 2019-04-30 DIAGNOSIS — E7800 Pure hypercholesterolemia, unspecified: Secondary | ICD-10-CM | POA: Diagnosis not present

## 2019-05-20 ENCOUNTER — Other Ambulatory Visit: Payer: Self-pay | Admitting: Neurology

## 2019-05-20 DIAGNOSIS — F0280 Dementia in other diseases classified elsewhere without behavioral disturbance: Secondary | ICD-10-CM

## 2019-06-03 DIAGNOSIS — R070 Pain in throat: Secondary | ICD-10-CM | POA: Diagnosis not present

## 2019-06-04 DIAGNOSIS — I482 Chronic atrial fibrillation, unspecified: Secondary | ICD-10-CM | POA: Diagnosis not present

## 2019-06-04 DIAGNOSIS — M810 Age-related osteoporosis without current pathological fracture: Secondary | ICD-10-CM | POA: Diagnosis not present

## 2019-06-04 DIAGNOSIS — G301 Alzheimer's disease with late onset: Secondary | ICD-10-CM | POA: Diagnosis not present

## 2019-06-04 DIAGNOSIS — I10 Essential (primary) hypertension: Secondary | ICD-10-CM | POA: Diagnosis not present

## 2019-06-04 DIAGNOSIS — E7800 Pure hypercholesterolemia, unspecified: Secondary | ICD-10-CM | POA: Diagnosis not present

## 2019-06-10 DIAGNOSIS — G301 Alzheimer's disease with late onset: Secondary | ICD-10-CM | POA: Diagnosis not present

## 2019-06-10 DIAGNOSIS — I10 Essential (primary) hypertension: Secondary | ICD-10-CM | POA: Diagnosis not present

## 2019-06-10 DIAGNOSIS — R42 Dizziness and giddiness: Secondary | ICD-10-CM | POA: Diagnosis not present

## 2019-06-10 DIAGNOSIS — I482 Chronic atrial fibrillation, unspecified: Secondary | ICD-10-CM | POA: Diagnosis not present

## 2019-06-18 DIAGNOSIS — Z1159 Encounter for screening for other viral diseases: Secondary | ICD-10-CM | POA: Diagnosis not present

## 2019-06-18 DIAGNOSIS — Z20828 Contact with and (suspected) exposure to other viral communicable diseases: Secondary | ICD-10-CM | POA: Diagnosis not present

## 2019-06-25 DIAGNOSIS — Z20828 Contact with and (suspected) exposure to other viral communicable diseases: Secondary | ICD-10-CM | POA: Diagnosis not present

## 2019-06-25 DIAGNOSIS — Z1159 Encounter for screening for other viral diseases: Secondary | ICD-10-CM | POA: Diagnosis not present

## 2019-06-27 DIAGNOSIS — H47323 Drusen of optic disc, bilateral: Secondary | ICD-10-CM | POA: Diagnosis not present

## 2019-06-27 DIAGNOSIS — H43811 Vitreous degeneration, right eye: Secondary | ICD-10-CM | POA: Diagnosis not present

## 2019-06-27 DIAGNOSIS — H353231 Exudative age-related macular degeneration, bilateral, with active choroidal neovascularization: Secondary | ICD-10-CM | POA: Diagnosis not present

## 2019-07-02 ENCOUNTER — Ambulatory Visit: Payer: Medicare Other | Admitting: Cardiovascular Disease

## 2019-07-02 DIAGNOSIS — Z1159 Encounter for screening for other viral diseases: Secondary | ICD-10-CM | POA: Diagnosis not present

## 2019-07-02 DIAGNOSIS — Z20828 Contact with and (suspected) exposure to other viral communicable diseases: Secondary | ICD-10-CM | POA: Diagnosis not present

## 2019-07-09 DIAGNOSIS — Z1159 Encounter for screening for other viral diseases: Secondary | ICD-10-CM | POA: Diagnosis not present

## 2019-07-09 DIAGNOSIS — Z20828 Contact with and (suspected) exposure to other viral communicable diseases: Secondary | ICD-10-CM | POA: Diagnosis not present

## 2019-07-16 DIAGNOSIS — Z1159 Encounter for screening for other viral diseases: Secondary | ICD-10-CM | POA: Diagnosis not present

## 2019-07-16 DIAGNOSIS — Z20828 Contact with and (suspected) exposure to other viral communicable diseases: Secondary | ICD-10-CM | POA: Diagnosis not present

## 2019-07-23 DIAGNOSIS — Z1159 Encounter for screening for other viral diseases: Secondary | ICD-10-CM | POA: Diagnosis not present

## 2019-07-23 DIAGNOSIS — Z20828 Contact with and (suspected) exposure to other viral communicable diseases: Secondary | ICD-10-CM | POA: Diagnosis not present

## 2019-07-24 ENCOUNTER — Other Ambulatory Visit: Payer: Self-pay

## 2019-07-24 ENCOUNTER — Ambulatory Visit (INDEPENDENT_AMBULATORY_CARE_PROVIDER_SITE_OTHER): Payer: Medicare Other | Admitting: Cardiovascular Disease

## 2019-07-24 ENCOUNTER — Encounter: Payer: Self-pay | Admitting: Cardiovascular Disease

## 2019-07-24 VITALS — BP 122/72 | HR 76 | Ht 62.0 in | Wt 108.8 lb

## 2019-07-24 DIAGNOSIS — I480 Paroxysmal atrial fibrillation: Secondary | ICD-10-CM

## 2019-07-24 DIAGNOSIS — I10 Essential (primary) hypertension: Secondary | ICD-10-CM | POA: Diagnosis not present

## 2019-07-24 LAB — CBC WITH DIFFERENTIAL/PLATELET
Basophils Absolute: 0.1 10*3/uL (ref 0.0–0.2)
Basos: 1 %
EOS (ABSOLUTE): 0.1 10*3/uL (ref 0.0–0.4)
Eos: 2 %
Hematocrit: 44.8 % (ref 34.0–46.6)
Hemoglobin: 15.5 g/dL (ref 11.1–15.9)
Immature Grans (Abs): 0 10*3/uL (ref 0.0–0.1)
Immature Granulocytes: 0 %
Lymphocytes Absolute: 1.5 10*3/uL (ref 0.7–3.1)
Lymphs: 25 %
MCH: 33 pg (ref 26.6–33.0)
MCHC: 34.6 g/dL (ref 31.5–35.7)
MCV: 95 fL (ref 79–97)
Monocytes Absolute: 0.7 10*3/uL (ref 0.1–0.9)
Monocytes: 12 %
Neutrophils Absolute: 3.6 10*3/uL (ref 1.4–7.0)
Neutrophils: 60 %
Platelets: 158 10*3/uL (ref 150–450)
RBC: 4.7 x10E6/uL (ref 3.77–5.28)
RDW: 11.4 % — ABNORMAL LOW (ref 11.7–15.4)
WBC: 6 10*3/uL (ref 3.4–10.8)

## 2019-07-24 LAB — BASIC METABOLIC PANEL
BUN/Creatinine Ratio: 26 (ref 12–28)
BUN: 21 mg/dL (ref 8–27)
CO2: 27 mmol/L (ref 20–29)
Calcium: 9.5 mg/dL (ref 8.7–10.3)
Chloride: 105 mmol/L (ref 96–106)
Creatinine, Ser: 0.8 mg/dL (ref 0.57–1.00)
GFR calc Af Amer: 78 mL/min/{1.73_m2} (ref >59)
GFR calc non Af Amer: 68 mL/min/{1.73_m2} (ref >59)
Glucose: 70 mg/dL (ref 65–99)
Potassium: 4.6 mmol/L (ref 3.5–5.2)
Sodium: 143 mmol/L (ref 134–144)

## 2019-07-24 NOTE — Progress Notes (Signed)
Cardiology Office Note   Date:  07/24/2019   ID:  [name], DOB Jul 18, 1933, MRN [id number]  PCP:  Donna Manes, MD  Cardiologist:   Donna Moores, MD ( transitioning from Dr. Ron Stone)   No chief complaint on file.  Problem list 1. Mild coronary artery disease 2. Mild carotid artery disease 3. Paroxysmal atrial fibrillation-on Coumadin 4. Essential hypertension 5. Hyperlipidemia    [name] is a 86 y.o. female who presents to establish care. She is a previous patient of Dr. Ron Stone. She has a history of mild coronary artery disease, carotid artery disease. She admits to having some memory problems. She has paroxysmal atrial fibrillation and is on Coumadin.     HR is slow,  she denies having any syncope or presyncope. She does all of her typical household chores without any difficulty . She exercises several times a week.  June 22, 2015:  [name] is doing well.  Told me that she has some dementia.   June 27, 2016:  Seen back today for PAF, CAD  and palpitations  Exercising regularly  Has moved into [facility]  Jul 12, 2017:  [name] is seen today   no CP .   HR is variable.    Has a hx of PAF  - on couamdin  Dx with alzheimers 2 years ago    .  Slowly worsening   Jul 24, 2019:  [name] is seen today  Hx of PAF.  Dx with alzheimers 2 years ago  Seen with her husband Having some vision issues with left eye.   Past Medical History:  Diagnosis Date  . Anxiety   . Atrophic vaginitis   . CAD (coronary artery disease) 2008/2010   Catheterization 2008, 40% LAD after first diagonal  /  nuclear January, 2010 normal  . Carotid artery disease ([place])    Doppler, July, 2011, 0-39% bilateral  . Decreased platelet count ([place]) 04/2011   142,000  . Endometrial polyp   . GERD (gastroesophageal reflux disease)   . Hyperlipidemia   . Hypertension   . Irritable bowel syndrome with constipation   . Memory difficulty 01-20-13   "memory issues"  . Normal nuclear  stress test Feb 2013   No ischemia. Normal wall motion. EF greater than 70%  . Osteoarthritis   . Osteopenia   . Osteoporosis   . Palpitations    Monitor, May, 2013, no atrial fib,  one short run of PSVT  . Paroxysmal atrial fibrillation (H[facility])    Coumadin therapy  . Polymyalgia rheumatica ([place])   . Spinal stenosis   . Stress   . Vitamin D deficiency     Past Surgical History:  Procedure Laterality Date  . BREAST SURGERY     BENIGN BREAST LUMP  . CATARACT SURGERY  2009  . COLONOSCOPY WITH PROPOFOL N/A 02/11/2013   Procedure: COLONOSCOPY WITH PROPOFOL;  Surgeon: [name], MD;  Location: [facility];  Service: Endoscopy;  Laterality: N/A;  . DILATION AND CURETTAGE OF UTERUS    . EXCISION MORTON'S NEUROMA  2000  . HIP SURGERY Right 2005   debridement  . HYSTEROSCOPY  02/07/2005   HYST W D&C, DX: PMB W SMALL ENDO POLYP-SURGEON [name]  . LAMINECTOMY  March 2009   L4 through L5  . TONSILLECTOMY       Current Outpatient Medications  Medication Sig Dispense Refill  . acetaminophen (TYLENOL) 325 MG tablet Take 650 mg by mouth every 6 (six) hours as needed.    [name]  Cholecalciferol (VITAMIN D3) 1000 units CAPS Take 1,000 Units by mouth daily.    [name] ELIQUIS 5 MG TABS tablet Take 2.5 mg by mouth 2 (two) times daily.     [name] gabapentin (NEURONTIN) 100 MG capsule Take 1 capsule by mouth 2 (two) times daily.    . LUTEIN PO Take by mouth. Take 1 tablet by mouth every Tue, [place], Sat.    . Melatonin 1 MG TABS Take 1 tablet by mouth at bedtime as needed.     [name] NAMZARIC 28-10 MG CP24 TAKE ONE CAPSULE BY MOUTH EVERY MORNING 90 capsule 0  . traMADol (ULTRAM) 50 MG tablet Take by mouth every 8 (eight) hours as needed.     No current facility-administered medications for this visit.    Allergies:   Patient has no known allergies.    Social History:  The patient  reports that she has never smoked. She has never used smokeless tobacco. She reports that she does not drink alcohol or use  drugs.   Family History:  The patient's family history includes Cancer in her father; Dementia in her sister; Diabetes in her sister; Heart disease in her brother and sister; Hypertension in her brother and sister; Pancreatitis in her father; Spina bifida in her brother; Stroke in her mother and sister; Transient ischemic attack in her mother.    ROS:    Noted in current hx., otherwise negativ eNoted in current history, otherwise review systems is negative.   Physical Exam: Blood pressure 122/72, pulse 76, height 5\' 2"  (1.575 m), weight 108 lb 12 oz (49.3 kg), SpO2 99 %.  GEN:   Elderly female,  NAD  HEENT: Normal NECK: No JVD; No carotid bruits LYMPHATICS: No lymphadenopathy CARDIAC:Irreg. Irreg.  RESPIRATORY:  Clear to auscultation without rales, wheezing or rhonchi  ABDOMEN: Soft, non-tender, non-distended MUSCULOSKELETAL:  No edema; No deformity  SKIN: Warm and dry NEUROLOGIC:  Alert and oriented x 3   EKG:      Jul 24, 2019: Atrial fibrillation with a controlled ventricular response at 71.  No ST or T wave changes.  Recent Labs: No results found for requested labs within last 8760 hours.    Lipid Panel    Component Value Date/Time   CHOL 187 06/27/2016 0909   TRIG 91 06/27/2016 0909   HDL 75 06/27/2016 0909   CHOLHDL 2.5 06/27/2016 0909   CHOLHDL 3.2 06/22/2015 0825   VLDL 13 06/22/2015 0825   LDLCALC 94 06/27/2016 0909   LDLDIRECT 119.7 08/26/2009 1007      Wt Readings from Last 3 Encounters:  07/24/19 108 lb 12 oz (49.3 kg)  01/08/19 108 lb (49 kg)  11/26/18 105 lb (47.6 kg)      Other studies Reviewed: Additional studies/ records that were reviewed today include: . Review of the above records demonstrates:    ASSESSMENT AND PLAN:  1. Mild coronary artery disease:   No angina .     3.  atrial fibrillation:   She is I persistent atrial fib .  Cont Eliquis 2.5 BID   4. Essential hypertension:     BP is well controlled.    Current medicines are  reviewed at length with the patient today.  The patient does not have concerns regarding medicines.  The following changes have been made:  no change  Labs/ tests ordered today include:   Orders Placed This Encounter  Procedures  . Basic metabolic panel  . CBC with Differential/Platelet  . EKG 12-Lead  Disposition:   FU with me in 1 year     Donna Moores, MD  07/24/2019 10:03 AM    [address] [place], [place],   [address] Phone: [phone]; Fax: [phone]

## 2019-07-24 NOTE — Patient Instructions (Signed)
Medication Instructions:  Your provider recommends that you continue on your current medications as directed. Please refer to the Current Medication list given to you today.   *If you need a refill on your cardiac medications before your next appointment, please call your pharmacy*  Lab Work: TODAY! BMET< CBC If you have labs (blood work) drawn today and your tests are completely normal, you will receive your results only by: [name] MyChart Message (if you have MyChart) OR . A paper copy in the mail If you have any lab test that is abnormal or we need to change your treatment, we will call you to review the results.  Follow-Up: At [facility], you and your health needs are our priority.  As part of our continuing mission to provide you with exceptional heart care, we have created designated Provider Care Teams.  These Care Teams include your primary Cardiologist (physician) and Advanced Practice Providers (APPs -  Physician Assistants and Nurse Practitioners) who all work together to provide you with the care you need, when you need it. Your next appointment:   12 month(s) The format for your next appointment:   In Person Provider:   You may see Mertie Moores, MD or one of the following Advanced Practice Providers on your designated Care Team:    Richardson Dopp, PA-C  [place], [place]

## 2019-07-25 ENCOUNTER — Encounter: Payer: Self-pay | Admitting: Nurse Practitioner

## 2019-07-29 DIAGNOSIS — H47323 Drusen of optic disc, bilateral: Secondary | ICD-10-CM | POA: Diagnosis not present

## 2019-07-29 DIAGNOSIS — H43811 Vitreous degeneration, right eye: Secondary | ICD-10-CM | POA: Diagnosis not present

## 2019-07-29 DIAGNOSIS — H353231 Exudative age-related macular degeneration, bilateral, with active choroidal neovascularization: Secondary | ICD-10-CM | POA: Diagnosis not present

## 2019-07-30 DIAGNOSIS — Z1159 Encounter for screening for other viral diseases: Secondary | ICD-10-CM | POA: Diagnosis not present

## 2019-07-30 DIAGNOSIS — Z20828 Contact with and (suspected) exposure to other viral communicable diseases: Secondary | ICD-10-CM | POA: Diagnosis not present

## 2019-08-26 DIAGNOSIS — Z1389 Encounter for screening for other disorder: Secondary | ICD-10-CM | POA: Diagnosis not present

## 2019-08-26 DIAGNOSIS — M810 Age-related osteoporosis without current pathological fracture: Secondary | ICD-10-CM | POA: Diagnosis not present

## 2019-08-26 DIAGNOSIS — I482 Chronic atrial fibrillation, unspecified: Secondary | ICD-10-CM | POA: Diagnosis not present

## 2019-08-26 DIAGNOSIS — I10 Essential (primary) hypertension: Secondary | ICD-10-CM | POA: Diagnosis not present

## 2019-08-26 DIAGNOSIS — G301 Alzheimer's disease with late onset: Secondary | ICD-10-CM | POA: Diagnosis not present

## 2019-08-26 DIAGNOSIS — Z23 Encounter for immunization: Secondary | ICD-10-CM | POA: Diagnosis not present

## 2019-08-26 DIAGNOSIS — R070 Pain in throat: Secondary | ICD-10-CM | POA: Diagnosis not present

## 2019-08-26 DIAGNOSIS — G8929 Other chronic pain: Secondary | ICD-10-CM | POA: Diagnosis not present

## 2019-08-26 DIAGNOSIS — Z0000 Encounter for general adult medical examination without abnormal findings: Secondary | ICD-10-CM | POA: Diagnosis not present

## 2019-08-26 DIAGNOSIS — D6869 Other thrombophilia: Secondary | ICD-10-CM | POA: Diagnosis not present

## 2019-09-05 ENCOUNTER — Other Ambulatory Visit: Payer: Self-pay | Admitting: Neurology

## 2019-09-05 DIAGNOSIS — F0280 Dementia in other diseases classified elsewhere without behavioral disturbance: Secondary | ICD-10-CM

## 2019-09-11 DIAGNOSIS — I10 Essential (primary) hypertension: Secondary | ICD-10-CM | POA: Diagnosis not present

## 2019-09-11 DIAGNOSIS — M810 Age-related osteoporosis without current pathological fracture: Secondary | ICD-10-CM | POA: Diagnosis not present

## 2019-09-11 DIAGNOSIS — E7800 Pure hypercholesterolemia, unspecified: Secondary | ICD-10-CM | POA: Diagnosis not present

## 2019-09-11 DIAGNOSIS — I482 Chronic atrial fibrillation, unspecified: Secondary | ICD-10-CM | POA: Diagnosis not present

## 2019-09-11 DIAGNOSIS — G301 Alzheimer's disease with late onset: Secondary | ICD-10-CM | POA: Diagnosis not present

## 2019-10-07 DIAGNOSIS — J312 Chronic pharyngitis: Secondary | ICD-10-CM | POA: Diagnosis not present

## 2019-10-09 ENCOUNTER — Other Ambulatory Visit: Payer: Self-pay | Admitting: Neurology

## 2019-10-09 DIAGNOSIS — F0280 Dementia in other diseases classified elsewhere without behavioral disturbance: Secondary | ICD-10-CM

## 2019-10-09 DIAGNOSIS — G309 Alzheimer's disease, unspecified: Secondary | ICD-10-CM

## 2019-10-20 ENCOUNTER — Ambulatory Visit: Payer: Medicare Other | Admitting: Neurology

## 2019-10-24 NOTE — Progress Notes (Deleted)
NEUROLOGY FOLLOW UP OFFICE NOTE  CHRISTIONNA POLAND [id number]  HISTORY OF PRESENT ILLNESS: [name] is an 86 year old right-handed woman with paroxysmal atrial fibrillation, hypertension, CAD, polymyalgia rheumatica, depression, IBS and arthritis who follows up for Alzheimer's dementia. She is accompanied by her husband who supplements history.  UPDATE: She is currently taking Namzaric. They are continuing to live in their own apartment. They are planning to move again to [facility].  Her husband notes worsening of memory over the past 6 months but she doesn't think so.  She is still independent with most activities of daily living around the house.  She uses a pillbox to help with remembering to take medication.  They have gotten their vaccines.  Appetite is good.  Sleep is overall good.  She takes melatonin 3mg  at bedtime.     HISTORY: She is a retired Education officer, museum.She began noticing gradual memory deficits over the past 4 years.At first, she would forget appointments.She would need to use a calendar to remember to keep phone appointments with friends that she had for 6-8 years.She tended to look at the calendar 3 or 4 times a day to make sure she doesn't miss an appointment.She began misplacing objects around the house.She began forgetting recipes that she used for many years.She has lived in [place] for over 36 years.Sometimes when somebody talks to her about a specific location, she has to stop and try to remember where it is.On rare occasions, she has gotten disoriented while driving familiar routes.She cannot remember names of certain streets.She forgets names of acquaintances and it would take a while before she remembers.She enjoys reading, but often she will have to re-read chapters because she forgot the content.She belongs to a book club and has become less vocal because she cannot remember the story as much  anymore.When she is in the kitchen, she sometimes forgets where she keeps the pots or pans.She has also begun forgetting to take her medication, so her husband has to remind her.Her husband manages the finances, but she does write checks and pay some bills and has not had any difficulty with that.When she wakes up in the morning, she is not sure of the day or what her plans are for that day.She was given a diagnosis of early Alzheimer's recently, and has been anxious and scared since that time.She easily cries but she says it is due to this news and not because of depression.She denies hallucinations or delusions.She usually sleeps well.Both her mother and sister had dementia.  She also notes shakiness in her hands.When she writes or holds a utensil, her hand shakes.  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  She has cerebrovascular disease with risk factors such as atrial fibrillation, hypertension, and hyperlipidemia.  PAST MEDICAL HISTORY: Past Medical History:  Diagnosis Date  . Anxiety   . Atrophic vaginitis   . CAD (coronary artery disease) 2008/2010   Catheterization 2008, 40% LAD after first diagonal  /  nuclear January, 2010 normal  . Carotid artery disease ([place])    Doppler, July, 2011, 0-39% bilateral  . Decreased platelet count ([place]) 04/2011   142,000  . Endometrial polyp   . GERD (gastroesophageal reflux disease)   . Hyperlipidemia   . Hypertension   . Irritable bowel syndrome with constipation   .  Memory difficulty 01-20-13   "memory issues"  . Normal nuclear stress test Feb 2013   No ischemia. Normal wall motion. EF greater than 70%  . Osteoarthritis   . Osteopenia   . Osteoporosis   . Palpitations    Monitor, May, 2013,  no atrial fib,  one short run of PSVT  . Paroxysmal atrial fibrillation (HCC)    Coumadin therapy  . Polymyalgia rheumatica ([place])   . Spinal stenosis   . Stress   . Vitamin D deficiency     MEDICATIONS: Current Outpatient Medications on File Prior to Visit  Medication Sig Dispense Refill  . acetaminophen (TYLENOL) 325 MG tablet Take 650 mg by mouth every 6 (six) hours as needed.    . Cholecalciferol (VITAMIN D3) 1000 units CAPS Take 1,000 Units by mouth daily.    [name] ELIQUIS 5 MG TABS tablet Take 2.5 mg by mouth 2 (two) times daily.     [name] gabapentin (NEURONTIN) 100 MG capsule Take 1 capsule by mouth 2 (two) times daily.    . LUTEIN PO Take by mouth. Take 1 tablet by mouth every Tue, [place], Sat.    . Melatonin 1 MG TABS Take 1 tablet by mouth at bedtime as needed.     [name] NAMZARIC 28-10 MG CP24 TAKE ONE CAPSULE BY MOUTH EVERY MORNING 30 capsule 0  . traMADol (ULTRAM) 50 MG tablet Take by mouth every 8 (eight) hours as needed.    . [DISCONTINUED] estradiol (ESTRACE) 1 MG tablet Take 1 mg by mouth as needed.      No current facility-administered medications on file prior to visit.    ALLERGIES: No Known Allergies  FAMILY HISTORY: Family History  Problem Relation Age of Onset  . Transient ischemic attack Mother   . Stroke Mother   . Cancer Father        PROSTATE  . Pancreatitis Father   . Stroke Sister   . Diabetes Sister   . Hypertension Sister   . Heart disease Sister   . Dementia Sister   . Spina bifida Brother   . Hypertension Brother   . Heart disease Brother    ***.  SOCIAL HISTORY: Social History   Socioeconomic History  . Marital status: Married    Spouse name: Jeneen Rinks  . Number of children: 2  . Years of education: Masters  . Highest education level: Not on file  Occupational History  . Occupation: Retired    Fish farm manager: RETIRED  Tobacco Use  . Smoking status: Never Smoker  . Smokeless tobacco: Never Used  Vaping Use  . Vaping Use: Never used  Substance and  Sexual Activity  . Alcohol use: No  . Drug use: No  . Sexual activity: Never    Birth control/protection: Post-menopausal  Other Topics Concern  . Not on file  Social History Narrative   Patient is married Jeneen Rinks) and lives at home with her husband.   Patient has two children.   Retired; Married;   Patient is right-handed.   Patient drinks very little caffeine.   Patient has a Scientist, water quality.   Has had increased stress as her husband had a stroke ~ 3wks ago and she has been taking care of him. (04/07/11)   Social Determinants of Health   Financial Resource Strain:   . Difficulty of Paying Living Expenses:   Food Insecurity:   . Worried About Charity fundraiser in the Last Year:   . [place] in the Last Year:  Transportation Needs:   . Film/video editor (Medical):   [name] Lack of Transportation (Non-Medical):   Physical Activity:   . Days of Exercise per Week:   . Minutes of Exercise per Session:   Stress:   . Feeling of Stress :   Social Connections:   . Frequency of Communication with Friends and Family:   . Frequency of Social Gatherings with Friends and Family:   . Attends Religious Services:   . Active Member of Clubs or Organizations:   . Attends Archivist Meetings:   [name] Marital Status:   Intimate Partner Violence:   . Fear of Current or Ex-Partner:   . Emotionally Abused:   [name] Physically Abused:   . Sexually Abused:     REVIEW OF SYSTEMS: Constitutional: No fevers, chills, or sweats, no generalized fatigue, change in appetite Eyes: No visual changes, double vision, eye pain Ear, nose and throat: No hearing loss, ear pain, nasal congestion, sore throat Cardiovascular: No chest pain, palpitations Respiratory:  No shortness of breath at rest or with exertion, wheezes GastrointestinaI: No nausea, vomiting, diarrhea, abdominal pain, fecal incontinence Genitourinary:  No dysuria, urinary retention or frequency Musculoskeletal:  No neck pain, back  pain Integumentary: No rash, pruritus, skin lesions Neurological: as above Psychiatric: No depression, insomnia, anxiety Endocrine: No palpitations, fatigue, diaphoresis, mood swings, change in appetite, change in weight, increased thirst Hematologic/Lymphatic:  No purpura, petechiae. Allergic/Immunologic: no itchy/runny eyes, nasal congestion, recent allergic reactions, rashes  PHYSICAL EXAM: *** General: No acute distress.  Patient appears ***-groomed.   Head:  Normocephalic/atraumatic Eyes:  Fundi examined but not visualized Neck: supple, no paraspinal tenderness, full range of motion Heart:  Regular rate and rhythm Lungs:  Clear to auscultation bilaterally Back: No paraspinal tenderness Neurological Exam: alert and oriented to person, place, and time. Attention span and concentration intact, recent and remote memory intact, fund of knowledge intact.  Speech fluent and not dysarthric, language intact.  CN II-XII intact. Bulk and tone normal, muscle strength 5/5 throughout.  Sensation to light touch, temperature and vibration intact.  Deep tendon reflexes 2+ throughout, toes downgoing.  Finger to nose and heel to shin testing intact.  Gait normal, Romberg negative.  IMPRESSION: ***  PLAN: ***  Metta Clines, DO  CC: ***

## 2019-10-27 ENCOUNTER — Ambulatory Visit: Payer: Medicare Other | Admitting: Neurology

## 2019-10-28 DIAGNOSIS — E7800 Pure hypercholesterolemia, unspecified: Secondary | ICD-10-CM | POA: Diagnosis not present

## 2019-10-28 DIAGNOSIS — G301 Alzheimer's disease with late onset: Secondary | ICD-10-CM | POA: Diagnosis not present

## 2019-10-28 DIAGNOSIS — I10 Essential (primary) hypertension: Secondary | ICD-10-CM | POA: Diagnosis not present

## 2019-10-28 DIAGNOSIS — M810 Age-related osteoporosis without current pathological fracture: Secondary | ICD-10-CM | POA: Diagnosis not present

## 2019-10-28 DIAGNOSIS — I482 Chronic atrial fibrillation, unspecified: Secondary | ICD-10-CM | POA: Diagnosis not present

## 2019-11-04 DIAGNOSIS — H353231 Exudative age-related macular degeneration, bilateral, with active choroidal neovascularization: Secondary | ICD-10-CM | POA: Diagnosis not present

## 2019-11-04 DIAGNOSIS — H43811 Vitreous degeneration, right eye: Secondary | ICD-10-CM | POA: Diagnosis not present

## 2019-11-04 DIAGNOSIS — H47323 Drusen of optic disc, bilateral: Secondary | ICD-10-CM | POA: Diagnosis not present

## 2019-11-11 DIAGNOSIS — J312 Chronic pharyngitis: Secondary | ICD-10-CM | POA: Diagnosis not present

## 2019-11-11 DIAGNOSIS — G301 Alzheimer's disease with late onset: Secondary | ICD-10-CM | POA: Diagnosis not present

## 2019-11-11 DIAGNOSIS — I10 Essential (primary) hypertension: Secondary | ICD-10-CM | POA: Diagnosis not present

## 2019-11-18 ENCOUNTER — Other Ambulatory Visit: Payer: Self-pay | Admitting: Neurology

## 2019-11-18 DIAGNOSIS — F0280 Dementia in other diseases classified elsewhere without behavioral disturbance: Secondary | ICD-10-CM

## 2019-12-16 DIAGNOSIS — H353221 Exudative age-related macular degeneration, left eye, with active choroidal neovascularization: Secondary | ICD-10-CM | POA: Diagnosis not present

## 2019-12-23 DIAGNOSIS — R1111 Vomiting without nausea: Secondary | ICD-10-CM | POA: Diagnosis not present

## 2019-12-23 DIAGNOSIS — Z20822 Contact with and (suspected) exposure to covid-19: Secondary | ICD-10-CM | POA: Diagnosis not present

## 2019-12-26 DIAGNOSIS — I10 Essential (primary) hypertension: Secondary | ICD-10-CM | POA: Diagnosis not present

## 2019-12-26 DIAGNOSIS — I482 Chronic atrial fibrillation, unspecified: Secondary | ICD-10-CM | POA: Diagnosis not present

## 2019-12-26 DIAGNOSIS — Z23 Encounter for immunization: Secondary | ICD-10-CM | POA: Diagnosis not present

## 2019-12-26 DIAGNOSIS — J312 Chronic pharyngitis: Secondary | ICD-10-CM | POA: Diagnosis not present

## 2019-12-26 DIAGNOSIS — G301 Alzheimer's disease with late onset: Secondary | ICD-10-CM | POA: Diagnosis not present

## 2019-12-31 NOTE — Progress Notes (Signed)
NEUROLOGY FOLLOW UP OFFICE NOTE  [name] [id number]  HISTORY OF PRESENT ILLNESS: [name] is an 86 year old right-handed woman with paroxysmal atrial fibrillation, hypertension, CAD, polymyalgia rheumatica, depression, IBS and arthritis who follows up for Alzheimer's disease. She is accompanied by her husband who supplements history.  UPDATE: Memory deficits mildly progressed.  Stopped Namzaric because it was hard to swallow and caused throat pain.  Moved to a new private house 2 weeks ago.  At [facility], the apartment was loud.  The neighbors kept them up at night.  Much better.  They live closer to their daughter who comes by more often to help.  Neighbors are nice.  When she wakes up in the morning, she is a little confused about where she is.   HISTORY: She is a retired Education officer, museum.She began noticing gradual memory deficits over the past 4 years.At first, she would forget appointments.She would need to use a calendar to remember to keep phone appointments with friends that she had for 6-8 years.She tended to look at the calendar 3 or 4 times a day to make sure she doesn't miss an appointment.She began misplacing objects around the house.She began forgetting recipes that she used for many years.She has lived in [place] for over 61 years.Sometimes when somebody talks to her about a specific location, she has to stop and try to remember where it is.On rare occasions, she has gotten disoriented while driving familiar routes.She cannot remember names of certain streets.She forgets names of acquaintances and it would take a while before she remembers.She enjoys reading, but often she will have to re-read chapters because she forgot the content.She belongs to a book club and has become less vocal because she cannot remember the story as much anymore.When she is in the kitchen, she sometimes forgets where she keeps the pots or pans.She has also begun  forgetting to take her medication, so her husband has to remind her.Her husband manages the finances, but she does write checks and pay some bills and has not had any difficulty with that.When she wakes up in the morning, she is not sure of the day or what her plans are for that day.She was given a diagnosis of early Alzheimer's recently, and has been anxious and scared since that time.She easily cries but she says it is due to this news and not because of depression.She denies hallucinations or delusions.She usually sleeps well.Both her mother and sister had dementia.  She also notes shakiness in her hands.When she writes or holds a utensil, her hand shakes.  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  She has cerebrovascular disease with risk factors such as atrial fibrillation, hypertension, and hyperlipidemia.  PAST MEDICAL HISTORY: Past Medical History:  Diagnosis Date  . Anxiety   . Atrophic vaginitis   . CAD (coronary artery disease) 2008/2010   Catheterization 2008, 40% LAD after first diagonal  /  nuclear January, 2010 normal  . Carotid artery disease ([place])    Doppler, July, 2011, 0-39% bilateral  . Decreased platelet count ([place]) 04/2011   142,000  . Endometrial polyp   . GERD (gastroesophageal reflux disease)   . Hyperlipidemia   . Hypertension   . Irritable bowel syndrome with constipation   . Memory difficulty 01-20-13   "memory issues"  . Normal nuclear  stress test Feb 2013   No ischemia. Normal wall motion. EF greater than 70%  . Osteoarthritis   . Osteopenia   . Osteoporosis   . Palpitations    Monitor, May, 2013, no atrial fib,  one short run of PSVT  . Paroxysmal atrial fibrillation (HCC)    Coumadin therapy  . Polymyalgia  rheumatica ([place])   . Spinal stenosis   . Stress   . Vitamin D deficiency     MEDICATIONS: Current Outpatient Medications on File Prior to Visit  Medication Sig Dispense Refill  . acetaminophen (TYLENOL) 325 MG tablet Take 650 mg by mouth every 6 (six) hours as needed.    . Cholecalciferol (VITAMIN D3) 1000 units CAPS Take 1,000 Units by mouth daily.    [name] ELIQUIS 5 MG TABS tablet Take 2.5 mg by mouth 2 (two) times daily.     [name] gabapentin (NEURONTIN) 100 MG capsule Take 1 capsule by mouth 2 (two) times daily.    . LUTEIN PO Take by mouth. Take 1 tablet by mouth every Tue, [place], Sat.    . Melatonin 1 MG TABS Take 1 tablet by mouth at bedtime as needed.     [name] NAMZARIC 28-10 MG CP24 TAKE ONE CAPSULE BY MOUTH EVERY MORNING 30 capsule 0  . traMADol (ULTRAM) 50 MG tablet Take by mouth every 8 (eight) hours as needed.    . [DISCONTINUED] estradiol (ESTRACE) 1 MG tablet Take 1 mg by mouth as needed.      No current facility-administered medications on file prior to visit.    ALLERGIES: No Known Allergies  FAMILY HISTORY: Family History  Problem Relation Age of Onset  . Transient ischemic attack Mother   . Stroke Mother   . Cancer Father        PROSTATE  . Pancreatitis Father   . Stroke Sister   . Diabetes Sister   . Hypertension Sister   . Heart disease Sister   . Dementia Sister   . Spina bifida Brother   . Hypertension Brother   . Heart disease Brother     SOCIAL HISTORY: Social History   Socioeconomic History  . Marital status: Married    Spouse name: Jeneen Rinks  . Number of children: 2  . Years of education: Masters  . Highest education level: Not on file  Occupational History  . Occupation: Retired    Fish farm manager: RETIRED  Tobacco Use  . Smoking status: Never Smoker  . Smokeless tobacco: Never Used  Vaping Use  . Vaping Use: Never used  Substance and Sexual Activity  . Alcohol use: No  . Drug use: No  . Sexual activity: Never    Birth control/protection:  Post-menopausal  Other Topics Concern  . Not on file  Social History Narrative   Patient is married Jeneen Rinks) and lives at home with her husband.   Patient has two children.   Retired; Married;   Patient is right-handed.   Patient drinks very little caffeine.   Patient has a Scientist, water quality.   Has had increased stress as her husband had a stroke ~ 3wks ago and she has been taking care of him. (04/07/11)   Social Determinants of Health   Financial Resource Strain:   . Difficulty of Paying Living Expenses: Not on file  Food Insecurity:   . Worried About Charity fundraiser in the Last Year: Not on file  . Ran Out of Food in the Last Year: Not on file  Transportation Needs:   .  Lack of Transportation (Medical): Not on file  . Lack of Transportation (Non-Medical): Not on file  Physical Activity:   . Days of Exercise per Week: Not on file  . Minutes of Exercise per Session: Not on file  Stress:   . Feeling of Stress : Not on file  Social Connections:   . Frequency of Communication with Friends and Family: Not on file  . Frequency of Social Gatherings with Friends and Family: Not on file  . Attends Religious Services: Not on file  . Active Member of Clubs or Organizations: Not on file  . Attends Archivist Meetings: Not on file  . Marital Status: Not on file  Intimate Partner Violence:   . Fear of Current or Ex-Partner: Not on file  . Emotionally Abused: Not on file  . Physically Abused: Not on file  . Sexually Abused: Not on file   PHYSICAL EXAM: Blood pressure 109/76, pulse (!) 102, resp. rate 20, height 5\' 5"  (1.651 m), weight 113 lb (51.3 kg), SpO2 98 %. General: No acute distress.  Patient appears well-groomed.   Head:  Normocephalic/atraumatic Eyes:  Fundi examined but not visualized Neck: supple, no paraspinal tenderness, full range of motion Heart:  Regular rate and rhythm Lungs:  Clear to auscultation bilaterally Back: No paraspinal tenderness Neurological  Exam:  MMSE - Mini Mental State Exam 01/02/2020 01/08/2018 07/09/2017 01/08/2017 09/07/2016 04/25/2016 10/12/2015  Not completed: - (No Data) - - - - -  Orientation to time 1 2 3 3 3 2 5   Orientation to Place 0 3 3 3 3 2 4   Registration 3 3 3 3 3 3 3   Attention/ Calculation 0 1 1 3 3 4 5   Recall 1 0 0 0 0 0 1  Language- name 2 objects 0 2 2 2 2 2 2   Language- repeat 1 1 1 1 1 1  0  Language- follow 3 step command 3 3 2 3 3 3 3   Language- follow 3 step command-comments - - took w/L hand, switched to R hand - - - -  Language- read & follow direction 1 1 1 1 1 1 1   Write a sentence 1 1 1 1 1 1 1   Copy design 0 1 0 1 1 1  0  Total score 11 18 17 21 21 20 25    CN II-XII intact. Bulk and tone normal, muscle strength 5/5 throughout.  Sensation to light touch  intact.  Deep tendon reflexes 2+ throughout, toes downgoing.  Finger to nose testing intact.  Gait normal, Romberg negative.  IMPRESSION: Alzheimer's dementia  PLAN: 1.  Monitor and see how she does in her new home. 2.  Follow up 3 months.  Metta Clines, DO  CC: Lajean Manes, MD

## 2020-01-02 ENCOUNTER — Ambulatory Visit (INDEPENDENT_AMBULATORY_CARE_PROVIDER_SITE_OTHER): Payer: Medicare Other | Admitting: Neurology

## 2020-01-02 ENCOUNTER — Encounter: Payer: Self-pay | Admitting: Neurology

## 2020-01-02 ENCOUNTER — Other Ambulatory Visit: Payer: Self-pay

## 2020-01-02 VITALS — BP 109/76 | HR 102 | Resp 20 | Ht 65.0 in | Wt 113.0 lb

## 2020-01-02 DIAGNOSIS — G301 Alzheimer's disease with late onset: Secondary | ICD-10-CM

## 2020-01-02 DIAGNOSIS — F0280 Dementia in other diseases classified elsewhere without behavioral disturbance: Secondary | ICD-10-CM | POA: Diagnosis not present

## 2020-01-02 NOTE — Patient Instructions (Signed)
Follow up in 3 months

## 2020-01-23 DIAGNOSIS — H47323 Drusen of optic disc, bilateral: Secondary | ICD-10-CM | POA: Diagnosis not present

## 2020-01-23 DIAGNOSIS — H43811 Vitreous degeneration, right eye: Secondary | ICD-10-CM | POA: Diagnosis not present

## 2020-01-23 DIAGNOSIS — E7800 Pure hypercholesterolemia, unspecified: Secondary | ICD-10-CM | POA: Diagnosis not present

## 2020-01-23 DIAGNOSIS — G8929 Other chronic pain: Secondary | ICD-10-CM | POA: Diagnosis not present

## 2020-01-23 DIAGNOSIS — I10 Essential (primary) hypertension: Secondary | ICD-10-CM | POA: Diagnosis not present

## 2020-01-23 DIAGNOSIS — K219 Gastro-esophageal reflux disease without esophagitis: Secondary | ICD-10-CM | POA: Diagnosis not present

## 2020-01-23 DIAGNOSIS — G301 Alzheimer's disease with late onset: Secondary | ICD-10-CM | POA: Diagnosis not present

## 2020-01-23 DIAGNOSIS — H353231 Exudative age-related macular degeneration, bilateral, with active choroidal neovascularization: Secondary | ICD-10-CM | POA: Diagnosis not present

## 2020-01-23 DIAGNOSIS — M810 Age-related osteoporosis without current pathological fracture: Secondary | ICD-10-CM | POA: Diagnosis not present

## 2020-01-23 DIAGNOSIS — I482 Chronic atrial fibrillation, unspecified: Secondary | ICD-10-CM | POA: Diagnosis not present

## 2020-02-24 DIAGNOSIS — H353221 Exudative age-related macular degeneration, left eye, with active choroidal neovascularization: Secondary | ICD-10-CM | POA: Diagnosis not present

## 2020-02-25 DIAGNOSIS — G301 Alzheimer's disease with late onset: Secondary | ICD-10-CM | POA: Diagnosis not present

## 2020-02-25 DIAGNOSIS — I10 Essential (primary) hypertension: Secondary | ICD-10-CM | POA: Diagnosis not present

## 2020-02-25 DIAGNOSIS — I482 Chronic atrial fibrillation, unspecified: Secondary | ICD-10-CM | POA: Diagnosis not present

## 2020-02-25 DIAGNOSIS — M810 Age-related osteoporosis without current pathological fracture: Secondary | ICD-10-CM | POA: Diagnosis not present

## 2020-04-12 NOTE — Progress Notes (Signed)
NEUROLOGY FOLLOW UP OFFICE NOTE  PRANAVI TOALSON [id number]   Subjective:  [name] is an 87 year old right-handed woman with paroxysmal atrial fibrillation, hypertension, CAD, polymyalgia rheumatica, depression, IBS and arthritis who follows up for Alzheimer's disease. She is accompanied by her husband who supplements history.  UPDATE: Since last visit, they have decided to move out of a private house and back to [facility].  They realized that the isolation wasn't beneficial for [name].  At [facility], she would be able to socialize with other residents and participate in many activities.  Also, there is no worry about maintenance and upkeep of a house.  They are moving tomorrow. Memory deficits mildly progressed.  She sometimes has false memories of recent events.  Sometimes she does not remember that her parents and siblings have passed away years ago.  She is not upset.   HISTORY: She is a retired Education officer, museum.She began noticing gradual memory deficits over the past 4 years.At first, she would forget appointments.She would need to use a calendar to remember to keep phone appointments with friends that she had for 6-8 years.She tended to look at the calendar 3 or 4 times a day to make sure she doesn't miss an appointment.She began misplacing objects around the house.She began forgetting recipes that she used for many years.She has lived in [place] for over 66 years.Sometimes when somebody talks to her about a specific location, she has to stop and try to remember where it is.On rare occasions, she has gotten disoriented while driving familiar routes.She cannot remember names of certain streets.She forgets names of acquaintances and it would take a while before she remembers.She enjoys reading, but often she will have to re-read chapters because she forgot the content.She belongs to a book club and has become less vocal because she cannot  remember the story as much anymore.When she is in the kitchen, she sometimes forgets where she keeps the pots or pans.She has also begun forgetting to take her medication, so her husband has to remind her.Her husband manages the finances, but she does write checks and pay some bills and has not had any difficulty with that.When she wakes up in the morning, she is not sure of the day or what her plans are for that day.She was given a diagnosis of early Alzheimer's recently, and has been anxious and scared since that time.She easily cries but she says it is due to this news and not because of depression.She denies hallucinations or delusions.She usually sleeps well.Both her mother and sister had dementia.  She also notes shakiness in her hands.When she writes or holds a utensil, her hand shakes.  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  She has cerebrovascular disease with risk factors such as atrial fibrillation, hypertension, and hyperlipidemia.  Past medications:  Namzaric (difficulty swallowing pill)  PAST MEDICAL HISTORY: Past Medical History:  Diagnosis Date  . Anxiety   . Atrophic vaginitis   . CAD (coronary artery disease) 2008/2010   Catheterization 2008, 40% LAD after first diagonal  /  nuclear January, 2010 normal  . Carotid artery disease ([place])    Doppler, July, 2011, 0-39% bilateral  . Decreased platelet count ([place]) 04/2011   142,000  . Endometrial polyp   . GERD (gastroesophageal reflux disease)   . Hyperlipidemia   .  Hypertension   . Irritable bowel syndrome with constipation   . Memory difficulty 01-20-13   "memory issues"  . Normal nuclear stress test Feb 2013   No ischemia. Normal wall motion. EF greater than 70%  .  Osteoarthritis   . Osteopenia   . Osteoporosis   . Palpitations    Monitor, May, 2013, no atrial fib,  one short run of PSVT  . Paroxysmal atrial fibrillation (HCC)    Coumadin therapy  . Polymyalgia rheumatica ([place])   . Spinal stenosis   . Stress   . Vitamin D deficiency     MEDICATIONS: Current Outpatient Medications on File Prior to Visit  Medication Sig Dispense Refill  . acetaminophen (TYLENOL) 325 MG tablet Take 650 mg by mouth every 6 (six) hours as needed.    . Cholecalciferol (VITAMIN D3) 1000 units CAPS Take 1,000 Units by mouth daily.    [name] ELIQUIS 5 MG TABS tablet Take 2.5 mg by mouth 2 (two) times daily.     [name] gabapentin (NEURONTIN) 100 MG capsule Take 1 capsule by mouth 2 (two) times daily. (Patient not taking: Reported on 01/02/2020)    . LUTEIN PO Take by mouth. Take 1 tablet by mouth every Tue, [place], Sat.    . Melatonin 1 MG TABS Take 1 tablet by mouth at bedtime as needed.     [name] NAMZARIC 28-10 MG CP24 TAKE ONE CAPSULE BY MOUTH EVERY MORNING (Patient not taking: Reported on 01/02/2020) 30 capsule 0  . traMADol (ULTRAM) 50 MG tablet Take by mouth every 8 (eight) hours as needed. (Patient not taking: Reported on 01/02/2020)    . [DISCONTINUED] estradiol (ESTRACE) 1 MG tablet Take 1 mg by mouth as needed.      No current facility-administered medications on file prior to visit.    ALLERGIES: No Known Allergies  FAMILY HISTORY: Family History  Problem Relation Age of Onset  . Transient ischemic attack Mother   . Stroke Mother   . Cancer Father        PROSTATE  . Pancreatitis Father   . Stroke Sister   . Diabetes Sister   . Hypertension Sister   . Heart disease Sister   . Dementia Sister   . Spina bifida Brother   . Hypertension Brother   . Heart disease Brother    SOCIAL HISTORY: Social History   Socioeconomic History  . Marital status: Married    Spouse name: Jeneen Rinks  . Number of children: 2  . Years of education: Masters  . Highest education level:  Not on file  Occupational History  . Occupation: Retired    Fish farm manager: RETIRED  Tobacco Use  . Smoking status: Never Smoker  . Smokeless tobacco: Never Used  Vaping Use  . Vaping Use: Never used  Substance and Sexual Activity  . Alcohol use: No  . Drug use: No  . Sexual activity: Never    Birth control/protection: Post-menopausal  Other Topics Concern  . Not on file  Social History Narrative   Patient is married Jeneen Rinks) and lives at home with her husband.   Patient has two children.   Retired; Married;   Patient is right-handed.   Patient drinks very little caffeine.   Patient has a Scientist, water quality.   Has had increased stress as her husband had a stroke ~ 3wks ago and she has been taking care of him. (04/07/11)   Social Determinants of Health   Financial Resource Strain: Not on file  Food Insecurity: Not on  file  Transportation Needs: Not on file  Physical Activity: Not on file  Stress: Not on file  Social Connections: Not on file  Intimate Partner Violence: Not on file     Objective:  Blood pressure (!) 147/85, pulse 93, height 5\' 2"  (1.575 m), weight 110 lb (49.9 kg), SpO2 98 %. General: No acute distress.  Patient appears well-groomed.      Assessment/Plan:   Major neurocognitive disorder, Alzheimer's  1.  Continue to monitor 2.  Follow up 4 months.  Metta Clines, DO  CC: Lajean Manes, MD

## 2020-04-14 ENCOUNTER — Ambulatory Visit (INDEPENDENT_AMBULATORY_CARE_PROVIDER_SITE_OTHER): Payer: Medicare Other | Admitting: Neurology

## 2020-04-14 ENCOUNTER — Other Ambulatory Visit: Payer: Self-pay

## 2020-04-14 ENCOUNTER — Encounter: Payer: Self-pay | Admitting: Neurology

## 2020-04-14 VITALS — BP 147/85 | HR 93 | Ht 62.0 in | Wt 110.0 lb

## 2020-04-14 DIAGNOSIS — G301 Alzheimer's disease with late onset: Secondary | ICD-10-CM | POA: Diagnosis not present

## 2020-04-14 DIAGNOSIS — F0280 Dementia in other diseases classified elsewhere without behavioral disturbance: Secondary | ICD-10-CM | POA: Diagnosis not present

## 2020-04-14 NOTE — Patient Instructions (Signed)
I am glad that you are moving back to [facility].  I think you will be much happier being around people and having activities to do and to not worry about maintenance and upkeep of a home.

## 2020-08-17 NOTE — Progress Notes (Deleted)
NEUROLOGY FOLLOW UP OFFICE NOTE  [name] [id number]  Assessment/Plan:   Major neurocognitive disorder due to Alzheimer's disease  1.  ***  Subjective:  [name] is an 87year old right-handed woman with paroxysmal atrial fibrillation, hypertension, CAD, polymyalgia rheumatica, depression, IBS and arthritis who follows up for Alzheimer's disease. She is accompanied by her husband who supplements history.  UPDATE: [name] and her husband are living at [facility]  Memory deficits mildly progressed.She sometimes has false memories of recent events.  Sometimes she does not remember that her parents and siblings have passed away years ago.  She is not upset.   HISTORY: She is a retired Education officer, museum.She began noticing gradual memory deficits over the past 4 years.At first, she would forget appointments.She would need to use a calendar to remember to keep phone appointments with friends that she had for 6-8 years.She tended to look at the calendar 3 or 4 times a day to make sure she doesn't miss an appointment.She began misplacing objects around the house.She began forgetting recipes that she used for many years.She has lived in [place] for over 58 years.Sometimes when somebody talks to her about a specific location, she has to stop and try to remember where it is.On rare occasions, she has gotten disoriented while driving familiar routes.She cannot remember names of certain streets.She forgets names of acquaintances and it would take a while before she remembers.She enjoys reading, but often she will have to re-read chapters because she forgot the content.She belongs to a book club and has become less vocal because she cannot remember the story as much anymore.When she is in the kitchen, she sometimes forgets where she keeps the pots or pans.She has also begun forgetting to take her medication, so her husband has to remind her.Her husband  manages the finances, but she does write checks and pay some bills and has not had any difficulty with that.When she wakes up in the morning, she is not sure of the day or what her plans are for that day.She was given a diagnosis of early Alzheimer's recently, and has been anxious and scared since that time.She easily cries but she says it is due to this news and not because of depression.She denies hallucinations or delusions.She usually sleeps well.Both her mother and sister had dementia.  She also notes shakiness in her hands.When she writes or holds a utensil, her hand shakes.  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  She has cerebrovascular disease with risk factors such as atrial fibrillation, hypertension, and hyperlipidemia.  Past medications:  Namzaric (difficulty swallowing pill)  PAST MEDICAL HISTORY: Past Medical History:  Diagnosis Date  . Anxiety   . Atrophic vaginitis   . CAD (coronary artery disease) 2008/2010   Catheterization 2008, 40% LAD after first diagonal  /  nuclear January, 2010 normal  . Carotid artery disease ([place])    Doppler, July, 2011, 0-39% bilateral  . Decreased platelet count ([place]) 04/2011   142,000  . Endometrial polyp   . GERD (gastroesophageal reflux disease)   . Hyperlipidemia   . Hypertension   . Irritable bowel syndrome with constipation   . Memory difficulty 01-20-13   "memory issues"  . Normal nuclear stress test Feb 2013   No ischemia. Normal wall motion. EF greater than 70%  . Osteoarthritis   .  Osteopenia   . Osteoporosis   . Palpitations    Monitor, May, 2013, no atrial fib,  one short run of PSVT  . Paroxysmal atrial fibrillation (HCC)    Coumadin therapy  . Polymyalgia rheumatica ([place])    . Spinal stenosis   . Stress   . Vitamin D deficiency     MEDICATIONS: Current Outpatient Medications on File Prior to Visit  Medication Sig Dispense Refill  . acetaminophen (TYLENOL) 325 MG tablet Take 650 mg by mouth every 6 (six) hours as needed. (Patient not taking: Reported on 04/14/2020)    . ELIQUIS 5 MG TABS tablet Take 2.5 mg by mouth 2 (two) times daily.     [name] gabapentin (NEURONTIN) 100 MG capsule Take 1 capsule by mouth 2 (two) times daily.    . LUTEIN PO Take by mouth. Take 1 tablet by mouth every Tue, [place], Sat.    . Melatonin 1 MG TABS Take 1 tablet by mouth at bedtime as needed.     . traMADol (ULTRAM) 50 MG tablet Take by mouth every 8 (eight) hours as needed.    . [DISCONTINUED] estradiol (ESTRACE) 1 MG tablet Take 1 mg by mouth as needed.      No current facility-administered medications on file prior to visit.    ALLERGIES: No Known Allergies  FAMILY HISTORY: Family History  Problem Relation Age of Onset  . Transient ischemic attack Mother   . Stroke Mother   . Cancer Father        PROSTATE  . Pancreatitis Father   . Stroke Sister   . Diabetes Sister   . Hypertension Sister   . Heart disease Sister   . Dementia Sister   . Spina bifida Brother   . Hypertension Brother   . Heart disease Brother       Objective:  *** General: No acute distress.  Patient appears ***-groomed.   Head:  Normocephalic/atraumatic Eyes:  Fundi examined but not visualized Neck: supple, no paraspinal tenderness, full range of motion Heart:  Regular rate and rhythm Lungs:  Clear to auscultation bilaterally Back: No paraspinal tenderness Neurological Exam: alert and oriented to person, place, and time. Attention span and concentration intact, recent and remote memory intact, fund of knowledge intact.  Speech fluent and not dysarthric, language intact.  CN II-XII intact. Bulk and tone normal, muscle strength 5/5 throughout.  Sensation to light touch, temperature and vibration  intact.  Deep tendon reflexes 2+ throughout, toes downgoing.  Finger to nose and heel to shin testing intact.  Gait normal, Romberg negative.     Metta Clines, DO  CC: ***

## 2020-08-18 ENCOUNTER — Ambulatory Visit: Payer: Medicare Other | Admitting: Neurology

## 2020-08-28 ENCOUNTER — Emergency Department (HOSPITAL_COMMUNITY): Payer: Medicare Other

## 2020-08-28 ENCOUNTER — Encounter (HOSPITAL_COMMUNITY): Payer: Self-pay

## 2020-08-28 ENCOUNTER — Emergency Department (HOSPITAL_COMMUNITY)
Admission: EM | Admit: 2020-08-28 | Discharge: 2020-08-28 | Disposition: A | Discharge status: Home / Self Care | Payer: Medicare Other | Attending: Emergency Medicine | Admitting: Emergency Medicine

## 2020-08-28 ENCOUNTER — Other Ambulatory Visit: Payer: Self-pay

## 2020-08-28 DIAGNOSIS — G309 Alzheimer's disease, unspecified: Secondary | ICD-10-CM | POA: Diagnosis not present

## 2020-08-28 DIAGNOSIS — Z79899 Other long term (current) drug therapy: Secondary | ICD-10-CM | POA: Insufficient documentation

## 2020-08-28 DIAGNOSIS — N390 Urinary tract infection, site not specified: Secondary | ICD-10-CM | POA: Insufficient documentation

## 2020-08-28 DIAGNOSIS — B9689 Other specified bacterial agents as the cause of diseases classified elsewhere: Secondary | ICD-10-CM | POA: Diagnosis not present

## 2020-08-28 DIAGNOSIS — I2510 Atherosclerotic heart disease of native coronary artery without angina pectoris: Secondary | ICD-10-CM | POA: Diagnosis not present

## 2020-08-28 DIAGNOSIS — R531 Weakness: Secondary | ICD-10-CM | POA: Insufficient documentation

## 2020-08-28 DIAGNOSIS — Z7901 Long term (current) use of anticoagulants: Secondary | ICD-10-CM | POA: Diagnosis not present

## 2020-08-28 DIAGNOSIS — I10 Essential (primary) hypertension: Secondary | ICD-10-CM | POA: Diagnosis not present

## 2020-08-28 LAB — CBC WITH DIFFERENTIAL/PLATELET
Abs Immature Granulocytes: 0.03 10*3/uL (ref 0.00–0.07)
Basophils Absolute: 0 10*3/uL (ref 0.0–0.1)
Basophils Relative: 1 %
Eosinophils Absolute: 0.1 10*3/uL (ref 0.0–0.5)
Eosinophils Relative: 1 %
HCT: 44.7 % (ref 36.0–46.0)
Hemoglobin: 14.2 g/dL (ref 12.0–15.0)
Immature Granulocytes: 0 %
Lymphocytes Relative: 15 %
Lymphs Abs: 1.3 10*3/uL (ref 0.7–4.0)
MCH: 31.5 pg (ref 26.0–34.0)
MCHC: 31.8 g/dL (ref 30.0–36.0)
MCV: 99.1 fL (ref 80.0–100.0)
Monocytes Absolute: 0.8 10*3/uL (ref 0.1–1.0)
Monocytes Relative: 10 %
Neutro Abs: 6.1 10*3/uL (ref 1.7–7.7)
Neutrophils Relative %: 73 %
Platelets: 174 10*3/uL (ref 150–400)
RBC: 4.51 MIL/uL (ref 3.87–5.11)
RDW: 12.4 % (ref 11.5–15.5)
WBC: 8.3 10*3/uL (ref 4.0–10.5)
nRBC: 0 % (ref 0.0–0.2)

## 2020-08-28 LAB — URINALYSIS, ROUTINE W REFLEX MICROSCOPIC
Bacteria, UA: NONE SEEN
Bilirubin Urine: NEGATIVE
Glucose, UA: NEGATIVE mg/dL
Hgb urine dipstick: NEGATIVE
Ketones, ur: 5 mg/dL — AB
Nitrite: NEGATIVE
Protein, ur: 30 mg/dL — AB
Specific Gravity, Urine: 1.021 (ref 1.005–1.030)
pH: 6 (ref 5.0–8.0)

## 2020-08-28 LAB — COMPREHENSIVE METABOLIC PANEL
ALT: 17 U/L (ref 0–44)
AST: 30 U/L (ref 15–41)
Albumin: 4.3 g/dL (ref 3.5–5.0)
Alkaline Phosphatase: 46 U/L (ref 38–126)
Anion gap: 7 (ref 5–15)
BUN: 18 mg/dL (ref 8–23)
CO2: 28 mmol/L (ref 22–32)
Calcium: 9.3 mg/dL (ref 8.9–10.3)
Chloride: 106 mmol/L (ref 98–111)
Creatinine, Ser: 0.74 mg/dL (ref 0.44–1.00)
GFR, Estimated: >60 mL/min (ref >60)
Glucose, Bld: 109 mg/dL — ABNORMAL HIGH (ref 70–99)
Potassium: 3.7 mmol/L (ref 3.5–5.1)
Sodium: 141 mmol/L (ref 135–145)
Total Bilirubin: 1 mg/dL (ref 0.3–1.2)
Total Protein: 7.1 g/dL (ref 6.5–8.1)

## 2020-08-28 MED ORDER — MELATONIN 3 MG PO TABS: 3.0000 mg | ORAL_TABLET | Freq: Every day | ORAL | 0 refills | Status: AC

## 2020-08-28 MED ORDER — CEPHALEXIN 250 MG PO CAPS
250.0000 mg | ORAL_CAPSULE | Freq: Once | ORAL | Status: AC
  Administered 2020-08-28: 250 mg via ORAL
  Filled 2020-08-28: qty 1

## 2020-08-28 MED ORDER — CEPHALEXIN 250 MG PO CAPS: 250.0000 mg | ORAL_CAPSULE | Freq: Four times a day (QID) | ORAL | 0 refills | Status: AC

## 2020-08-28 NOTE — ED Provider Notes (Addendum)
[facility] DEPT Provider Note   CSN: [id number] Arrival date & time: 08/28/20  1247     History Chief Complaint  Patient presents with   Extremity Weakness    Left leg    [name] is a 87 y.o. female.  HPI She presents for evaluation of limping, the right leg, which started this morning and is presently nursing during the day.  This morning her husband took her out of her nursing care facility for couple hours, and when she came back she was limping more.  Apparently during the night last night she was agitated, while she was at her facility.  She is unable to give any history.  Her husband is the primary historian.  He does not know of any trauma, recent illnesses, does not describe a change in her behavior.  There are no other known active modifying factors    Past Medical History:  Diagnosis Date   Anxiety    Atrophic vaginitis    CAD (coronary artery disease) 2008/2010   Catheterization 2008, 40% LAD after first diagonal  /  nuclear January, 2010 normal   Carotid artery disease ([place])    Doppler, July, 2011, 0-39% bilateral   Decreased platelet count ([place]) 04/2011   142,000   Endometrial polyp    GERD (gastroesophageal reflux disease)    Hyperlipidemia    Hypertension    Irritable bowel syndrome with constipation    Memory difficulty 01-20-13   "memory issues"   Normal nuclear stress test Feb 2013   No ischemia. Normal wall motion. EF greater than 70%   Osteoarthritis    Osteopenia    Osteoporosis    Palpitations    Monitor, May, 2013, no atrial fib,  one short run of PSVT   Paroxysmal atrial fibrillation (HCC)    Coumadin therapy   Polymyalgia rheumatica ([place])    Spinal stenosis    Stress    Vitamin D deficiency     Patient Active Problem List   Diagnosis Date Noted   Late onset Alzheimer's disease without behavioral disturbance ([place]) 09/07/2016   Memory loss or impairment 07/02/2013   Palpitations    Osteopenia     Spinal stenosis    Anxiety    Warfarin anticoagulation    GERD (gastroesophageal reflux disease)    Osteoporosis    Paroxysmal atrial fibrillation ([place])    Hypertension    Hyperlipidemia    Carotid artery disease ([place])    CAD (coronary artery disease)     Past Surgical History:  Procedure Laterality Date   BREAST SURGERY     BENIGN BREAST LUMP   CATARACT SURGERY  2009   COLONOSCOPY WITH PROPOFOL N/A 02/11/2013   Procedure: COLONOSCOPY WITH PROPOFOL;  Surgeon: [name], MD;  Location: [facility];  Service: Endoscopy;  Laterality: N/A;   DILATION AND CURETTAGE OF UTERUS     EXCISION MORTON'S NEUROMA  2000   HIP SURGERY Right 2005   debridement   HYSTEROSCOPY  02/07/2005   HYST W D&C, DX: PMB W SMALL ENDO POLYP-SURGEON [name]   LAMINECTOMY  March 2009   L4 through L5   TONSILLECTOMY       OB History     Gravida  4   Para  2   Term  2   Preterm      AB  2   Living  2      SAB      IAB  Ectopic      Multiple      Live Births              Family History  Problem Relation Age of Onset   Transient ischemic attack Mother    Stroke Mother    Cancer Father        PROSTATE   Pancreatitis Father    Stroke Sister    Diabetes Sister    Hypertension Sister    Heart disease Sister    Dementia Sister    Spina bifida Brother    Hypertension Brother    Heart disease Brother     Social History   Tobacco Use   Smoking status: Never   Smokeless tobacco: Never  Vaping Use   Vaping Use: Never used  Substance Use Topics   Alcohol use: No   Drug use: No    Home Medications Prior to Admission medications   Medication Sig Start Date End Date Taking? Authorizing Provider  cephALEXin (KEFLEX) 250 MG capsule Take 1 capsule (250 mg total) by mouth 4 (four) times daily. 08/28/20  Yes Daleen Bo, MD  acetaminophen (TYLENOL) 325 MG tablet Take 650 mg by mouth every 6 (six) hours as needed. Patient not taking: Reported on 04/14/2020     [provider]  ELIQUIS 5 MG TABS tablet Take 2.5 mg by mouth 2 (two) times daily.  12/27/17   [provider]  gabapentin (NEURONTIN) 100 MG capsule Take 1 capsule by mouth 2 (two) times daily. 06/03/19   [provider]  LUTEIN PO Take by mouth. Take 1 tablet by mouth every Tue, [place], Sat.    [provider]  Melatonin 1 MG TABS Take 1 tablet by mouth at bedtime as needed.     [provider]  traMADol (ULTRAM) 50 MG tablet Take by mouth every 8 (eight) hours as needed.    [provider]  estradiol (ESTRACE) 1 MG tablet Take 1 mg by mouth as needed.   03/16/11  [provider]    Allergies    Patient has no known allergies.  Review of Systems   Review of Systems  All other systems reviewed and are negative.  Physical Exam Updated Vital Signs BP 137/83 (BP Location: Right Arm)   Pulse 97   Temp 98.7 F (37.1 C) (Oral)   Resp 18   SpO2 97%   Physical Exam Vitals and nursing note reviewed.  Constitutional:      General: She is not in acute distress.    Appearance: Normal appearance. She is well-developed.  HENT:     Head: Normocephalic and atraumatic.     Right Ear: External ear normal.     Left Ear: External ear normal.  Eyes:     Conjunctiva/sclera: Conjunctivae normal.     Pupils: Pupils are equal, round, and reactive to light.  Neck:     Trachea: Phonation normal.  Cardiovascular:     Rate and Rhythm: Normal rate and regular rhythm.     Heart sounds: Normal heart sounds.  Pulmonary:     Effort: Pulmonary effort is normal.     Breath sounds: Normal breath sounds.  Abdominal:     Palpations: Abdomen is soft.     Tenderness: There is no abdominal tenderness.  Musculoskeletal:        General: Normal range of motion.     Cervical back: Normal range of motion and neck supple.  Skin:    General: Skin is warm  and dry.  Neurological:     Mental Status: She is alert.     Cranial Nerves: No cranial nerve  deficit.     Motor: No abnormal muscle tone.     Coordination: Coordination normal.     Comments: No dysarthria or aphasia.  Normal strength arms and legs bilaterally.  She is able to ambulate but occasionally just to the right, while walking, more noticeable when turning.  No ataxia.  Psychiatric:        Mood and Affect: Mood normal.        Behavior: Behavior normal.     Comments: She is alert and responsive.  She is confused.    ED Results / Procedures / Treatments   Labs (all labs ordered are listed, but only abnormal results are displayed) Labs Reviewed  COMPREHENSIVE METABOLIC PANEL - Abnormal; Notable for the following components:      Result Value   Glucose, Bld 109 (*)    All other components within normal limits  URINALYSIS, ROUTINE W REFLEX MICROSCOPIC - Abnormal; Notable for the following components:   Ketones, ur 5 (*)    Protein, ur 30 (*)    Leukocytes,Ua MODERATE (*)    All other components within normal limits  URINE CULTURE  CBC WITH DIFFERENTIAL/PLATELET    EKG None  Radiology DG Chest 2 View  Result Date: 08/28/2020 CLINICAL DATA:  Agitation EXAM: CHEST - 2 VIEW COMPARISON:  07/08/2018 FINDINGS: Stable cardiomediastinal contours. Atherosclerotic calcification of the aortic knob. Mildly hyperexpanded lungs. No focal airspace consolidation, pleural effusion, or pneumothorax. No acute osseous findings. IMPRESSION: No active cardiopulmonary disease. Electronically Signed   By: Davina Poke D.O.   On: 08/28/2020 16:05   CT Head Wo Contrast  Result Date: 08/28/2020 CLINICAL DATA:  Nonspecific dizziness.  Altered mental status. EXAM: CT HEAD WITHOUT CONTRAST TECHNIQUE: Contiguous axial images were obtained from the base of the skull through the vertex without intravenous contrast. COMPARISON:  None available. FINDINGS: Brain: Defied exam obtained due to patient tolerance. Generalized atrophy. Moderate periventricular and deep chronic small vessel ischemia. No  hemorrhage or evidence of acute infarct. No midline shift or mass effect. No subdural or extra-axial collection. Vascular: No hyperdense vessel Skull: No fracture or focal lesion. Sinuses/Orbits: Paranasal sinuses are clear. Trace opacification of lower left mastoid air cells. No significant mastoid effusion. The visualized orbits are unremarkable. Bilateral lens extraction. Other: None. IMPRESSION: 1. No acute intracranial abnormality. 2. Generalized atrophy and chronic small vessel ischemia. Electronically Signed   By: Keith Rake M.D.   On: 08/28/2020 17:10    Procedures Procedures   Medications Ordered in ED Medications  cephALEXin (KEFLEX) capsule 250 mg (has no administration in time range)    ED Course  I have reviewed the triage vital signs and the nursing notes.  Pertinent labs & imaging results that were available during my care of the patient were reviewed by me and considered in my medical decision making (see chart for details).    MDM Rules/Calculators/A&P                           Patient Vitals for the past 24 hrs:  BP Temp Temp src Pulse Resp SpO2  08/28/20 1525 137/83 98.7 F (37.1 C) Oral 97 18 97 %  08/28/20 1350 129/88 98.6 F (37 C) Oral 98 18 99 %    5:58 PM Reevaluation with update and discussion. After initial assessment and treatment, an  updated evaluation reveals patient is stable.  No change in status.  Patient's daughter is here now and gives additional history.  She states that the patient typically overdoes it, and that since moving to a new memory care unit, about 9 days ago she has been agitated.  Patient typically walks slowly and does not require assistance.  We discussed the findings, and the plan.  Daughter is agreeable.Daleen Bo   Medical Decision Making:  This patient is presenting for evaluation of difficulty walking with a limp, which does require a range of treatment options, and is a complaint that involves a high risk of morbidity  and mortality. The differential diagnoses include CVA, metabolic disorder, occult infection. I decided to review old records, and in summary [name] female, presenting from her nursing care facility after it was noticed that she is having gradually worse trouble walking today.  She seems to be limping on the right, and is unable to give any history of present illness.  She lives in a nursing care facility.  She is on Eliquis anticoagulation, for atrial fibrillation. I received additional historical information from husband at bedside.  Clinical Laboratory Tests Ordered, included CBC, Metabolic panel, and Urinalysis. Review indicates normal except glucose slightly elevated, urinalysis abnormal.  Urine culture ordered since patient is weak and has agitation.  It is felt that these could be explained by a urinary tract infection.      Critical Interventions-clinical evaluation, laboratory testing,  After These Interventions, the Patient was reevaluated and was found stable for discharge with outpatient management for UTI.  Patient has newly arrived at a memory care unit and was agitated last night, which is likely contributing to weakness.  She is hemodynamically stable.  Doubt sepsis or metabolic disorder.  CRITICAL CARE-no Performed by: Daleen Bo  Nursing Notes Reviewed/ Care Coordinated Applicable Imaging Reviewed Interpretation of Laboratory Data incorporated into ED treatment  The patient appears reasonably screened and/or stabilized for discharge and I doubt any other medical condition or other [facility] requiring further screening, evaluation, or treatment in the ED at this time prior to discharge.  Plan: Home Medications-continue usual; Home Treatments-rest, fluid; return here if the recommended treatment, does not improve the symptoms; Recommended follow up-PCP 1 week and PRN     Final Clinical Impression(s) / ED Diagnoses Final diagnoses:  Lower urinary tract infectious disease   Weakness    Rx / DC Orders ED Discharge Orders          Ordered    cephALEXin (KEFLEX) 250 MG capsule  4 times daily        08/28/20 1800             Daleen Bo, MD 08/28/20 1804  After discharge I was called to patient's bedside.  Patient's daughter requested medication for sleep.  She has tried to contact the PCP but they are unavailable because we can.  I agreed to give a very short-term prescription of melatonin to help with sleep.  AVS printed.    Daleen Bo, MD 08/28/20 [phone]

## 2020-08-28 NOTE — ED Triage Notes (Signed)
EMS called for left leg pain and leaning to the right side. This started today at 1130 after being dropped off at facility by husband.  Pt was walking around agitated up all night. Husband came to get patient at facility this morning at 0930 and return at 1130 with complaints of left leg pain and right sided lean. Per EMS stroke screen negative.   Pt baseline is Aox3 per EMS.   BP 160/82, GBS 114, GCS 15, RR 16, RA 98%

## 2020-08-28 NOTE — Discharge Instructions (Addendum)
The testing today indicates that she probably has urinary tract infection.  We are starting an antibiotic prescription to treat that.  Encouraged her to get plenty of rest, and drink a lot of fluids.  Urine culture results will be available in 2 to 3 days.  Follow-up with her PCP for ongoing care and management, as needed and for repeat urine test in about a week and a half.

## 2020-11-13 ENCOUNTER — Encounter (HOSPITAL_COMMUNITY): Payer: Self-pay

## 2020-11-13 ENCOUNTER — Emergency Department (HOSPITAL_COMMUNITY)
Admission: EM | Admit: 2020-11-13 | Discharge: 2020-11-13 | Disposition: A | Discharge status: Home / Self Care | Payer: Medicare Other | Attending: Emergency Medicine | Admitting: Emergency Medicine

## 2020-11-13 DIAGNOSIS — Z7901 Long term (current) use of anticoagulants: Secondary | ICD-10-CM | POA: Insufficient documentation

## 2020-11-13 DIAGNOSIS — R112 Nausea with vomiting, unspecified: Secondary | ICD-10-CM | POA: Insufficient documentation

## 2020-11-13 DIAGNOSIS — I480 Paroxysmal atrial fibrillation: Secondary | ICD-10-CM | POA: Diagnosis not present

## 2020-11-13 DIAGNOSIS — I447 Left bundle-branch block, unspecified: Secondary | ICD-10-CM | POA: Insufficient documentation

## 2020-11-13 DIAGNOSIS — E860 Dehydration: Secondary | ICD-10-CM | POA: Diagnosis not present

## 2020-11-13 DIAGNOSIS — I10 Essential (primary) hypertension: Secondary | ICD-10-CM | POA: Diagnosis not present

## 2020-11-13 DIAGNOSIS — I2510 Atherosclerotic heart disease of native coronary artery without angina pectoris: Secondary | ICD-10-CM | POA: Diagnosis not present

## 2020-11-13 DIAGNOSIS — G309 Alzheimer's disease, unspecified: Secondary | ICD-10-CM | POA: Insufficient documentation

## 2020-11-13 DIAGNOSIS — R109 Unspecified abdominal pain: Secondary | ICD-10-CM | POA: Insufficient documentation

## 2020-11-13 LAB — CBC
HCT: 48.5 % — ABNORMAL HIGH (ref 36.0–46.0)
Hemoglobin: 15.5 g/dL — ABNORMAL HIGH (ref 12.0–15.0)
MCH: 30.9 pg (ref 26.0–34.0)
MCHC: 32 g/dL (ref 30.0–36.0)
MCV: 96.6 fL (ref 80.0–100.0)
Platelets: 182 10*3/uL (ref 150–400)
RBC: 5.02 MIL/uL (ref 3.87–5.11)
RDW: 13 % (ref 11.5–15.5)
WBC: 7.6 10*3/uL (ref 4.0–10.5)
nRBC: 0 % (ref 0.0–0.2)

## 2020-11-13 LAB — CBG MONITORING, ED: Glucose-Capillary: 102 mg/dL — ABNORMAL HIGH (ref 70–99)

## 2020-11-13 LAB — BASIC METABOLIC PANEL
Anion gap: 10 (ref 5–15)
BUN: 26 mg/dL — ABNORMAL HIGH (ref 8–23)
CO2: 27 mmol/L (ref 22–32)
Calcium: 9.4 mg/dL (ref 8.9–10.3)
Chloride: 104 mmol/L (ref 98–111)
Creatinine, Ser: 0.96 mg/dL (ref 0.44–1.00)
GFR, Estimated: 58 mL/min — ABNORMAL LOW (ref >60)
Glucose, Bld: 107 mg/dL — ABNORMAL HIGH (ref 70–99)
Potassium: 3.4 mmol/L — ABNORMAL LOW (ref 3.5–5.1)
Sodium: 141 mmol/L (ref 135–145)

## 2020-11-13 NOTE — Discharge Instructions (Addendum)
[name] was seen in the ER for nausea, vomiting and low blood pressure.  She had no episode of emesis here. Her blood pressure is stable. Her blood work is not revealing any severe anemia or organ dysfunction.  Her EKG did have a new left bundle branch block.  Unsure significance. It might be a good idea to have her get an echocardiogram.  Provided is the contact information for cardiology service if you are unable to order an echocardiogram.

## 2020-11-13 NOTE — ED Notes (Signed)
Unable to sign MSE d/t advanced dementia

## 2020-11-13 NOTE — ED Triage Notes (Signed)
Pt BIB [facility] from [facility] for eval of episode of hypotension 96SBP this AM, EMS reports normotensive, state unable to obtain O2 sat en route

## 2020-11-13 NOTE — ED Provider Notes (Signed)
Emergency Medicine Provider Triage Evaluation Note  [name] , a 87 y.o. female  was evaluated in triage.  Pt complains of weakness.  Review of Systems  Positive: Weakness, hypotension Negative: Level V caveats  Physical Exam  Ht '5\' 2"'$  (1.575 m)   Wt 50 kg   BMI 20.16 kg/m  Gen:   Awake, no distress   Resp:  Normal effort  MSK:   Moves extremities without difficulty  Other:    Medical Decision Making  Medically screening exam initiated at 4:28 PM.  Appropriate orders placed.  [name] was informed that the remainder of the evaluation will be completed by another provider, this initial triage assessment does not replace that evaluation, and the importance of remaining in the ED until their evaluation is complete.  Pt with hx of dementia sent here from nursing home due to having BP in the 0000000 systolic earlier today.  Pt without complaint, but is demented.  VSS.   Domenic Moras, PA-C 11/13/20 [place], Ankit, MD 11/14/20 1135

## 2020-11-13 NOTE — ED Provider Notes (Signed)
[facility] EMERGENCY DEPARTMENT Provider Note   CSN: [id number] Arrival date & time: 11/13/20  1606     History Chief Complaint  Patient presents with   Hypotension    [name] is a 87 y.o. female.  HPI    87 year old female with history of CAD, hypertension, hyperlipidemia, A. fib comes been a chief complaint of low blood pressure.  Level 5 caveat for severe dementia.  Patient's daughter is at the bedside.  Allegedly, patient was having nausea, vomiting and complained of abdominal pain at the nursing home.  At that time her blood pressure was also low, therefore she was advised to come to the ER.  While in the ER, patient has not had any vomiting.  She has no complaints from her side.  She denies chest pains  Past Medical History:  Diagnosis Date   Anxiety    Atrophic vaginitis    CAD (coronary artery disease) 2008/2010   Catheterization 2008, 40% LAD after first diagonal  /  nuclear January, 2010 normal   Carotid artery disease ([place])    Doppler, July, 2011, 0-39% bilateral   Decreased platelet count ([place]) 04/2011   142,000   Endometrial polyp    GERD (gastroesophageal reflux disease)    Hyperlipidemia    Hypertension    Irritable bowel syndrome with constipation    Memory difficulty 01-20-13   "memory issues"   Normal nuclear stress test Feb 2013   No ischemia. Normal wall motion. EF greater than 70%   Osteoarthritis    Osteopenia    Osteoporosis    Palpitations    Monitor, May, 2013, no atrial fib,  one short run of PSVT   Paroxysmal atrial fibrillation (HCC)    Coumadin therapy   Polymyalgia rheumatica ([place])    Spinal stenosis    Stress    Vitamin D deficiency     Patient Active Problem List   Diagnosis Date Noted   Late onset Alzheimer's disease without behavioral disturbance ([place]) 09/07/2016   Memory loss or impairment 07/02/2013   Palpitations    Osteopenia    Spinal stenosis    Anxiety    Warfarin anticoagulation     GERD (gastroesophageal reflux disease)    Osteoporosis    Paroxysmal atrial fibrillation ([place])    Hypertension    Hyperlipidemia    Carotid artery disease ([place])    CAD (coronary artery disease)     Past Surgical History:  Procedure Laterality Date   BREAST SURGERY     BENIGN BREAST LUMP   CATARACT SURGERY  2009   COLONOSCOPY WITH PROPOFOL N/A 02/11/2013   Procedure: COLONOSCOPY WITH PROPOFOL;  Surgeon: [name], MD;  Location: [facility];  Service: Endoscopy;  Laterality: N/A;   DILATION AND CURETTAGE OF UTERUS     EXCISION MORTON'S NEUROMA  2000   HIP SURGERY Right 2005   debridement   HYSTEROSCOPY  02/07/2005   HYST W D&C, DX: PMB W SMALL ENDO POLYP-SURGEON [name]   LAMINECTOMY  March 2009   L4 through L5   TONSILLECTOMY       OB History     Gravida  4   Para  2   Term  2   Preterm      AB  2   Living  2      SAB      IAB      Ectopic      Multiple      Live Births  Family History  Problem Relation Age of Onset   Transient ischemic attack Mother    Stroke Mother    Cancer Father        PROSTATE   Pancreatitis Father    Stroke Sister    Diabetes Sister    Hypertension Sister    Heart disease Sister    Dementia Sister    Spina bifida Brother    Hypertension Brother    Heart disease Brother     Social History   Tobacco Use   Smoking status: Never   Smokeless tobacco: Never  Vaping Use   Vaping Use: Never used  Substance Use Topics   Alcohol use: No   Drug use: No    Home Medications Prior to Admission medications   Medication Sig Start Date End Date Taking? Authorizing Provider  acetaminophen (TYLENOL) 325 MG tablet Take 650 mg by mouth every 6 (six) hours as needed. Patient not taking: Reported on 04/14/2020    [provider]  cephALEXin (KEFLEX) 250 MG capsule Take 1 capsule (250 mg total) by mouth 4 (four) times daily. 08/28/20   Daleen Bo, MD  ELIQUIS 5 MG TABS tablet Take 2.5 mg by  mouth 2 (two) times daily.  12/27/17   [provider]  gabapentin (NEURONTIN) 100 MG capsule Take 1 capsule by mouth 2 (two) times daily. 06/03/19   [provider]  LUTEIN PO Take by mouth. Take 1 tablet by mouth every Tue, [place], Sat.    [provider]  melatonin 3 MG TABS tablet Take 1 tablet (3 mg total) by mouth at bedtime. 08/28/20   Daleen Bo, MD  traMADol (ULTRAM) 50 MG tablet Take by mouth every 8 (eight) hours as needed.    [provider]  estradiol (ESTRACE) 1 MG tablet Take 1 mg by mouth as needed.   03/16/11  [provider]    Allergies    Patient has no known allergies.  Review of Systems   Review of Systems  All other systems reviewed and are negative.  Physical Exam Updated Vital Signs BP 137/87 (BP Location: Right Arm)   Pulse 90   Temp 98.1 F (36.7 C) (Oral)   Resp 16   Ht '5\' 2"'$  (1.575 m)   Wt 50 kg   SpO2 100%   BMI 20.16 kg/m   Physical Exam  ED Results / Procedures / Treatments   Labs (all labs ordered are listed, but only abnormal results are displayed) Labs Reviewed  BASIC METABOLIC PANEL - Abnormal; Notable for the following components:      Result Value   Potassium 3.4 (*)    Glucose, Bld 107 (*)    BUN 26 (*)    GFR, Estimated 58 (*)    All other components within normal limits  CBC - Abnormal; Notable for the following components:   Hemoglobin 15.5 (*)    HCT 48.5 (*)    All other components within normal limits  CBG MONITORING, ED - Abnormal; Notable for the following components:   Glucose-Capillary 102 (*)    All other components within normal limits  URINALYSIS, ROUTINE W REFLEX MICROSCOPIC    EKG EKG Interpretation  Date/Time:  Saturday November 13 2020 16:43:37 EDT Ventricular Rate:  106 PR Interval:    QRS Duration: 126 QT Interval:  326 QTC Calculation: 433 R Axis:   -32 Text Interpretation: Atrial fibrillation with rapid ventricular response Left axis deviation Left  bundle branch block Abnormal ECG Confirmed by Roslynn Amble,  Richard [phone]) on 11/13/2020 4:47:10 PM ED ECG REPORT   Date: 11/13/2020  Rate: 84  Rhythm: normal sinus rhythm  QRS Axis: left  Intervals: normal  ST/T Wave abnormalities: nonspecific ST/T changes  Conduction Disutrbances:left bundle branch block  Narrative Interpretation:   Old EKG Reviewed: unchanged  I have personally reviewed the EKG tracing and agree with the computerized printout as noted.    Radiology No results found.  Procedures Procedures   Medications Ordered in ED Medications - No data to display  ED Course  I have reviewed the triage vital signs and the nursing notes.  Pertinent labs & imaging results that were available during my care of the patient were reviewed by me and considered in my medical decision making (see chart for details).    MDM Rules/Calculators/A&P                           87 year old female comes in with chief complaint of low blood pressure, nausea and vomiting.  She also allegedly reported some abdominal pain  During our assessment, patient has never been hypotensive.  She also had no abdominal tenderness on 2 separate exams   Labs do not reveal any significant dehydration.  She has elevated BUN to creatinine ratio, suggesting probably mild dehydration.  There is no history of recurrent UTI.  UA ordered, but if we do not get urine analysis, and this acute event we do not think catheterization is needed for  Patient is EKG has new left bundle branch block.  She has no chest pains.  Repeat EKG does not reveal any acute dynamic changes, therefore I doubt that she also had nausea and vomiting secondary to MI.  No need for troponins.  Final Clinical Impression(s) / ED Diagnoses Final diagnoses:  Dehydration  Left bundle branch block (LBBB)    Rx / DC Orders ED Discharge Orders     None        Varney Biles, MD 11/13/20 2022

## 2020-12-25 ENCOUNTER — Encounter (HOSPITAL_COMMUNITY): Payer: Self-pay

## 2020-12-25 ENCOUNTER — Emergency Department (HOSPITAL_COMMUNITY): Payer: Medicare Other

## 2020-12-25 ENCOUNTER — Other Ambulatory Visit: Payer: Self-pay

## 2020-12-25 ENCOUNTER — Emergency Department (HOSPITAL_COMMUNITY)
Admission: EM | Admit: 2020-12-25 | Discharge: 2020-12-26 | Disposition: A | Discharge status: Home / Self Care | Payer: Medicare Other | Attending: Emergency Medicine | Admitting: Emergency Medicine

## 2020-12-25 DIAGNOSIS — Z043 Encounter for examination and observation following other accident: Secondary | ICD-10-CM | POA: Insufficient documentation

## 2020-12-25 DIAGNOSIS — W01198A Fall on same level from slipping, tripping and stumbling with subsequent striking against other object, initial encounter: Secondary | ICD-10-CM | POA: Insufficient documentation

## 2020-12-25 DIAGNOSIS — G301 Alzheimer's disease with late onset: Secondary | ICD-10-CM | POA: Diagnosis not present

## 2020-12-25 DIAGNOSIS — I10 Essential (primary) hypertension: Secondary | ICD-10-CM | POA: Insufficient documentation

## 2020-12-25 DIAGNOSIS — R410 Disorientation, unspecified: Secondary | ICD-10-CM | POA: Insufficient documentation

## 2020-12-25 DIAGNOSIS — Z7901 Long term (current) use of anticoagulants: Secondary | ICD-10-CM | POA: Insufficient documentation

## 2020-12-25 DIAGNOSIS — I2510 Atherosclerotic heart disease of native coronary artery without angina pectoris: Secondary | ICD-10-CM | POA: Diagnosis not present

## 2020-12-25 DIAGNOSIS — W19XXXA Unspecified fall, initial encounter: Secondary | ICD-10-CM

## 2020-12-25 DIAGNOSIS — I480 Paroxysmal atrial fibrillation: Secondary | ICD-10-CM | POA: Insufficient documentation

## 2020-12-25 LAB — COMPREHENSIVE METABOLIC PANEL
ALT: 14 U/L (ref 0–44)
AST: 20 U/L (ref 15–41)
Albumin: 3.3 g/dL — ABNORMAL LOW (ref 3.5–5.0)
Alkaline Phosphatase: 39 U/L (ref 38–126)
Anion gap: 11 (ref 5–15)
BUN: 18 mg/dL (ref 8–23)
CO2: 21 mmol/L — ABNORMAL LOW (ref 22–32)
Calcium: 8.7 mg/dL — ABNORMAL LOW (ref 8.9–10.3)
Chloride: 109 mmol/L (ref 98–111)
Creatinine, Ser: 0.64 mg/dL (ref 0.44–1.00)
GFR, Estimated: >60 mL/min (ref >60)
Glucose, Bld: 108 mg/dL — ABNORMAL HIGH (ref 70–99)
Potassium: 3.7 mmol/L (ref 3.5–5.1)
Sodium: 141 mmol/L (ref 135–145)
Total Bilirubin: 0.8 mg/dL (ref 0.3–1.2)
Total Protein: 5.8 g/dL — ABNORMAL LOW (ref 6.5–8.1)

## 2020-12-25 LAB — CBC
HCT: 42.3 % (ref 36.0–46.0)
Hemoglobin: 13.5 g/dL (ref 12.0–15.0)
MCH: 30.8 pg (ref 26.0–34.0)
MCHC: 31.9 g/dL (ref 30.0–36.0)
MCV: 96.4 fL (ref 80.0–100.0)
Platelets: 112 10*3/uL — ABNORMAL LOW (ref 150–400)
RBC: 4.39 MIL/uL (ref 3.87–5.11)
RDW: 13.7 % (ref 11.5–15.5)
WBC: 5.8 10*3/uL (ref 4.0–10.5)
nRBC: 0 % (ref 0.0–0.2)

## 2020-12-25 LAB — URINALYSIS, ROUTINE W REFLEX MICROSCOPIC
Bilirubin Urine: NEGATIVE
Glucose, UA: NEGATIVE mg/dL
Hgb urine dipstick: NEGATIVE
Ketones, ur: 20 mg/dL — AB
Leukocytes,Ua: NEGATIVE
Nitrite: NEGATIVE
Protein, ur: NEGATIVE mg/dL
Specific Gravity, Urine: 1.016 (ref 1.005–1.030)
pH: 8 (ref 5.0–8.0)

## 2020-12-25 LAB — VALPROIC ACID LEVEL: Valproic Acid Lvl: 92 ug/mL (ref 50.0–100.0)

## 2020-12-25 NOTE — ED Notes (Signed)
POA/daughter  Suanne Marker [phone] would like an update and to inform us that she has severe dementia

## 2020-12-25 NOTE — ED Notes (Signed)
..  Trauma Response Nurse Note-  Reason for Call / Reason for Trauma activation:  Level 2 activation- fall on thinners.  Initial Focused Assessment (If applicable, or please see trauma documentation):  To ED via [place] from [facility] care unit- staff stated that pt was more sluggish than normal and had a fall. Pt has dementia, is unable to answer any questions, attempts to get out of bed, but cooperative and minimally redirectable. No obvious injuries- does have a bruise to left elbow, reddish in color.   Interventions:  CT scans Xrays Labs  Plan of Care as of this note:  Waiting results  Rolene Arbour, RN Trauma Response Nurse [phone]

## 2020-12-25 NOTE — ED Notes (Signed)
Pt has been encouraged to stay in bed several times already, non skid socks applied, bed rails are up & she does have on a fall risk bracelet.

## 2020-12-25 NOTE — ED Notes (Addendum)
X-ray at bedside

## 2020-12-25 NOTE — ED Notes (Signed)
Patient transported to CT with Trauma RN with her.

## 2020-12-25 NOTE — ED Provider Notes (Addendum)
[facility] EMERGENCY DEPARTMENT Provider Note   CSN: [id number] Arrival date & time: 12/25/20  1821     History Chief Complaint  Patient presents with   Fall/thinners   Level 2    [name] is a 87 y.o. female. Level 5 caveat due to dementia. HPI Patient presents as a level 2 trauma.  Comes from memory care unit.  Reportedly fell and hit her head.  Patient really cannot provide history.  Reportedly reassuring vitals.  Reportedly on Eliquis for atrial fibrillation.  Reportedly has been more sleepy today although reportedly just had an increase in her Ativan.    Past Medical History:  Diagnosis Date   Anxiety    Atrophic vaginitis    CAD (coronary artery disease) 2008/2010   Catheterization 2008, 40% LAD after first diagonal  /  nuclear January, 2010 normal   Carotid artery disease ([place])    Doppler, July, 2011, 0-39% bilateral   Decreased platelet count ([place]) 04/2011   142,000   Endometrial polyp    GERD (gastroesophageal reflux disease)    Hyperlipidemia    Hypertension    Irritable bowel syndrome with constipation    Memory difficulty 01-20-13   "memory issues"   Normal nuclear stress test Feb 2013   No ischemia. Normal wall motion. EF greater than 70%   Osteoarthritis    Osteopenia    Osteoporosis    Palpitations    Monitor, May, 2013, no atrial fib,  one short run of PSVT   Paroxysmal atrial fibrillation (HCC)    Coumadin therapy   Polymyalgia rheumatica ([place])    Spinal stenosis    Stress    Vitamin D deficiency     Patient Active Problem List   Diagnosis Date Noted   Late onset Alzheimer's disease without behavioral disturbance ([place]) 09/07/2016   Memory loss or impairment 07/02/2013   Palpitations    Osteopenia    Spinal stenosis    Anxiety    Warfarin anticoagulation    GERD (gastroesophageal reflux disease)    Osteoporosis    Paroxysmal atrial fibrillation ([place])    Hypertension    Hyperlipidemia    Carotid artery  disease ([place])    CAD (coronary artery disease)     Past Surgical History:  Procedure Laterality Date   BREAST SURGERY     BENIGN BREAST LUMP   CATARACT SURGERY  2009   COLONOSCOPY WITH PROPOFOL N/A 02/11/2013   Procedure: COLONOSCOPY WITH PROPOFOL;  Surgeon: [name], MD;  Location: [facility];  Service: Endoscopy;  Laterality: N/A;   DILATION AND CURETTAGE OF UTERUS     EXCISION MORTON'S NEUROMA  2000   HIP SURGERY Right 2005   debridement   HYSTEROSCOPY  02/07/2005   HYST W D&C, DX: PMB W SMALL ENDO POLYP-SURGEON [name]   LAMINECTOMY  March 2009   L4 through L5   TONSILLECTOMY       OB History     Gravida  4   Para  2   Term  2   Preterm      AB  2   Living  2      SAB      IAB      Ectopic      Multiple      Live Births              Family History  Problem Relation Age of Onset   Transient ischemic attack Mother    Stroke Mother  Cancer Father        PROSTATE   Pancreatitis Father    Stroke Sister    Diabetes Sister    Hypertension Sister    Heart disease Sister    Dementia Sister    Spina bifida Brother    Hypertension Brother    Heart disease Brother     Social History   Tobacco Use   Smoking status: Never   Smokeless tobacco: Never  Vaping Use   Vaping Use: Never used  Substance Use Topics   Alcohol use: No   Drug use: No    Home Medications Prior to Admission medications   Medication Sig Start Date End Date Taking? Authorizing Provider  acetaminophen (TYLENOL) 325 MG tablet Take 650 mg by mouth every 6 (six) hours as needed. Patient not taking: Reported on 04/14/2020    [provider]  cephALEXin (KEFLEX) 250 MG capsule Take 1 capsule (250 mg total) by mouth 4 (four) times daily. 08/28/20   Daleen Bo, MD  ELIQUIS 5 MG TABS tablet Take 2.5 mg by mouth 2 (two) times daily.  12/27/17   [provider]  gabapentin (NEURONTIN) 100 MG capsule Take 1 capsule by mouth 2 (two) times daily.  06/03/19   [provider]  LUTEIN PO Take by mouth. Take 1 tablet by mouth every Tue, [place], Sat.    [provider]  melatonin 3 MG TABS tablet Take 1 tablet (3 mg total) by mouth at bedtime. 08/28/20   Daleen Bo, MD  traMADol (ULTRAM) 50 MG tablet Take by mouth every 8 (eight) hours as needed.    [provider]  estradiol (ESTRACE) 1 MG tablet Take 1 mg by mouth as needed.   03/16/11  [provider]    Allergies    Patient has no known allergies.  Review of Systems   Review of Systems  Unable to perform ROS: Dementia   Physical Exam Updated Vital Signs BP (!) 134/49   Pulse 98   Temp (!) 97.4 F (36.3 C) (Axillary)   Resp 20   Ht 5\' 2"  (1.575 m)   Wt 50 kg   SpO2 100%   BMI 20.16 kg/m   Physical Exam Vitals reviewed.  HENT:     Head: Atraumatic.  Eyes:     Extraocular Movements: Extraocular movements intact.     Pupils: Pupils are equal, round, and reactive to light.  Cardiovascular:     Rate and Rhythm: Regular rhythm.  Pulmonary:     Breath sounds: No wheezing or rhonchi.  Abdominal:     Tenderness: There is no abdominal tenderness.  Musculoskeletal:        General: No tenderness.     Cervical back: Neck supple. No tenderness.     Comments: Good range of motion upper and lower extremities.  No clear tenderness  Skin:    General: Skin is warm.     Capillary Refill: Capillary refill takes less than 2 seconds.  Neurological:     Mental Status: She is alert.     Comments: Reportedly more sedate than her baseline.  Awake but some baseline confusion.  Really cannot provide history.  Moving all extremities    ED Results / Procedures / Treatments   Labs (all labs ordered are listed, but only abnormal results are displayed) Labs Reviewed  COMPREHENSIVE METABOLIC PANEL  CBC  URINALYSIS, ROUTINE W REFLEX MICROSCOPIC  VALPROIC ACID LEVEL    EKG None  Radiology CT HEAD WO CONTRAST (5MM)  Result Date:  12/25/2020 CLINICAL DATA:  Recent head trauma EXAM: CT HEAD WITHOUT CONTRAST TECHNIQUE: Contiguous axial images were obtained from the base of the skull through the vertex without intravenous contrast. COMPARISON:  08/28/2020 FINDINGS: Brain: No evidence of acute infarction, hemorrhage, hydrocephalus, extra-axial collection or mass lesion/mass effect. Chronic atrophic and ischemic changes are noted. These are stable in appearance from the prior exam. Vascular: No hyperdense vessel or unexpected calcification. Skull: Normal. Negative for fracture or focal lesion. Sinuses/Orbits: No acute finding. Other: None. IMPRESSION: Chronic atrophic and ischemic changes without acute abnormality. Electronically Signed   By: Inez Catalina M.D.   On: 12/25/2020 19:53   CT Cervical Spine Wo Contrast  Result Date: 12/25/2020 CLINICAL DATA:  Recent neck trauma with pain, initial encounter EXAM: CT CERVICAL SPINE WITHOUT CONTRAST TECHNIQUE: Multidetector CT imaging of the cervical spine was performed without intravenous contrast. Multiplanar CT image reconstructions were also generated. COMPARISON:  None. FINDINGS: Alignment: Images are somewhat limited by patient motion artifact. Cervical alignment is within normal limits given these limitations. Skull base and vertebrae: 7 cervical segments are well visualized. Vertebral body height is well maintained. No acute fracture or acute facet abnormality is noted. Multilevel facet hypertrophic changes and mild osteophytic changes are seen. Soft tissues and spinal canal: Surrounding soft tissue structures are within normal limits. Upper chest: Visualized lung apices show emphysematous change without focal infiltrate. Other: None IMPRESSION: Multilevel degenerative change without acute abnormality. The examination is somewhat limited by patient motion artifact. Electronically Signed   By: Inez Catalina M.D.   On: 12/25/2020 19:56   DG Chest Portable 1 View  Result Date:  12/25/2020 CLINICAL DATA:  Weakness, level 2 trauma, fall EXAM: PORTABLE CHEST 1 VIEW COMPARISON:  08/28/2020 FINDINGS: Heart and mediastinal contours are within normal limits. No focal opacities or effusions. No acute bony abnormality. IMPRESSION: No active disease. Electronically Signed   By: Rolm Baptise M.D.   On: 12/25/2020 18:48    Procedures Procedures   Medications Ordered in ED Medications - No data to display  ED Course  I have reviewed the triage vital signs and the nursing notes.  Pertinent labs & imaging results that were available during my care of the patient were reviewed by me and considered in my medical decision making (see chart for details).    MDM Rules/Calculators/A&P                           Patient presents after fall.  Fell and hit head.  Recent Ativan increased.  Head CT reassuring.  Since patient reportedly more sedate we will check some blood work and urine sample. Pending reassuring lab work will be discharged back to memory care unit  Discussed with Pts daughter. Recently started trazadone then switched to lorazapam 0.5 mg bid.  Has been more sedated since starting that medicine.  Has been more agitated after recently switching to nursing home His medicines were changed Final Clinical Impression(s) / ED Diagnoses Final diagnoses:  Fall, initial encounter    Rx / DC Orders ED Discharge Orders     None        Davonna Belling, MD 12/25/20 2029    Davonna Belling, MD 12/25/20 2036

## 2020-12-25 NOTE — ED Triage Notes (Signed)
Pt BIB [facility] from [facility] as Level 2/Fall on thinners. Facility reports she has had an increase in her sedative meds (ativan) recently & she fell backwards while walking around as usual in the facility & hit her head on the floor. She is on Eliquis, dementia at baseline. Upon arrival to ED she remains at baseline orientation, difficulty to keep pt's attention.

## 2020-12-25 NOTE — ED Provider Notes (Signed)
Care assumed from Dr. Alvino Chapel at shift change, please see their notes for full documentation of patient's complaint/HPI. Briefly, pt here with complaint of fall. Results so far show negative CT scans. Awaiting labs and U/A given daughter reported pt more sedated after starting ativan to rule out other causes. Plan is to dispo if negative.   Physical Exam  BP (!) 134/49   Pulse 98   Temp (!) 97.4 F (36.3 C) (Axillary)   Resp 20   Ht 5\' 2"  (1.575 m)   Wt 50 kg   SpO2 100%   BMI 20.16 kg/m   Physical Exam Vitals and nursing note reviewed.  Constitutional:      Appearance: She is not ill-appearing.  HENT:     Head: Normocephalic and atraumatic.  Eyes:     Conjunctiva/sclera: Conjunctivae normal.  Cardiovascular:     Rate and Rhythm: Normal rate and regular rhythm.  Pulmonary:     Effort: Pulmonary effort is normal.     Breath sounds: Normal breath sounds.  Skin:    General: Skin is warm and dry.     Coloration: Skin is not jaundiced.  Neurological:     Mental Status: She is alert.    ED Course/Procedures      Procedures  Results for orders placed or performed during the hospital encounter of 12/25/20  Comprehensive metabolic panel  Result Value Ref Range   Sodium 141 135 - 145 mmol/L   Potassium 3.7 3.5 - 5.1 mmol/L   Chloride 109 98 - 111 mmol/L   CO2 21 (L) 22 - 32 mmol/L   Glucose, Bld 108 (H) 70 - 99 mg/dL   BUN 18 8 - 23 mg/dL   Creatinine, Ser 0.64 0.44 - 1.00 mg/dL   Calcium 8.7 (L) 8.9 - 10.3 mg/dL   Total Protein 5.8 (L) 6.5 - 8.1 g/dL   Albumin 3.3 (L) 3.5 - 5.0 g/dL   AST 20 15 - 41 U/L   ALT 14 0 - 44 U/L   Alkaline Phosphatase 39 38 - 126 U/L   Total Bilirubin 0.8 0.3 - 1.2 mg/dL   GFR, Estimated >60 >60 mL/min   Anion gap 11 5 - 15  CBC  Result Value Ref Range   WBC 5.8 4.0 - 10.5 K/uL   RBC 4.39 3.87 - 5.11 MIL/uL   Hemoglobin 13.5 12.0 - 15.0 g/dL   HCT 42.3 36.0 - 46.0 %   MCV 96.4 80.0 - 100.0 fL   MCH 30.8 26.0 - 34.0 pg   MCHC 31.9  30.0 - 36.0 g/dL   RDW 13.7 11.5 - 15.5 %   Platelets 112 (L) 150 - 400 K/uL   nRBC 0.0 0.0 - 0.2 %  Valproic acid level  Result Value Ref Range   Valproic Acid Lvl 92 50.0 - 100.0 ug/mL    MDM  Workup overall reassuring. Labs unremarkable. Pt to be discharged back to facility.        Eustaquio Maize, PA-C 12/25/20 2254    Davonna Belling, MD 12/26/20 2116

## 2020-12-25 NOTE — Progress Notes (Signed)
Orthopedic Tech Progress Note Patient Details:  [name] 02/23/1934 [id number]  Level 2 trauma  Patient ID: [name], female   DOB: [birth year], 87 y.o.   MRN: [id number]  [name] 12/25/2020, 6:55 PM

## 2020-12-26 NOTE — ED Notes (Signed)
Waiting on [facility]... 

## 2020-12-26 NOTE — ED Notes (Signed)
Pt removed saturated brief and gown. Linens and brief changed

## 2020-12-26 NOTE — ED Notes (Signed)
Received verbal report from [place] R at this time

## 2021-08-21 ENCOUNTER — Emergency Department (HOSPITAL_COMMUNITY)
Admission: EM | Admit: 2021-08-21 | Discharge: 2021-08-21 | Disposition: A | Discharge status: Home / Self Care | Payer: Medicare Other | Attending: Emergency Medicine | Admitting: Emergency Medicine

## 2021-08-21 ENCOUNTER — Emergency Department (HOSPITAL_COMMUNITY): Payer: Medicare Other

## 2021-08-21 ENCOUNTER — Other Ambulatory Visit: Payer: Self-pay

## 2021-08-21 ENCOUNTER — Encounter (HOSPITAL_COMMUNITY): Payer: Self-pay

## 2021-08-21 DIAGNOSIS — S0003XA Contusion of scalp, initial encounter: Secondary | ICD-10-CM | POA: Diagnosis not present

## 2021-08-21 DIAGNOSIS — F0390 Unspecified dementia without behavioral disturbance: Secondary | ICD-10-CM | POA: Diagnosis not present

## 2021-08-21 DIAGNOSIS — W01198A Fall on same level from slipping, tripping and stumbling with subsequent striking against other object, initial encounter: Secondary | ICD-10-CM | POA: Diagnosis not present

## 2021-08-21 DIAGNOSIS — Z7901 Long term (current) use of anticoagulants: Secondary | ICD-10-CM | POA: Diagnosis not present

## 2021-08-21 DIAGNOSIS — S0990XA Unspecified injury of head, initial encounter: Secondary | ICD-10-CM | POA: Diagnosis present

## 2021-08-21 DIAGNOSIS — Y9301 Activity, walking, marching and hiking: Secondary | ICD-10-CM | POA: Insufficient documentation

## 2021-08-21 NOTE — ED Notes (Signed)
Pt transported to CT at this time w/ TRN

## 2021-08-21 NOTE — ED Notes (Signed)
Called staffing for sitter. Per staffing no sitters available. Notified CN

## 2021-08-21 NOTE — ED Notes (Signed)
Pt back in room from CT 

## 2021-08-21 NOTE — ED Notes (Signed)
I called [facility] but the  person on the phone there did not know who to tell me to give report to  so sher took my number and reported that she would gedt someone to call me back  ptar left before I got a call back

## 2021-08-21 NOTE — Progress Notes (Signed)
   08/21/21 1745  Clinical Encounter Type  Visited With Patient not available;Health care provider  Visit Type ED;Trauma (Level 2 Trauma)  Referral From Nurse Sammuel Bailiff, RN)  Consult/Referral To Chaplain Albertina Parr Morene Crocker)   Responded to page in [place]. E.D. Trauma Room C for Level 2 Trauma. Ms. Madysun Thall being evaluated and treated by medical staff at this time - Taken to CT, patient not seen by Chaplain.  No family present at this time.  Staff will page Chaplain upon request of patient or family.  Chaplain Sharai Overbay, M.Min., [phone].

## 2021-08-21 NOTE — ED Notes (Signed)
[facility] called for pt transport. 

## 2021-08-21 NOTE — Progress Notes (Signed)
   08/21/21 1815  Clinical Encounter Type  Visited With Patient;Health care provider  Visit Type ED;Trauma;Initial  Consult/Referral To Chaplain Melvenia Beam)   Patient returned from C.T. Met with Ms. Beyersdorf at bedside, who appeared confused when asking about family. She mentioned the name "Suanne Marker." Patient Contact Information lists Rhonda Bais-Terrell. Suanne Marker was not located in waiting area and had not checked in at security desk. [address] [place], M. Min., [phone].

## 2021-08-21 NOTE — ED Triage Notes (Signed)
Pt arrived to ED via EMS from [facility] as Level 2 Fall on Thinners (Eliquis). Pt had dementia and is A&Ox1 (self) at baseline. Pt had unwitnessed fall w/ impact to R posterior of head. Hematoma noted to same area. Pt also c/o L upper thigh pain per EMS. VSS w/ EMS. DNR form sent from facility w/ pt and at bedside.

## 2021-08-21 NOTE — Discharge Instructions (Addendum)
Return for any problem.  ?

## 2021-08-21 NOTE — ED Notes (Signed)
Pt has crawled out of bed and removed monitoring equipment two times now and was found walking around in her room. Sitter order being placed.

## 2021-08-21 NOTE — ED Notes (Signed)
Trauma Response Nurse Documentation   [name] is a 88 y.o. female arriving to [facility] ED via EMS  On Eliquis (apixaban) daily. Trauma was activated as a Level 2 by Penelope Coop based on the following trauma criteria Elderly patients > 65 with head trauma on anti-coagulation (excluding ASA). Trauma team at the bedside on patient arrival. Patient cleared for CT by Dr. Francia Greaves. Patient to CT with team. GCS 14.  History   Past Medical History:  Diagnosis Date   Anxiety    Atrophic vaginitis    CAD (coronary artery disease) 2008/2010   Catheterization 2008, 40% LAD after first diagonal  /  nuclear January, 2010 normal   Carotid artery disease ([place])    Doppler, July, 2011, 0-39% bilateral   Decreased platelet count ([place]) 04/2011   142,000   Endometrial polyp    GERD (gastroesophageal reflux disease)    Hyperlipidemia    Hypertension    Irritable bowel syndrome with constipation    Memory difficulty 01-20-13   "memory issues"   Normal nuclear stress test Feb 2013   No ischemia. Normal wall motion. EF greater than 70%   Osteoarthritis    Osteopenia    Osteoporosis    Palpitations    Monitor, May, 2013, no atrial fib,  one short run of PSVT   Paroxysmal atrial fibrillation (HCC)    Coumadin therapy   Polymyalgia rheumatica ([place])    Spinal stenosis    Stress    Vitamin D deficiency      Past Surgical History:  Procedure Laterality Date   BREAST SURGERY     BENIGN BREAST LUMP   CATARACT SURGERY  2009   COLONOSCOPY WITH PROPOFOL N/A 02/11/2013   Procedure: COLONOSCOPY WITH PROPOFOL;  Surgeon: [name], MD;  Location: [facility];  Service: Endoscopy;  Laterality: N/A;   DILATION AND CURETTAGE OF UTERUS     EXCISION MORTON'S NEUROMA  2000   HIP SURGERY Right 2005   debridement   HYSTEROSCOPY  02/07/2005   HYST W D&C, DX: PMB W SMALL ENDO POLYP-SURGEON [name]   LAMINECTOMY  March 2009   L4 through L5   TONSILLECTOMY         Initial Focused  Assessment (If applicable, or please see trauma documentation): See event summary  CT's Completed:   CT Head and CT C-Spine   Interventions:  See event summary  Plan for disposition:  Unknown at this time.  Consults completed:  None  Event Summary: Patient brought in by [facility], from [facility], patient experienced an unwitnessed fall this afternoon striking head. Patient is on Eliquis. Patient complaint of head pain and left thigh pain with EMS. Upon initial assessment in department, patient complaining of head pain with hematoma noted to right posterior head. No complaint of thigh pain upon assessment. No other visible signs of trauma. Patient does have a history of dementia, GCS 14 but baseline for her. Patient was placed in c collar for c-spine protection via EMS.  Xray chest Xray pelvis CT head CT c-spine   Bedside handoff with ED RN Raquel Sarna.    Trudee Kuster  Trauma Response RN  Please call TRN at [phone] for further assistance.

## 2021-08-21 NOTE — ED Provider Notes (Signed)
[facility] EMERGENCY DEPARTMENT Provider Note   CSN: [id number] Arrival date & time: 08/21/21  1747     History  No chief complaint on file.   [name] is a 88 y.o. female.  88 year old female with prior medical history as detailed below presents for evaluation.  Patient presents after fall from standing.  Patient was at her facility.  Patient had a fall.  She apparently struck the back of her head.  No LOC reported.  Patient is at normal mental status baseline.  Patient appears to be comfortable.  She is not reporting pain.    The history is provided by the patient and medical records.  Head Injury Location:  Occipital Time since incident:  30 minutes Mechanism of injury: fall   Fall:    Fall occurred:  Walking   Point of impact:  Head      Home Medications Prior to Admission medications   Medication Sig Start Date End Date Taking? Authorizing Provider  acetaminophen (TYLENOL) 325 MG tablet Take 325 mg by mouth every 4 (four) hours as needed (for pain).    [provider]  alendronate (FOSAMAX) 70 MG tablet Take 70 mg by mouth every Wednesday. Take with a full glass of water on an empty stomach.    [provider]  cephALEXin (KEFLEX) 250 MG capsule Take 1 capsule (250 mg total) by mouth 4 (four) times daily. Patient not taking: No sig reported 08/28/20   Daleen Bo, MD  Cholecalciferol (VITAMIN D-3) 25 MCG (1000 UT) CAPS Take 1,000 Units by mouth daily.    [provider]  divalproex (DEPAKOTE SPRINKLE) 125 MG capsule Take 250 mg by mouth 3 (three) times daily.    [provider]  ELIQUIS 2.5 MG TABS tablet Take 2.5 mg by mouth in the morning and at bedtime.    [provider]  LORazepam (ATIVAN) 0.5 MG tablet Take 0.5 mg by mouth 2 (two) times daily as needed for anxiety.    [provider]  melatonin 3 MG TABS tablet Take 1 tablet (3 mg total) by mouth at bedtime. Patient not taking:  No sig reported 08/28/20   Daleen Bo, MD  melatonin 5 MG TABS Take 5 mg by mouth at bedtime.    [provider]  Multiple Vitamins-Minerals (PRESERVISION AREDS 2) CHEW Chew 1 tablet by mouth daily.    [provider]  ondansetron (ZOFRAN) 4 MG tablet Take 4 mg by mouth every 8 (eight) hours as needed for nausea.    [provider]  sodium chloride (OCEAN) 0.65 % SOLN nasal spray Place 2 sprays into both nostrils 4 (four) times daily.    [provider]  SORE THROAT RELIEF 15-3.6 MG LOZG Use as directed 1 lozenge in the mouth or throat as needed (for a sore throat).    [provider]  estradiol (ESTRACE) 1 MG tablet Take 1 mg by mouth as needed.   03/16/11  [provider]      Allergies    Patient has no known allergies.    Review of Systems   Review of Systems  All other systems reviewed and are negative.   Physical Exam Updated Vital Signs BP (!) 176/82   Pulse (!) 109   Temp 98 F (36.7 C)   Resp (!) 21   Ht '5\' 2"'$  (1.575 m)   Wt 50 kg   SpO2 96%   BMI 20.16 kg/m  Physical Exam Vitals and nursing  note reviewed.  Constitutional:      General: She is not in acute distress.    Appearance: Normal appearance. She is well-developed.  HENT:     Head: Normocephalic.     Comments: Contusion noted to posterior scalp Eyes:     Conjunctiva/sclera: Conjunctivae normal.     Pupils: Pupils are equal, round, and reactive to light.  Cardiovascular:     Rate and Rhythm: Normal rate and regular rhythm.     Heart sounds: Normal heart sounds.  Pulmonary:     Effort: Pulmonary effort is normal. No respiratory distress.     Breath sounds: Normal breath sounds.  Abdominal:     General: There is no distension.     Palpations: Abdomen is soft.     Tenderness: There is no abdominal tenderness.  Musculoskeletal:        General: No deformity. Normal range of motion.     Cervical back: Normal range of motion and neck supple.  Skin:     General: Skin is warm and dry.  Neurological:     General: No focal deficit present.     Mental Status: She is alert. Mental status is at baseline.     ED Results / Procedures / Treatments   Labs (all labs ordered are listed, but only abnormal results are displayed) Labs Reviewed - No data to display  EKG None  Radiology No results found.  Procedures Procedures    Medications Ordered in ED Medications - No data to display  ED Course/ Medical Decision Making/ A&P                           Medical Decision Making Amount and/or Complexity of Data Reviewed Radiology: ordered.    Medical Screen Complete  This patient presented to the ED with complaint of fall, head injury.  This complaint involves an extensive number of treatment options. The initial differential diagnosis includes, but is not limited to, intracranial injury after fall  This presentation is: Acute, Self-Limited, Previously Undiagnosed, Uncertain Prognosis, Complicated, Systemic Symptoms, and Threat to Life/Bodily Function  Patient presents after fall from standing  She sustained a head injury.  She is without evidence on exam of significant traumatic injury.  Imaging studies obtained are without significant abnormality.  Patient appears to be appropriate for discharge  Patient's daughter Suanne Marker) understands ED work-up findings and understands need for close outpatient follow-up.  Strict return precautions given and understood.    Co morbidities that complicated the patient's evaluation  Dementia   Additional history obtained:  Additional history obtained from EMS External records from outside sources obtained and reviewed including prior ED visits and prior Inpatient records.    Imaging Studies ordered:  I ordered imaging studies including CT head, CT cervical spine, plain films of chest and pelvis I independently visualized and interpreted obtained imaging which showed NAD I agree  with the radiologist interpretation.   Cardiac Monitoring:  The patient was maintained on a cardiac monitor.  I personally viewed and interpreted the cardiac monitor which showed an underlying rhythm of: NSR  Problem List / ED Course:  Fall, head injury   Reevaluation:  After the interventions noted above, I reevaluated the patient and found that they have: improved    Disposition:  After consideration of the diagnostic results and the patients response to treatment, I feel that the patent would benefit from close outpatient followup.  Final Clinical Impression(s) / ED Diagnoses Final diagnoses:  Injury of head, initial encounter    Rx / DC Orders ED Discharge Orders     None         Valarie Merino, MD 08/21/21 Einar Crow

## 2021-09-08 ENCOUNTER — Emergency Department (HOSPITAL_COMMUNITY)
Admission: EM | Admit: 2021-09-08 | Discharge: 2021-09-08 | Disposition: A | Discharge status: Skilled Nursing Facility | Payer: Medicare Other | Attending: Emergency Medicine | Admitting: Emergency Medicine

## 2021-09-08 ENCOUNTER — Emergency Department (HOSPITAL_COMMUNITY): Payer: Medicare Other

## 2021-09-08 DIAGNOSIS — F0390 Unspecified dementia without behavioral disturbance: Secondary | ICD-10-CM | POA: Diagnosis present

## 2021-09-08 DIAGNOSIS — I480 Paroxysmal atrial fibrillation: Secondary | ICD-10-CM | POA: Insufficient documentation

## 2021-09-08 DIAGNOSIS — W19XXXA Unspecified fall, initial encounter: Secondary | ICD-10-CM | POA: Diagnosis not present

## 2021-09-08 DIAGNOSIS — F419 Anxiety disorder, unspecified: Secondary | ICD-10-CM | POA: Insufficient documentation

## 2021-09-08 DIAGNOSIS — I2510 Atherosclerotic heart disease of native coronary artery without angina pectoris: Secondary | ICD-10-CM | POA: Insufficient documentation

## 2021-09-08 DIAGNOSIS — I4891 Unspecified atrial fibrillation: Secondary | ICD-10-CM

## 2021-09-08 LAB — COMPREHENSIVE METABOLIC PANEL
ALT: 10 U/L (ref 0–44)
AST: 19 U/L (ref 15–41)
Albumin: 3.5 g/dL (ref 3.5–5.0)
Alkaline Phosphatase: 66 U/L (ref 38–126)
Anion gap: 6 (ref 5–15)
BUN: 15 mg/dL (ref 8–23)
CO2: 27 mmol/L (ref 22–32)
Calcium: 8.8 mg/dL — ABNORMAL LOW (ref 8.9–10.3)
Chloride: 108 mmol/L (ref 98–111)
Creatinine, Ser: 0.78 mg/dL (ref 0.44–1.00)
GFR, Estimated: >60 mL/min (ref >60)
Glucose, Bld: 105 mg/dL — ABNORMAL HIGH (ref 70–99)
Potassium: 3.9 mmol/L (ref 3.5–5.1)
Sodium: 141 mmol/L (ref 135–145)
Total Bilirubin: 0.9 mg/dL (ref 0.3–1.2)
Total Protein: 6.2 g/dL — ABNORMAL LOW (ref 6.5–8.1)

## 2021-09-08 LAB — URINALYSIS, ROUTINE W REFLEX MICROSCOPIC
Bilirubin Urine: NEGATIVE
Glucose, UA: NEGATIVE mg/dL
Hgb urine dipstick: NEGATIVE
Ketones, ur: NEGATIVE mg/dL
Leukocytes,Ua: NEGATIVE
Nitrite: NEGATIVE
Protein, ur: NEGATIVE mg/dL
Specific Gravity, Urine: 1.015 (ref 1.005–1.030)
pH: 6 (ref 5.0–8.0)

## 2021-09-08 LAB — CBC
HCT: 38.2 % (ref 36.0–46.0)
Hemoglobin: 12 g/dL (ref 12.0–15.0)
MCH: 31.8 pg (ref 26.0–34.0)
MCHC: 31.4 g/dL (ref 30.0–36.0)
MCV: 101.3 fL — ABNORMAL HIGH (ref 80.0–100.0)
Platelets: 177 10*3/uL (ref 150–400)
RBC: 3.77 MIL/uL — ABNORMAL LOW (ref 3.87–5.11)
RDW: 13.1 % (ref 11.5–15.5)
WBC: 6.4 10*3/uL (ref 4.0–10.5)
nRBC: 0 % (ref 0.0–0.2)

## 2021-09-08 LAB — VALPROIC ACID LEVEL: Valproic Acid Lvl: 51 ug/mL (ref 50.0–100.0)

## 2021-09-08 LAB — SAMPLE TO BLOOD BANK

## 2021-09-08 MED ORDER — SODIUM CHLORIDE 0.9 % IV SOLN: 250.0000 mL | INTRAVENOUS | Status: DC | PRN

## 2021-09-08 MED ORDER — SODIUM CHLORIDE 0.9% FLUSH: 3.0000 mL | Freq: Two times a day (BID) | INTRAVENOUS | Status: DC

## 2021-09-08 MED ORDER — SODIUM CHLORIDE 0.9% FLUSH: 3.0000 mL | INTRAVENOUS | Status: DC | PRN

## 2021-09-08 NOTE — Progress Notes (Signed)
Trauma Response Nurse Documentation   [name] is a 88 y.o. female arriving to [facility] ED via EMS  On Eliquis (apixaban) daily. Trauma was activated as a Level 2 based on the following trauma criteria Elderly patients > 65 with head trauma on anti-coagulation (excluding ASA). Trauma team at the bedside on patient arrival. Patient cleared for CT by Dr. Tomi Bamberger. Patient to CT with team. GCS 14, baseline dementia and disoriented x4. From [facility] dementia care unit.  History   Past Medical History:  Diagnosis Date   Anxiety    Atrophic vaginitis    CAD (coronary artery disease) 2008/2010   Catheterization 2008, 40% LAD after first diagonal  /  nuclear January, 2010 normal   Carotid artery disease ([place])    Doppler, July, 2011, 0-39% bilateral   Decreased platelet count ([place]) 04/2011   142,000   Endometrial polyp    GERD (gastroesophageal reflux disease)    Hyperlipidemia    Hypertension    Irritable bowel syndrome with constipation    Memory difficulty 01-20-13   "memory issues"   Normal nuclear stress test Feb 2013   No ischemia. Normal wall motion. EF greater than 70%   Osteoarthritis    Osteopenia    Osteoporosis    Palpitations    Monitor, May, 2013, no atrial fib,  one short run of PSVT   Paroxysmal atrial fibrillation (HCC)    Coumadin therapy   Polymyalgia rheumatica ([place])    Spinal stenosis    Stress    Vitamin D deficiency      Past Surgical History:  Procedure Laterality Date   BREAST SURGERY     BENIGN BREAST LUMP   CATARACT SURGERY  2009   COLONOSCOPY WITH PROPOFOL N/A 02/11/2013   Procedure: COLONOSCOPY WITH PROPOFOL;  Surgeon: [name], MD;  Location: [facility] ENDOSCOPY;  Service: Endoscopy;  Laterality: N/A;   DILATION AND CURETTAGE OF UTERUS     EXCISION MORTON'S NEUROMA  2000   HIP SURGERY Right 2005   debridement   HYSTEROSCOPY  02/07/2005   HYST W D&C, DX: PMB W SMALL ENDO POLYP-SURGEON [name]   LAMINECTOMY  March 2009   L4  through L5   TONSILLECTOMY       Initial Focused Assessment (If applicable, or please see trauma documentation): Patient opens eyes, responds with some short answers to questions that seem appropriate PERR 3, disoriented x4 which is her baseline. Unable to participate much in exam, unable to voice that she has pain. Resting comfortably on stretcher, C-collar placed by EMS, no obvious traumatic injuries identified during assessment.  CT's Completed:   CT Head and CT C-Spine   Interventions:  IV, labs XR Chest/Pelvis CT Head, Cspine  Plan for disposition:  Discharge home - most likely to discharge back to [facility] pending further results are all negative. No traumatic injury at this time.  Bedside handoff with ED RN [place].    [place]  Trauma Response RN  Please call TRN at [phone] for further assistance.

## 2021-09-08 NOTE — Discharge Instructions (Signed)
Stop taking the Eliquis for now because of the recurrent falls.  Contact your doctor to discuss whether it is appropriate to resume taking Eliquis for stroke prevention associated with her A-fib.

## 2021-09-08 NOTE — ED Notes (Signed)
Patient transported to CT 

## 2021-09-08 NOTE — Progress Notes (Signed)
Responded to this level II fall on thinners.  EMT indicated patient came from a facility and they were to contact family.  Patient continues to be evaluated.  Chaplain available as needed for support. [place], Mdiv.    09/08/21 0800  Clinical Encounter Type  Visited With Health care provider;Patient not available  Visit Type Initial;Trauma;ED  Referral From Nurse  Consult/Referral To Chaplain

## 2021-09-08 NOTE — ED Provider Notes (Signed)
[facility] EMERGENCY DEPARTMENT Provider Note   CSN: [id number] Arrival date & time: 09/08/21  7017     History  Chief complaint: Trauma, fall  [name] is a 88 y.o. female.  HPI  Patient has a history of spinal stenosis anxiety GERD paroxysmal A-fib, hypertension, hyperlipidemia, coronary artery disease dementia who presents to the ED after a fall at the nursing home.  Patient resides at a nursing facility.  Staff found her on the floor earlier this morning.  She was assisted back to the bed.  Patient remained in bed this morning so she was brought to the ED for further evaluation.  Patient was recently in the emergency room on June 11 for a fall.  Staff were concerned about her recurrent falls which prompted them to call the emergency room this morning.  Patient appears to be at her baseline.  She complains of pain and wear the cervical collar abutts her head  Home Medications Prior to Admission medications   Medication Sig Start Date End Date Taking? Authorizing Provider  acetaminophen (TYLENOL) 325 MG tablet Take 325 mg by mouth every 6 (six) hours as needed (pain).   Yes [provider]  alendronate (FOSAMAX) 70 MG tablet Take 70 mg by mouth once a week. Take with a full glass of water on an empty stomach.   Yes [provider]  busPIRone (BUSPAR) 5 MG tablet Take 5 mg by mouth 3 (three) times daily. 08/17/21  Yes [provider]  Cholecalciferol (VITAMIN D-3) 25 MCG (1000 UT) CAPS Take 1,000 Units by mouth daily.   Yes [provider]  DEPAKOTE 250 MG DR tablet Take 250 mg by mouth 2 (two) times daily. 08/23/21  Yes [provider]  ELIQUIS 2.5 MG TABS tablet Take 2.5 mg by mouth in the morning and at bedtime.   Yes [provider]  Melatonin 10 MG CAPS Take 10 mg by mouth at bedtime.   Yes [provider]  Multiple Vitamins-Minerals (PRESERVISION AREDS 2) CHEW Chew 1 tablet by mouth daily.   Yes  [provider]  ondansetron (ZOFRAN) 4 MG tablet Take 4 mg by mouth every 8 (eight) hours as needed for nausea.   Yes [provider]  polyethylene glycol (MIRALAX / GLYCOLAX) 17 g packet Take 17 g by mouth daily as needed (constipation).   Yes [provider]  sertraline (ZOLOFT) 50 MG tablet Take 75 mg by mouth daily. 08/18/21  Yes [provider]  sodium chloride (OCEAN) 0.65 % SOLN nasal spray Place 2 sprays into both nostrils 4 (four) times daily.   Yes [provider]  SORE THROAT RELIEF 15-3.6 MG LOZG Use as directed 1 lozenge in the mouth or throat as needed (for a sore throat).   Yes [provider]  traZODone (DESYREL) 50 MG tablet Take 50 mg by mouth at bedtime. 08/24/21  Yes [provider]  cephALEXin (KEFLEX) 250 MG capsule Take 1 capsule (250 mg total) by mouth 4 (four) times daily. Patient not taking: Reported on 09/08/2021 08/28/20   Daleen Bo, MD  divalproex (DEPAKOTE SPRINKLE) 125 MG capsule Take 250 mg by mouth 3 (three) times daily. Patient not taking: Reported on 09/08/2021    [provider]  LORazepam (ATIVAN) 0.5 MG tablet Take 0.5 mg by mouth 2 (two) times daily as needed for anxiety. Patient not taking: Reported on 09/08/2021    [provider]  melatonin 3 MG TABS tablet Take 1 tablet (  3 mg total) by mouth at bedtime. Patient not taking: Reported on 12/25/2020 08/28/20   Daleen Bo, MD  melatonin 5 MG TABS Take 5 mg by mouth at bedtime. Patient not taking: Reported on 09/08/2021    [provider]  estradiol (ESTRACE) 1 MG tablet Take 1 mg by mouth as needed.   03/16/11  [provider]      Allergies    Memantine hcl-donepezil hcl    Review of Systems   Review of Systems  Constitutional:  Negative for fever.    Physical Exam Updated Vital Signs BP (!) 119/105   Pulse 73   Temp 97.8 F (36.6 C) (Oral)   Resp (!) 21   Ht 1.575 m ('5\' 2"'$ )   Wt 50 kg   SpO2  98%   BMI 20.16 kg/m  Physical Exam Vitals and nursing note reviewed.  Constitutional:      Appearance: She is well-developed.     Comments: Anxious, tearful  HENT:     Head: Normocephalic and atraumatic.     Right Ear: External ear normal.     Left Ear: External ear normal.  Eyes:     General: No scleral icterus.       Right eye: No discharge.        Left eye: No discharge.     Conjunctiva/sclera: Conjunctivae normal.  Neck:     Trachea: No tracheal deviation.  Cardiovascular:     Rate and Rhythm: Normal rate and regular rhythm.  Pulmonary:     Effort: Pulmonary effort is normal. No respiratory distress.     Breath sounds: Normal breath sounds. No stridor.  Abdominal:     General: There is no distension.     Tenderness: There is no abdominal tenderness.  Musculoskeletal:        General: No swelling or deformity.     Cervical back: Neck supple.     Comments: No tenderness to palpation bilateral upper extremities and lower extremities, able to range bilateral upper and lower extremities with apparent discomfort, no tenderness palpation thoracic or lumbar spine, cervical collar in place  Skin:    General: Skin is warm and dry.     Findings: No rash.  Neurological:     General: No focal deficit present.     Mental Status: She is alert.     GCS: GCS eye subscore is 4. GCS verbal subscore is 4. GCS motor subscore is 5.     Cranial Nerves: Cranial nerve deficit: no gross deficits.     Comments: Patient moves extremities without difficulty, no facial droop, no slurred speech,     ED Results / Procedures / Treatments   Labs (all labs ordered are listed, but only abnormal results are displayed) Labs Reviewed  COMPREHENSIVE METABOLIC PANEL - Abnormal; Notable for the following components:      Result Value   Glucose, Bld 105 (*)    Calcium 8.8 (*)    Total Protein 6.2 (*)    All other components within normal limits  CBC - Abnormal; Notable for the following components:    RBC 3.77 (*)    MCV 101.3 (*)    All other components within normal limits  URINALYSIS, ROUTINE W REFLEX MICROSCOPIC  VALPROIC ACID LEVEL  SAMPLE TO BLOOD BANK    EKG EKG Interpretation  Date/Time:  Thursday September 08 2021 07:52:57 EDT Ventricular Rate:  77 PR Interval:    QRS Duration: 127 QT Interval:  418 QTC Calculation: 474  R Axis:   5 Text Interpretation: Atrial fibrillation LVH with secondary repolarization abnormality Anterior infarct, old No significant change since last tracing Confirmed by Dorie Rank [phone]) on 09/08/2021 8:03:06 AM  Radiology CT HEAD WO CONTRAST  Result Date: 09/08/2021 CLINICAL DATA:  Trauma EXAM: CT HEAD WITHOUT CONTRAST CT CERVICAL SPINE WITHOUT CONTRAST TECHNIQUE: Multidetector CT imaging of the head and cervical spine was performed following the standard protocol without intravenous contrast. Multiplanar CT image reconstructions of the cervical spine were also generated. RADIATION DOSE REDUCTION: This exam was performed according to the departmental dose-optimization program which includes automated exposure control, adjustment of the mA and/or kV according to patient size and/or use of iterative reconstruction technique. COMPARISON:  CT head and cervical spine dated December 25, 2020 FINDINGS: CT HEAD FINDINGS Brain: Chronic white matter ischemic change and generalized atrophy. No evidence of acute infarction, hemorrhage, hydrocephalus, extra-axial collection or mass lesion/mass effect. Vascular: No hyperdense vessel or unexpected calcification. Skull: Normal. Negative for fracture or focal lesion. Sinuses/Orbits: No acute finding. Other: None. CT CERVICAL SPINE FINDINGS Alignment: Normal. Skull base and vertebrae: No acute fracture. No primary bone lesion or focal pathologic process. Soft tissues and spinal canal: No prevertebral fluid or swelling. No visible canal hematoma. Disc levels:  Mild multilevel degenerative disc disease. Upper chest: Negative. Other:  None. IMPRESSION: 1. No acute intracranial abnormality. 2. No evidence of acute cervical spine fracture or traumatic malalignment. Electronically Signed   By: Yetta Glassman M.D.   On: 09/08/2021 08:34   CT CERVICAL SPINE WO CONTRAST  Result Date: 09/08/2021 CLINICAL DATA:  Trauma EXAM: CT HEAD WITHOUT CONTRAST CT CERVICAL SPINE WITHOUT CONTRAST TECHNIQUE: Multidetector CT imaging of the head and cervical spine was performed following the standard protocol without intravenous contrast. Multiplanar CT image reconstructions of the cervical spine were also generated. RADIATION DOSE REDUCTION: This exam was performed according to the departmental dose-optimization program which includes automated exposure control, adjustment of the mA and/or kV according to patient size and/or use of iterative reconstruction technique. COMPARISON:  CT head and cervical spine dated December 25, 2020 FINDINGS: CT HEAD FINDINGS Brain: Chronic white matter ischemic change and generalized atrophy. No evidence of acute infarction, hemorrhage, hydrocephalus, extra-axial collection or mass lesion/mass effect. Vascular: No hyperdense vessel or unexpected calcification. Skull: Normal. Negative for fracture or focal lesion. Sinuses/Orbits: No acute finding. Other: None. CT CERVICAL SPINE FINDINGS Alignment: Normal. Skull base and vertebrae: No acute fracture. No primary bone lesion or focal pathologic process. Soft tissues and spinal canal: No prevertebral fluid or swelling. No visible canal hematoma. Disc levels:  Mild multilevel degenerative disc disease. Upper chest: Negative. Other: None. IMPRESSION: 1. No acute intracranial abnormality. 2. No evidence of acute cervical spine fracture or traumatic malalignment. Electronically Signed   By: Yetta Glassman M.D.   On: 09/08/2021 08:34   DG Pelvis 1-2 Views  Result Date: 09/08/2021 CLINICAL DATA:  Trauma.  Fall. EXAM: PELVIS - 1-2 VIEW COMPARISON:  None Available. FINDINGS: Pelvic bony  ring is intact. Both hips appear to be located on this single view without obvious fracture. Gas and stool in the rectum. IMPRESSION: No acute bone abnormality in the pelvis. Electronically Signed   By: Markus Daft M.D.   On: 09/08/2021 08:30   DG Chest 1 View  Result Date: 09/08/2021 CLINICAL DATA:  Trauma, clearing films, dementia EXAM: CHEST  1 VIEW COMPARISON:  08/21/2021 FINDINGS: Normal heart size, mediastinal contours, and pulmonary vascularity. Chronic peribronchial thickening. Lungs clear without pulmonary infiltrate, pleural  effusion, or pneumothorax. Osseous demineralization without acute abnormality. IMPRESSION: No acute abnormalities. Electronically Signed   By: Lavonia Dana M.D.   On: 09/08/2021 08:30    Procedures Procedures    Medications Ordered in ED Medications  sodium chloride flush (NS) 0.9 % injection 3 mL (has no administration in time range)  sodium chloride flush (NS) 0.9 % injection 3 mL (has no administration in time range)  0.9 %  sodium chloride infusion (has no administration in time range)    ED Course/ Medical Decision Making/ A&P Clinical Course as of 09/08/21 1052  Thu Sep 08, 2021  0859 Valproic acid level Therapeutic range [JK]  0859 CBC(!) No anemia or leukocytosis [JK]  0859 Comprehensive metabolic panel(!) Normal [JK]  0900 DG Chest 1 View Chest x-ray images and radiology report reviewed.  No acute findings [JK]  0900 DG Pelvis 1-2 Views Pelvics x-ray and radiology report reviewed.  No acute findings [JK]  0900 CT CERVICAL SPINE WO CONTRAST CT head and C-spine without acute findings [JK]  0900 Patient was able to get up out of the bed on her own.  She has been ambulating around without difficulty [JK]  1020 Urinalysis, Routine w reflex microscopic Urine, Clean Catch Urinalysis without signs of infection [JK]    Clinical Course User Index [JK] Dorie Rank, MD                           Medical Decision Making Problems Addressed: Atrial  fibrillation, unspecified type [facility]): chronic illness or injury Dementia, unspecified dementia severity, unspecified dementia type, unspecified whether behavioral, psychotic, or mood disturbance or anxiety ([place]): chronic illness or injury with exacerbation, progression, or side effects of treatment Fall, initial encounter: acute illness or injury that poses a threat to life or bodily functions  Amount and/or Complexity of Data Reviewed Independent Historian: caregiver    Details: Patient's daughter is here at the bedside.  Additional information obtained from her.  Study results and findings in the ED also discussed with patient's daughter Labs: ordered. Decision-making details documented in ED Course. Radiology: ordered and independent interpretation performed. Decision-making details documented in ED Course.  Risk Prescription drug management.   Patient presented to the ED for evaluation after fall.  Patient does have history of dementia and resides at [facility] screens.  Patient is currently on Eliquis for A-fib.  Daughter states patient has been having recurrent falls.  ED work-up is reassuring without signs of any serious injury.  She also does not appear to be having any signs of acute anemia or severe dehydration or infection.  EKG shows her chronic A-fib.  Daughter states patient has been having more falls recently.  She is at risk for stroke because of her A-fib but I am concerned about her recurrent falls and potential for serious injury associated with her chronic Eliquis.  I had a discussion with the patient's daughter.  Explained to her the risk of her having serious bleeding because of her recurrent falls.  Patient was able to ambulate in the ED and I am not seeing any signs of ataxia or stroke.  However cognitively she does not understand to use a walker.  I think she is at risk for recurrent falls and it may be appropriate for her to discontinue Eliquis.  Recommended she hold that for  now and speak with the doctor about whether is appropriate for her to resume that medication  Final Clinical Impression(s) / ED Diagnoses Final diagnoses:  Fall, initial encounter  Dementia, unspecified dementia severity, unspecified dementia type, unspecified whether behavioral, psychotic, or mood disturbance or anxiety ([place])  Atrial fibrillation, unspecified type [facility])    Rx / DC Orders ED Discharge Orders     None         Dorie Rank, MD 09/08/21 1052

## 2021-09-08 NOTE — ED Notes (Signed)
Pt assisted to bathroom, standby assist.

## 2021-09-08 NOTE — ED Triage Notes (Signed)
Pt BIB EMS from [facility] Level 2 fall on thinners. Pt is on eliquis for a-fib. Pt fell from standing and landed on hard flooring. Unknown amount of downtime, unknown LOC. Pt is disoriented x4 and is from the dementia unit. No obvious injuries upon arrival.

## 2021-09-08 NOTE — ED Notes (Signed)
Pt got herself out of bed and was walking toward the nurse's station. Is in no distress, able to move freely, no obvious injuries from fall. Pt is not oriented to place/situation

## 2021-10-21 ENCOUNTER — Emergency Department (HOSPITAL_COMMUNITY): Payer: Medicare Other

## 2021-10-21 ENCOUNTER — Emergency Department (HOSPITAL_COMMUNITY)
Admission: EM | Admit: 2021-10-21 | Discharge: 2021-10-21 | Disposition: A | Discharge status: Home / Self Care | Payer: Medicare Other | Attending: Emergency Medicine | Admitting: Emergency Medicine

## 2021-10-21 DIAGNOSIS — Z79899 Other long term (current) drug therapy: Secondary | ICD-10-CM | POA: Diagnosis not present

## 2021-10-21 DIAGNOSIS — S0003XA Contusion of scalp, initial encounter: Secondary | ICD-10-CM | POA: Insufficient documentation

## 2021-10-21 DIAGNOSIS — F0390 Unspecified dementia without behavioral disturbance: Secondary | ICD-10-CM | POA: Insufficient documentation

## 2021-10-21 DIAGNOSIS — Z7901 Long term (current) use of anticoagulants: Secondary | ICD-10-CM | POA: Diagnosis not present

## 2021-10-21 DIAGNOSIS — W08XXXA Fall from other furniture, initial encounter: Secondary | ICD-10-CM | POA: Insufficient documentation

## 2021-10-21 DIAGNOSIS — S0990XA Unspecified injury of head, initial encounter: Secondary | ICD-10-CM | POA: Diagnosis present

## 2021-10-21 DIAGNOSIS — W19XXXA Unspecified fall, initial encounter: Secondary | ICD-10-CM

## 2021-10-21 LAB — COMPREHENSIVE METABOLIC PANEL
ALT: 10 U/L (ref 0–44)
AST: 22 U/L (ref 15–41)
Albumin: 3.6 g/dL (ref 3.5–5.0)
Alkaline Phosphatase: 49 U/L (ref 38–126)
Anion gap: 7 (ref 5–15)
BUN: 23 mg/dL (ref 8–23)
CO2: 24 mmol/L (ref 22–32)
Calcium: 8.9 mg/dL (ref 8.9–10.3)
Chloride: 108 mmol/L (ref 98–111)
Creatinine, Ser: 0.86 mg/dL (ref 0.44–1.00)
GFR, Estimated: >60 mL/min (ref >60)
Glucose, Bld: 117 mg/dL — ABNORMAL HIGH (ref 70–99)
Potassium: 4.5 mmol/L (ref 3.5–5.1)
Sodium: 139 mmol/L (ref 135–145)
Total Bilirubin: 0.6 mg/dL (ref 0.3–1.2)
Total Protein: 6 g/dL — ABNORMAL LOW (ref 6.5–8.1)

## 2021-10-21 LAB — CBC
HCT: 39.7 % (ref 36.0–46.0)
Hemoglobin: 12.9 g/dL (ref 12.0–15.0)
MCH: 32.7 pg (ref 26.0–34.0)
MCHC: 32.5 g/dL (ref 30.0–36.0)
MCV: 100.8 fL — ABNORMAL HIGH (ref 80.0–100.0)
Platelets: 130 10*3/uL — ABNORMAL LOW (ref 150–400)
RBC: 3.94 MIL/uL (ref 3.87–5.11)
RDW: 12.8 % (ref 11.5–15.5)
WBC: 8.3 10*3/uL (ref 4.0–10.5)
nRBC: 0 % (ref 0.0–0.2)

## 2021-10-21 LAB — I-STAT CHEM 8, ED
BUN: 23 mg/dL (ref 8–23)
Calcium, Ion: 1.15 mmol/L (ref 1.15–1.40)
Chloride: 106 mmol/L (ref 98–111)
Creatinine, Ser: 0.7 mg/dL (ref 0.44–1.00)
Glucose, Bld: 119 mg/dL — ABNORMAL HIGH (ref 70–99)
HCT: 36 % (ref 36.0–46.0)
Hemoglobin: 12.2 g/dL (ref 12.0–15.0)
Potassium: 4.2 mmol/L (ref 3.5–5.1)
Sodium: 142 mmol/L (ref 135–145)
TCO2: 24 mmol/L (ref 22–32)

## 2021-10-21 LAB — SAMPLE TO BLOOD BANK

## 2021-10-21 LAB — ETHANOL: Alcohol, Ethyl (B): <10 mg/dL (ref <10)

## 2021-10-21 LAB — PROTIME-INR
INR: 1.1 (ref 0.8–1.2)
Prothrombin Time: 14.1 seconds (ref 11.4–15.2)

## 2021-10-21 MED ORDER — FENTANYL CITRATE PF 50 MCG/ML IJ SOSY
12.5000 ug | PREFILLED_SYRINGE | Freq: Once | INTRAMUSCULAR | Status: AC
  Administered 2021-10-21: 12.5 ug via INTRAVENOUS
  Filled 2021-10-21: qty 1

## 2021-10-21 MED ORDER — MIDAZOLAM HCL 2 MG/2ML IJ SOLN
2.0000 mg | Freq: Once | INTRAMUSCULAR | Status: AC
  Administered 2021-10-21: 2 mg via INTRAVENOUS
  Filled 2021-10-21: qty 2

## 2021-10-21 NOTE — ED Notes (Signed)
Requested Network engineer to contact [facility] at this time

## 2021-10-21 NOTE — ED Notes (Signed)
Pt log rolled

## 2021-10-21 NOTE — ED Notes (Signed)
Verbal report given to Arielle L RN at this time

## 2021-10-21 NOTE — ED Provider Notes (Signed)
[facility] EMERGENCY DEPARTMENT Provider Note   CSN: [id number] Arrival date & time: 10/21/21  1911     History  Chief Complaint  Patient presents with   [name]    [name] is a 88 y.o. female.  HPI Level 2 trauma Level 5 caveat secondary to dementia 88 year old female transported here from skilled nursing facility with report of history of dementia, on blood thinners for A-fib, reported mechanical fall today with head injury.  EMS reports that they noted a hematoma to her left posterior head.  Did not note any other injuries.     Home Medications Prior to Admission medications   Medication Sig Start Date End Date Taking? Authorizing Provider  acetaminophen (TYLENOL) 325 MG tablet Take 325 mg by mouth every 6 (six) hours as needed (pain).    [provider]  alendronate (FOSAMAX) 70 MG tablet Take 70 mg by mouth once a week. Take with a full glass of water on an empty stomach.    [provider]  busPIRone (BUSPAR) 5 MG tablet Take 5 mg by mouth 3 (three) times daily. 08/17/21   [provider]  cephALEXin (KEFLEX) 250 MG capsule Take 1 capsule (250 mg total) by mouth 4 (four) times daily. Patient not taking: Reported on 09/08/2021 08/28/20   Daleen Bo, MD  Cholecalciferol (VITAMIN D-3) 25 MCG (1000 UT) CAPS Take 1,000 Units by mouth daily.    [provider]  DEPAKOTE 250 MG DR tablet Take 250 mg by mouth 2 (two) times daily. 08/23/21   [provider]  divalproex (DEPAKOTE SPRINKLE) 125 MG capsule Take 250 mg by mouth 3 (three) times daily. Patient not taking: Reported on 09/08/2021    [provider]  ELIQUIS 2.5 MG TABS tablet Take 2.5 mg by mouth in the morning and at bedtime.    [provider]  LORazepam (ATIVAN) 0.5 MG tablet Take 0.5 mg by mouth 2 (two) times daily as needed for anxiety. Patient not taking: Reported on 09/08/2021    [provider]  Melatonin 10 MG CAPS Take  10 mg by mouth at bedtime.    [provider]  melatonin 3 MG TABS tablet Take 1 tablet (3 mg total) by mouth at bedtime. Patient not taking: Reported on 12/25/2020 08/28/20   Daleen Bo, MD  melatonin 5 MG TABS Take 5 mg by mouth at bedtime. Patient not taking: Reported on 09/08/2021    [provider]  Multiple Vitamins-Minerals (PRESERVISION AREDS 2) CHEW Chew 1 tablet by mouth daily.    [provider]  ondansetron (ZOFRAN) 4 MG tablet Take 4 mg by mouth every 8 (eight) hours as needed for nausea.    [provider]  polyethylene glycol (MIRALAX / GLYCOLAX) 17 g packet Take 17 g by mouth daily as needed (constipation).    [provider]  sertraline (ZOLOFT) 50 MG tablet Take 75 mg by mouth daily. 08/18/21   [provider]  sodium chloride (OCEAN) 0.65 % SOLN nasal spray Place 2 sprays into both nostrils 4 (four) times daily.    [provider]  SORE THROAT RELIEF 15-3.6 MG LOZG Use as directed 1 lozenge in the mouth or throat as needed (for a sore throat).    [provider]  traZODone (DESYREL) 50 MG tablet Take 50 mg by mouth at bedtime. 08/24/21   [provider]  estradiol (ESTRACE) 1 MG tablet Take 1 mg by mouth as needed.   03/16/11  [provider]      Allergies    Memantine hcl-donepezil hcl    Review of Systems   Review of Systems  Physical Exam Updated Vital Signs BP (!) 140/62   Pulse 65   Temp 99.6 F (37.6 C)   Resp 18   SpO2 96%  Physical Exam Vitals and nursing note reviewed.  Constitutional:      General: She is in acute distress.     Appearance: Normal appearance. She is not ill-appearing.  HENT:     Head:     Comments: Tender swollen area to left occiput    Right Ear: External ear normal.     Left Ear: External ear normal.     Nose: Nose normal.     Mouth/Throat:     Mouth: Mucous membranes are moist.     Pharynx: Oropharynx is clear.  Eyes:     Extraocular  Movements: Extraocular movements intact.     Pupils: Pupils are equal, round, and reactive to light.  Neck:     Comments: Trachea midline No posterior neck point tenderness or step-off Patient arrived without collar in place Cardiovascular:     Rate and Rhythm: Normal rate.     Pulses: Normal pulses.  Pulmonary:     Effort: Pulmonary effort is normal.     Breath sounds: Normal breath sounds.  Abdominal:     General: Abdomen is flat. Bowel sounds are normal.     Palpations: Abdomen is soft.  Musculoskeletal:        General: Normal range of motion.  Skin:    General: Skin is warm and dry.     Capillary Refill: Capillary refill takes less than 2 seconds.  Neurological:     General: No focal deficit present.     Mental Status: She is alert.  Psychiatric:        Mood and Affect: Mood normal.     ED Results / Procedures / Treatments   Labs (all labs ordered are listed, but only abnormal results are displayed) Labs Reviewed  COMPREHENSIVE METABOLIC PANEL - Abnormal; Notable for the following components:      Result Value   Glucose, Bld 117 (*)    Total Protein 6.0 (*)    All other components within normal limits  CBC - Abnormal; Notable for the following components:   MCV 100.8 (*)    Platelets 130 (*)    All other components within normal limits  I-STAT CHEM 8, ED - Abnormal; Notable for the following components:   Glucose, Bld 119 (*)    All other components within normal limits  RESP PANEL BY RT-PCR (FLU A&B, COVID) ARPGX2  ETHANOL  PROTIME-INR  URINALYSIS, ROUTINE W REFLEX MICROSCOPIC  LACTIC ACID, PLASMA  SAMPLE TO BLOOD BANK    EKG None  Radiology CT HEAD WO CONTRAST  Result Date: 10/21/2021 CLINICAL DATA:  Head trauma, moderate-severe; Polytrauma, blunt EXAM: CT HEAD WITHOUT CONTRAST CT CERVICAL SPINE WITHOUT CONTRAST TECHNIQUE: Multidetector CT imaging of the head and cervical spine was performed following the standard protocol without intravenous contrast.  Multiplanar CT image reconstructions of the cervical spine were also generated. RADIATION DOSE REDUCTION: This exam was performed according to the departmental dose-optimization program which includes automated exposure control, adjustment of the mA and/or kV according to patient size and/or use of iterative reconstruction technique. COMPARISON:  None Available. FINDINGS: CT HEAD FINDINGS BRAIN: BRAIN Cerebral ventricle sizes are concordant with the degree of cerebral volume loss. Patchy and  confluent areas of decreased attenuation are noted throughout the deep and periventricular white matter of the cerebral hemispheres bilaterally, compatible with chronic microvascular ischemic disease. No evidence of large-territorial acute infarction. No parenchymal hemorrhage. No mass lesion. No extra-axial collection. No mass effect or midline shift. No hydrocephalus. Basilar cisterns are patent. Vascular: No hyperdense vessel. Skull: No acute fracture or focal lesion. Sinuses/Orbits: Paranasal sinuses and mastoid air cells are clear. Bilateral lens replacement. Otherwise the orbits are unremarkable. Other: Left 7 mm parieto-occipital scalp hematoma. CT CERVICAL SPINE FINDINGS Alignment: Normal. Skull base and vertebrae: No acute fracture. No aggressive appearing focal osseous lesion or focal pathologic process. Soft tissues and spinal canal: No prevertebral fluid or swelling. No visible canal hematoma. Upper chest: Biapical pleural/pulmonary scarring. Other: None. IMPRESSION: 1. No acute intracranial abnormality. 2. No acute displaced fracture or traumatic listhesis of the cervical spine. 3. Left 7 mm parieto-occipital scalp hematoma. Electronically Signed   By: Iven Finn M.D.   On: 10/21/2021 19:49   CT CERVICAL SPINE WO CONTRAST  Result Date: 10/21/2021 CLINICAL DATA:  Head trauma, moderate-severe; Polytrauma, blunt EXAM: CT HEAD WITHOUT CONTRAST CT CERVICAL SPINE WITHOUT CONTRAST TECHNIQUE: Multidetector CT  imaging of the head and cervical spine was performed following the standard protocol without intravenous contrast. Multiplanar CT image reconstructions of the cervical spine were also generated. RADIATION DOSE REDUCTION: This exam was performed according to the departmental dose-optimization program which includes automated exposure control, adjustment of the mA and/or kV according to patient size and/or use of iterative reconstruction technique. COMPARISON:  None Available. FINDINGS: CT HEAD FINDINGS BRAIN: BRAIN Cerebral ventricle sizes are concordant with the degree of cerebral volume loss. Patchy and confluent areas of decreased attenuation are noted throughout the deep and periventricular white matter of the cerebral hemispheres bilaterally, compatible with chronic microvascular ischemic disease. No evidence of large-territorial acute infarction. No parenchymal hemorrhage. No mass lesion. No extra-axial collection. No mass effect or midline shift. No hydrocephalus. Basilar cisterns are patent. Vascular: No hyperdense vessel. Skull: No acute fracture or focal lesion. Sinuses/Orbits: Paranasal sinuses and mastoid air cells are clear. Bilateral lens replacement. Otherwise the orbits are unremarkable. Other: Left 7 mm parieto-occipital scalp hematoma. CT CERVICAL SPINE FINDINGS Alignment: Normal. Skull base and vertebrae: No acute fracture. No aggressive appearing focal osseous lesion or focal pathologic process. Soft tissues and spinal canal: No prevertebral fluid or swelling. No visible canal hematoma. Upper chest: Biapical pleural/pulmonary scarring. Other: None. IMPRESSION: 1. No acute intracranial abnormality. 2. No acute displaced fracture or traumatic listhesis of the cervical spine. 3. Left 7 mm parieto-occipital scalp hematoma. Electronically Signed   By: Iven Finn M.D.   On: 10/21/2021 19:49   DG Chest Port 1 View  Result Date: 10/21/2021 CLINICAL DATA:  Trauma. EXAM: PORTABLE CHEST 1 VIEW  COMPARISON:  September 08, 2021 FINDINGS: The heart size and mediastinal contours are within normal limits. Both lungs are clear. The visualized skeletal structures are unremarkable. IMPRESSION: No active disease. Electronically Signed   By: Dorise Bullion III M.D.   On: 10/21/2021 19:41   DG Pelvis Portable  Result Date: 10/21/2021 CLINICAL DATA:  Trauma EXAM: PORTABLE PELVIS 1-2 VIEWS COMPARISON:  Pelvis x-Daemyn Gariepy 09/08/2021 FINDINGS: There is no evidence of pelvic fracture or diastasis. No pelvic bone lesions are seen. IMPRESSION: Negative. Electronically Signed   By: Ronney Asters M.D.   On: 10/21/2021 19:41    Procedures Procedures    Medications Ordered in ED Medications  fentaNYL (SUBLIMAZE) injection 12.5 mcg (12.5 mcg  Intravenous Given 10/21/21 1919)  midazolam (VERSED) injection 2 mg (2 mg Intravenous Given 10/21/21 1958)    ED Course/ Medical Decision Making/ A&P Clinical Course as of 10/21/21 2100  Fri Oct 21, 2021  2005 CT head reviewed interpreted no evidence of acute intracranial abnormality and no evidence of acute displaced fracture Extra-axial hematoma noted [DR]  2006 Chest x-Ajanay Farve and pelvis x-Nashiya Disbrow reviewed interpreted no evidence of acute abnormalities noted [DR]  2006 I-STAT reviewed interpreted evidence of acute abnormality noted [DR]    Clinical Course User Index [DR] Pattricia Boss, MD                           Medical Decision Making 88 year old female with dementia who presents today after fall.  Per report this was a witnessed fall and appeared to be a mechanical fall. She was evaluated here in the ED with imaging and labs per trauma protocol Chest x-Velmer Broadfoot and pelvis done at bedside shows no evidence of acute abnormality CT of head obtained due to trauma to head with patient had been on blood thinners Evidence of acute intracranial abnormality was noted Cervical spine CT was obtained without any evidence of acute fracture Attempted to contact patient's daughter [name] but no answer Patient evaluated with labs notes acute metabolic abnormality. Patient appears stable for discharge back to her facility  Amount and/or Complexity of Data Reviewed Labs: ordered. Decision-making details documented in ED Course. Radiology: ordered and independent interpretation performed. Decision-making details documented in ED Course.  Risk Prescription drug management.  Critical Care Total time providing critical care: 30 minutes           Final Clinical Impression(s) / ED Diagnoses Final diagnoses:  Fall, initial encounter  Contusion of scalp, initial encounter    Rx / DC Orders ED Discharge Orders     None         Pattricia Boss, MD 10/21/21 2101

## 2021-10-21 NOTE — ED Notes (Signed)
Daughter Suanne Marker [phone] wants an update immediately

## 2021-10-21 NOTE — Discharge Instructions (Addendum)
Patient had head CT done here in the emergency department.  There is no evidence of bleeding in her brain.  However if patient begins to act abnormally with complaints of increasing headache, decreased responsiveness, will new weakness she should be reevaluated as she is noted to be on blood thinners.

## 2021-10-21 NOTE — ED Notes (Signed)
Radiology at bedside

## 2021-10-21 NOTE — ED Triage Notes (Signed)
BIB [facility] from [facility]. Pt stood up from couch stumbled and fell, per staff no LOC.[name] pt has a hx of alzheimers, dementia, and A-fib. Pt is on Eliquis. Pt didn't c/o anything with EMS. C-spine not maintained. Per staff to EMS pt was at her base line.

## 2021-10-21 NOTE — ED Notes (Signed)
Transported to ct 

## 2021-10-21 NOTE — ED Notes (Signed)
C-collar placed.

## 2021-11-06 ENCOUNTER — Emergency Department (HOSPITAL_COMMUNITY)
Admission: EM | Admit: 2021-11-06 | Discharge: 2021-11-06 | Disposition: A | Discharge status: Home / Self Care | Payer: Medicare Other | Attending: Emergency Medicine | Admitting: Emergency Medicine

## 2021-11-06 ENCOUNTER — Emergency Department (HOSPITAL_COMMUNITY): Payer: Medicare Other

## 2021-11-06 DIAGNOSIS — R531 Weakness: Secondary | ICD-10-CM | POA: Insufficient documentation

## 2021-11-06 DIAGNOSIS — Z7409 Other reduced mobility: Secondary | ICD-10-CM | POA: Diagnosis not present

## 2021-11-06 DIAGNOSIS — Z7901 Long term (current) use of anticoagulants: Secondary | ICD-10-CM | POA: Diagnosis not present

## 2021-11-06 LAB — CBC WITH DIFFERENTIAL/PLATELET
Abs Immature Granulocytes: 0.02 10*3/uL (ref 0.00–0.07)
Basophils Absolute: 0 10*3/uL (ref 0.0–0.1)
Basophils Relative: 0 %
Eosinophils Absolute: 0.1 10*3/uL (ref 0.0–0.5)
Eosinophils Relative: 1 %
HCT: 46.4 % — ABNORMAL HIGH (ref 36.0–46.0)
Hemoglobin: 14.6 g/dL (ref 12.0–15.0)
Immature Granulocytes: 0 %
Lymphocytes Relative: 15 %
Lymphs Abs: 1.1 10*3/uL (ref 0.7–4.0)
MCH: 32.1 pg (ref 26.0–34.0)
MCHC: 31.5 g/dL (ref 30.0–36.0)
MCV: 102 fL — ABNORMAL HIGH (ref 80.0–100.0)
Monocytes Absolute: 0.8 10*3/uL (ref 0.1–1.0)
Monocytes Relative: 10 %
Neutro Abs: 5.7 10*3/uL (ref 1.7–7.7)
Neutrophils Relative %: 74 %
Platelets: DECREASED 10*3/uL (ref 150–400)
RBC: 4.55 MIL/uL (ref 3.87–5.11)
RDW: 12.7 % (ref 11.5–15.5)
WBC: 7.8 10*3/uL (ref 4.0–10.5)
nRBC: 0 % (ref 0.0–0.2)

## 2021-11-06 LAB — COMPREHENSIVE METABOLIC PANEL
ALT: 13 U/L (ref 0–44)
AST: 22 U/L (ref 15–41)
Albumin: 3.8 g/dL (ref 3.5–5.0)
Alkaline Phosphatase: 48 U/L (ref 38–126)
Anion gap: 9 (ref 5–15)
BUN: 19 mg/dL (ref 8–23)
CO2: 24 mmol/L (ref 22–32)
Calcium: 9.2 mg/dL (ref 8.9–10.3)
Chloride: 108 mmol/L (ref 98–111)
Creatinine, Ser: 0.65 mg/dL (ref 0.44–1.00)
GFR, Estimated: >60 mL/min (ref >60)
Glucose, Bld: 105 mg/dL — ABNORMAL HIGH (ref 70–99)
Potassium: 4.6 mmol/L (ref 3.5–5.1)
Sodium: 141 mmol/L (ref 135–145)
Total Bilirubin: 0.9 mg/dL (ref 0.3–1.2)
Total Protein: 6.6 g/dL (ref 6.5–8.1)

## 2021-11-06 LAB — URINALYSIS, ROUTINE W REFLEX MICROSCOPIC
Bilirubin Urine: NEGATIVE
Glucose, UA: NEGATIVE mg/dL
Hgb urine dipstick: NEGATIVE
Ketones, ur: 5 mg/dL — AB
Leukocytes,Ua: NEGATIVE
Nitrite: NEGATIVE
Protein, ur: NEGATIVE mg/dL
Specific Gravity, Urine: 1.017 (ref 1.005–1.030)
pH: 7 (ref 5.0–8.0)

## 2021-11-06 MED ORDER — SODIUM CHLORIDE 0.9 % IV BOLUS
1000.0000 mL | Freq: Once | INTRAVENOUS | Status: AC
  Administered 2021-11-06: 1000 mL via INTRAVENOUS

## 2021-11-06 NOTE — ED Triage Notes (Signed)
Pt arrived by EMS from [facility] with reduced mobility. Usually walks with a walker and now unable to ambulate. A&Ox1 which is baseline.

## 2021-11-06 NOTE — ED Provider Notes (Signed)
Supervise resident visit.  Patient here with generalized weakness.  Normal vitals.  No fever.  Lives at memory care unit at [facility].  Normally very ambulatory but today has not been walking.  She moves all of her extremities.  She does not seem to follow commands which seems to be baseline.  She does not appear to have any facial droop or obvious stroke symptoms on exam.  Overall differential is wide could be dementia related process versus infectious process versus electrolyte abnormality, AKI.  She has not seen a fever.  She is not on a blood thinner anymore.  Does have a history of A-fib.  Does have history of falls.  No recent falls.  She does not appear to be having any discomfort when I range her arms and legs.  We will get head CT, neck CT, chest x-ray, pelvic x-ray.  We will check basic labs including urinalysis.  Overall she appears to be doing well.  Not sure if her if she tried to get up walking around is more behavioral.  We will continue to help with medical decision making, see my resident note for further results, evaluation, disposition of the patient.  We have talked with family.  This chart was dictated using voice recognition software.  Despite best efforts to proofread,  errors can occur which can change the documentation meaning.    Lennice Sites, DO 11/06/21 1254

## 2021-11-06 NOTE — ED Provider Notes (Signed)
[facility] EMERGENCY DEPARTMENT Provider Note   CSN: [id number] Arrival date & time: 11/06/21  1201     History  Chief Complaint  Patient presents with   Weakness   [name] is a 88 y.o. female PMH dementia who is presenting with decreased mobility.  Patient brought in by EMS, treated for the history.  They report that normally patient is ambulatory with a rolling walker.  However, today the patient has been sitting in a chair and when staff attempted to have her walk, she seemed unsteady on her feet and weaker than normal.  Patient unable to contribute to history, but is at her normal baseline mental status per EMS.  Per patient's daughter, patient is usually fairly ambulatory with rolling walker at baseline, but will also have days where she does not move as much.  Home Medications Prior to Admission medications   Medication Sig Start Date End Date Taking? Authorizing Provider  acetaminophen (TYLENOL) 325 MG tablet Take 325 mg by mouth every 6 (six) hours as needed (pain).    [provider]  alendronate (FOSAMAX) 70 MG tablet Take 70 mg by mouth once a week. Take with a full glass of water on an empty stomach.    [provider]  busPIRone (BUSPAR) 5 MG tablet Take 5 mg by mouth 3 (three) times daily. 08/17/21   [provider]  cephALEXin (KEFLEX) 250 MG capsule Take 1 capsule (250 mg total) by mouth 4 (four) times daily. Patient not taking: Reported on 09/08/2021 08/28/20   Daleen Bo, MD  Cholecalciferol (VITAMIN D-3) 25 MCG (1000 UT) CAPS Take 1,000 Units by mouth daily.    [provider]  DEPAKOTE 250 MG DR tablet Take 250 mg by mouth 2 (two) times daily. 08/23/21   [provider]  divalproex (DEPAKOTE SPRINKLE) 125 MG capsule Take 250 mg by mouth 3 (three) times daily. Patient not taking: Reported on 09/08/2021    [provider]  ELIQUIS 2.5 MG TABS tablet Take 2.5 mg by mouth in the morning and  at bedtime.    [provider]  LORazepam (ATIVAN) 0.5 MG tablet Take 0.5 mg by mouth 2 (two) times daily as needed for anxiety. Patient not taking: Reported on 09/08/2021    [provider]  Melatonin 10 MG CAPS Take 10 mg by mouth at bedtime.    [provider]  melatonin 3 MG TABS tablet Take 1 tablet (3 mg total) by mouth at bedtime. Patient not taking: Reported on 12/25/2020 08/28/20   Daleen Bo, MD  melatonin 5 MG TABS Take 5 mg by mouth at bedtime. Patient not taking: Reported on 09/08/2021    [provider]  Multiple Vitamins-Minerals (PRESERVISION AREDS 2) CHEW Chew 1 tablet by mouth daily.    [provider]  ondansetron (ZOFRAN) 4 MG tablet Take 4 mg by mouth every 8 (eight) hours as needed for nausea.    [provider]  polyethylene glycol (MIRALAX / GLYCOLAX) 17 g packet Take 17 g by mouth daily as needed (constipation).    [provider]  sertraline (ZOLOFT) 50 MG tablet Take 75 mg by mouth daily. 08/18/21   [provider]  sodium chloride (OCEAN) 0.65 % SOLN nasal spray Place 2 sprays into both nostrils 4 (four) times daily.    [provider]  SORE THROAT RELIEF 15-3.6 MG LOZG Use as directed 1 lozenge in the mouth or throat as needed (for a sore throat).  [provider]  traZODone (DESYREL) 50 MG tablet Take 50 mg by mouth at bedtime. 08/24/21   [provider]  estradiol (ESTRACE) 1 MG tablet Take 1 mg by mouth as needed.   03/16/11  [provider]      Allergies    Memantine hcl-donepezil hcl    Review of Systems   Review of Systems  Neurological:  Positive for weakness.   Physical Exam Updated Vital Signs BP (!) 144/84 (BP Location: Left Arm)   Pulse 80   Temp 98.3 F (36.8 C)   Ht '5\' 2"'$  (1.575 m)   Wt 50 kg   SpO2 99%   BMI 20.16 kg/m   Physical Exam Vitals and nursing note reviewed.  Constitutional:      General: She is not in acute distress.     Appearance: Normal appearance. She is well-developed. She is not ill-appearing or diaphoretic.  HENT:     Head: Normocephalic and atraumatic.     Right Ear: External ear normal.     Left Ear: External ear normal.     Nose: Nose normal.     Mouth/Throat:     Mouth: Mucous membranes are moist.  Eyes:     Pupils: Pupils are equal, round, and reactive to light.  Cardiovascular:     Rate and Rhythm: Normal rate and regular rhythm.     Heart sounds: No murmur heard. Pulmonary:     Effort: Pulmonary effort is normal. No respiratory distress.     Breath sounds: Normal breath sounds. No wheezing.  Abdominal:     General: There is no distension.     Palpations: Abdomen is soft.     Tenderness: There is no abdominal tenderness. There is no guarding.  Musculoskeletal:        General: No tenderness.     Cervical back: Neck supple. No tenderness.     Right lower leg: No edema.     Left lower leg: No edema.     Comments: Full range of motion of all extremities without obvious pain.  Skin:    General: Skin is warm and dry.  Neurological:     Mental Status: She is alert.     Comments: Patient at baseline per facility.  Patient will say hello, but will not follow commands.    ED Results / Procedures / Treatments   Labs (all labs ordered are listed, but only abnormal results are displayed) Labs Reviewed  CBC WITH DIFFERENTIAL/PLATELET - Abnormal; Notable for the following components:      Result Value   HCT 46.4 (*)    MCV 102.0 (*)    All other components within normal limits  COMPREHENSIVE METABOLIC PANEL - Abnormal; Notable for the following components:   Glucose, Bld 105 (*)    All other components within normal limits  URINALYSIS, ROUTINE W REFLEX MICROSCOPIC    EKG EKG Interpretation  Date/Time:  Sunday November 06 2021 13:20:37 EDT Ventricular Rate:  79 PR Interval:    QRS Duration: 112 QT Interval:  378 QTC Calculation: 433 R Axis:   70 Text Interpretation: Accelerated  Junctional rhythm with Premature ventricular complexes or Fusion complexes Left ventricular hypertrophy with repolarization abnormality ( Cornell product ) Anterolateral infarct , age undetermined Abnormal ECG When compared with ECG of 08-Sep-2021 07:52, PREVIOUS ECG IS PRESENT Confirmed by Lennice Sites [phone]) on 11/06/2021 1:27:52 PM  Radiology DG Pelvis 1-2 Views  Result Date: 11/06/2021 CLINICAL DATA:  Inability to ambulate. EXAM: PELVIS -  1-2 VIEW COMPARISON:  None Available. FINDINGS: There is no evidence of pelvic fracture or diastasis. No pelvic bone lesions are seen. IMPRESSION: Negative. Electronically Signed   By: Marlaine Hind M.D.   On: 11/06/2021 13:08   DG Chest 1 View  Result Date: 11/06/2021 CLINICAL DATA:  Weakness and inability to ambulate. EXAM: CHEST  1 VIEW COMPARISON:  10/21/2021 FINDINGS: Patient is partially rotated to the left. The heart size and mediastinal contours are within normal limits. Aortic atherosclerotic calcification incidentally noted. Both lungs are clear. IMPRESSION: No active disease. Electronically Signed   By: Marlaine Hind M.D.   On: 11/06/2021 13:08   CT Cervical Spine Wo Contrast  Result Date: 11/06/2021 CLINICAL DATA:  Trauma, reduced mobility EXAM: CT CERVICAL SPINE WITHOUT CONTRAST TECHNIQUE: Multidetector CT imaging of the cervical spine was performed without intravenous contrast. Multiplanar CT image reconstructions were also generated. RADIATION DOSE REDUCTION: This exam was performed according to the departmental dose-optimization program which includes automated exposure control, adjustment of the mA and/or kV according to patient size and/or use of iterative reconstruction technique. COMPARISON:  10/21/2021 FINDINGS: Alignment: Alignment of posterior margins of vertebral bodies is unremarkable. Skull base and vertebrae: No recent fracture is seen in cervical spine. Minimal decrease in height of upper endplate of body of T2 vertebra most likely is old  mild compression. Soft tissues and spinal canal: There is no central spinal stenosis. Disc levels: There is encroachment of neural foramina from C3-C7 levels. Upper chest: Unremarkable. Other: There is inhomogeneous attenuation and thyroid. IMPRESSION: No recent fracture is seen cervical spine. Cervical spondylosis with encroachment of neural foramina from C3-C7 levels. Minimal decrease in height of upper endplate of body of T2 vertebra most likely is old mild compression Electronically Signed   By: Elmer Picker M.D.   On: 11/06/2021 13:04   CT Head Wo Contrast  Result Date: 11/06/2021 CLINICAL DATA:  Weakness, reduced mobility EXAM: CT HEAD WITHOUT CONTRAST TECHNIQUE: Contiguous axial images were obtained from the base of the skull through the vertex without intravenous contrast. RADIATION DOSE REDUCTION: This exam was performed according to the departmental dose-optimization program which includes automated exposure control, adjustment of the mA and/or kV according to patient size and/or use of iterative reconstruction technique. COMPARISON:  10/21/2021 FINDINGS: Brain: No acute intracranial findings are seen. Ventricles are prominent. Cortical sulci are prominent. There is decreased density in periventricular and subcortical white matter. Vascular: Scattered arterial calcifications are seen. Skull: No fracture is seen. Sinuses/Orbits: No acute findings are seen. Other: No significant interval changes are noted. IMPRESSION: No acute intracranial findings are seen noncontrast CT brain. Central and cortical atrophy. Small-vessel disease. Electronically Signed   By: Elmer Picker M.D.   On: 11/06/2021 12:58    Procedures Procedures   Medications Ordered in ED Medications - No data to display  ED Course/ Medical Decision Making/ A&P                           Medical Decision Making Amount and/or Complexity of Data Reviewed Labs: ordered. Radiology: ordered.   ISHANA BLADES is a 88  y.o. female with significant PMHx dementia who presented to the ED with decreased mobility.  Vitals at presentation within normal limits.  Patient is hemodynamically stable, afebrile, satting well on room air.  Patient on mental baseline per EMS and daughter, who is at bedside.  Physical exam reassuring.  Normal heart sounds.  Lungs clear to auscultation bilaterally.  Abdomen is soft, nontender to palpation.  No pitting edema.  Initial differential includes but is not limited to: Dementia, dehydration, infection (URI, UTI, pneumonia), ICH  Lab work obtained, resulted notable for CBC without leukocytosis or anemia.  CMP without significant derangement UA obtained, resulted not consistent with infectious etiology.  Chest x-ray without active disease.  No acute findings seen on CT head, CT cervical spine, pelvic x-ray.  EKG obtained, demonstrates sinus rhythm, ventricular rate 79 bpm.  No evidence of abnormal intervals or acute ischemia.  Interpreted by myself and my attending.  Interventions include IV fluid bolus  Most likely cause of today's presentation is dementia with waxing/waning mental status, particularly given daughter's history of this happening before and patient's reassuring vitals, physical exam, and study results.  The patient is safe and stable for discharge at this time with return precautions provided and a plan for follow-up care in place as needed.  The plan for this patient was discussed with Dr. Ronnald Nian, who voiced agreement and who oversaw evaluation and treatment of this patient.    Final Clinical Impression(s) / ED Diagnoses Final diagnoses:  Weakness    Rx / DC Orders ED Discharge Orders     None         Donna Million, MD 11/06/21 [name], [place], [name] 11/06/21 1430

## 2021-11-06 NOTE — Discharge Instructions (Signed)
You were seen today for decreased mobility/activity.  Your blood work and imaging studies did not show any concerning abnormalities.  Please follow-up with your primary care doctor in the next 2 to 3 days regarding today's visit and your symptoms. Please return to the emergency department if you experience any chest pain, difficulty breathing, abdominal pain, altered mental status, severe fever, or any other concerning symptom.  Thank you for allowing [place] to participate in your care.

## 2022-02-10 DEATH — deceased
